# Patient Record
Sex: Female | Born: 1945 | Race: White | Hispanic: No | Marital: Married | Smoking: Former smoker
Health system: Southern US, Community
[De-identification: ages and names within clinical notes are randomized; demographics above are authoritative.]

## PROBLEM LIST (undated history)

## (undated) DIAGNOSIS — D6861 Antiphospholipid syndrome: Secondary | ICD-10-CM

## (undated) DIAGNOSIS — Z8616 Personal history of COVID-19: Secondary | ICD-10-CM

## (undated) DIAGNOSIS — C9590 Leukemia, unspecified not having achieved remission: Secondary | ICD-10-CM

## (undated) HISTORY — DX: Personal history of COVID-19: Z86.16

## (undated) HISTORY — PX: APPENDECTOMY: SHX54

## (undated) HISTORY — PX: TONSILLECTOMY: SHX5217

## (undated) HISTORY — DX: Antiphospholipid syndrome: D68.61

## (undated) HISTORY — PX: CATARACT EXTRACTION: SUR2

## (undated) HISTORY — DX: Leukemia, unspecified not having achieved remission: C95.90

---

## 1997-07-14 ENCOUNTER — Other Ambulatory Visit: Admission: RE | Admit: 1997-07-14 | Discharge: 1997-07-14 | Payer: Self-pay | Admitting: *Deleted

## 2006-03-31 HISTORY — PX: COLON RESECTION: SHX5231

## 2006-10-30 ENCOUNTER — Ambulatory Visit: Payer: Self-pay | Admitting: Oncology

## 2015-01-02 DIAGNOSIS — Z23 Encounter for immunization: Secondary | ICD-10-CM | POA: Diagnosis not present

## 2015-01-31 DIAGNOSIS — M810 Age-related osteoporosis without current pathological fracture: Secondary | ICD-10-CM | POA: Diagnosis not present

## 2015-01-31 DIAGNOSIS — R7301 Impaired fasting glucose: Secondary | ICD-10-CM | POA: Diagnosis not present

## 2015-01-31 DIAGNOSIS — I10 Essential (primary) hypertension: Secondary | ICD-10-CM | POA: Diagnosis not present

## 2015-01-31 DIAGNOSIS — E782 Mixed hyperlipidemia: Secondary | ICD-10-CM | POA: Diagnosis not present

## 2015-01-31 DIAGNOSIS — Z6831 Body mass index (BMI) 31.0-31.9, adult: Secondary | ICD-10-CM | POA: Diagnosis not present

## 2015-01-31 DIAGNOSIS — Z23 Encounter for immunization: Secondary | ICD-10-CM | POA: Diagnosis not present

## 2015-01-31 DIAGNOSIS — Z1231 Encounter for screening mammogram for malignant neoplasm of breast: Secondary | ICD-10-CM | POA: Diagnosis not present

## 2015-05-18 DIAGNOSIS — J018 Other acute sinusitis: Secondary | ICD-10-CM | POA: Diagnosis not present

## 2015-06-04 DIAGNOSIS — M5442 Lumbago with sciatica, left side: Secondary | ICD-10-CM | POA: Diagnosis not present

## 2015-06-04 DIAGNOSIS — M9904 Segmental and somatic dysfunction of sacral region: Secondary | ICD-10-CM | POA: Diagnosis not present

## 2015-06-04 DIAGNOSIS — M5136 Other intervertebral disc degeneration, lumbar region: Secondary | ICD-10-CM | POA: Diagnosis not present

## 2015-06-04 DIAGNOSIS — M9903 Segmental and somatic dysfunction of lumbar region: Secondary | ICD-10-CM | POA: Diagnosis not present

## 2015-06-06 DIAGNOSIS — I10 Essential (primary) hypertension: Secondary | ICD-10-CM | POA: Diagnosis not present

## 2015-06-06 DIAGNOSIS — R7301 Impaired fasting glucose: Secondary | ICD-10-CM | POA: Diagnosis not present

## 2015-06-06 DIAGNOSIS — M5442 Lumbago with sciatica, left side: Secondary | ICD-10-CM | POA: Diagnosis not present

## 2015-06-06 DIAGNOSIS — J3089 Other allergic rhinitis: Secondary | ICD-10-CM | POA: Diagnosis not present

## 2015-06-06 DIAGNOSIS — M5136 Other intervertebral disc degeneration, lumbar region: Secondary | ICD-10-CM | POA: Diagnosis not present

## 2015-06-06 DIAGNOSIS — M545 Low back pain: Secondary | ICD-10-CM | POA: Diagnosis not present

## 2015-06-06 DIAGNOSIS — M9903 Segmental and somatic dysfunction of lumbar region: Secondary | ICD-10-CM | POA: Diagnosis not present

## 2015-06-06 DIAGNOSIS — E782 Mixed hyperlipidemia: Secondary | ICD-10-CM | POA: Diagnosis not present

## 2015-06-06 DIAGNOSIS — M9904 Segmental and somatic dysfunction of sacral region: Secondary | ICD-10-CM | POA: Diagnosis not present

## 2015-06-08 DIAGNOSIS — M5442 Lumbago with sciatica, left side: Secondary | ICD-10-CM | POA: Diagnosis not present

## 2015-06-08 DIAGNOSIS — M9904 Segmental and somatic dysfunction of sacral region: Secondary | ICD-10-CM | POA: Diagnosis not present

## 2015-06-08 DIAGNOSIS — M9903 Segmental and somatic dysfunction of lumbar region: Secondary | ICD-10-CM | POA: Diagnosis not present

## 2015-06-08 DIAGNOSIS — M5136 Other intervertebral disc degeneration, lumbar region: Secondary | ICD-10-CM | POA: Diagnosis not present

## 2015-06-11 DIAGNOSIS — M5136 Other intervertebral disc degeneration, lumbar region: Secondary | ICD-10-CM | POA: Diagnosis not present

## 2015-06-11 DIAGNOSIS — M9904 Segmental and somatic dysfunction of sacral region: Secondary | ICD-10-CM | POA: Diagnosis not present

## 2015-06-11 DIAGNOSIS — M9903 Segmental and somatic dysfunction of lumbar region: Secondary | ICD-10-CM | POA: Diagnosis not present

## 2015-06-11 DIAGNOSIS — M5442 Lumbago with sciatica, left side: Secondary | ICD-10-CM | POA: Diagnosis not present

## 2015-06-13 DIAGNOSIS — M9904 Segmental and somatic dysfunction of sacral region: Secondary | ICD-10-CM | POA: Diagnosis not present

## 2015-06-13 DIAGNOSIS — M5136 Other intervertebral disc degeneration, lumbar region: Secondary | ICD-10-CM | POA: Diagnosis not present

## 2015-06-13 DIAGNOSIS — M9903 Segmental and somatic dysfunction of lumbar region: Secondary | ICD-10-CM | POA: Diagnosis not present

## 2015-06-13 DIAGNOSIS — M5442 Lumbago with sciatica, left side: Secondary | ICD-10-CM | POA: Diagnosis not present

## 2015-06-15 DIAGNOSIS — M5136 Other intervertebral disc degeneration, lumbar region: Secondary | ICD-10-CM | POA: Diagnosis not present

## 2015-06-15 DIAGNOSIS — M9904 Segmental and somatic dysfunction of sacral region: Secondary | ICD-10-CM | POA: Diagnosis not present

## 2015-06-15 DIAGNOSIS — M5442 Lumbago with sciatica, left side: Secondary | ICD-10-CM | POA: Diagnosis not present

## 2015-06-15 DIAGNOSIS — M9903 Segmental and somatic dysfunction of lumbar region: Secondary | ICD-10-CM | POA: Diagnosis not present

## 2015-06-18 DIAGNOSIS — M5136 Other intervertebral disc degeneration, lumbar region: Secondary | ICD-10-CM | POA: Diagnosis not present

## 2015-06-18 DIAGNOSIS — M9903 Segmental and somatic dysfunction of lumbar region: Secondary | ICD-10-CM | POA: Diagnosis not present

## 2015-06-18 DIAGNOSIS — M5442 Lumbago with sciatica, left side: Secondary | ICD-10-CM | POA: Diagnosis not present

## 2015-06-18 DIAGNOSIS — M9904 Segmental and somatic dysfunction of sacral region: Secondary | ICD-10-CM | POA: Diagnosis not present

## 2015-06-22 DIAGNOSIS — M9904 Segmental and somatic dysfunction of sacral region: Secondary | ICD-10-CM | POA: Diagnosis not present

## 2015-06-22 DIAGNOSIS — M5136 Other intervertebral disc degeneration, lumbar region: Secondary | ICD-10-CM | POA: Diagnosis not present

## 2015-06-22 DIAGNOSIS — M9903 Segmental and somatic dysfunction of lumbar region: Secondary | ICD-10-CM | POA: Diagnosis not present

## 2015-06-22 DIAGNOSIS — M5442 Lumbago with sciatica, left side: Secondary | ICD-10-CM | POA: Diagnosis not present

## 2015-06-25 DIAGNOSIS — M9903 Segmental and somatic dysfunction of lumbar region: Secondary | ICD-10-CM | POA: Diagnosis not present

## 2015-06-25 DIAGNOSIS — M9904 Segmental and somatic dysfunction of sacral region: Secondary | ICD-10-CM | POA: Diagnosis not present

## 2015-06-25 DIAGNOSIS — M5136 Other intervertebral disc degeneration, lumbar region: Secondary | ICD-10-CM | POA: Diagnosis not present

## 2015-06-25 DIAGNOSIS — M5442 Lumbago with sciatica, left side: Secondary | ICD-10-CM | POA: Diagnosis not present

## 2015-06-27 DIAGNOSIS — M9904 Segmental and somatic dysfunction of sacral region: Secondary | ICD-10-CM | POA: Diagnosis not present

## 2015-06-27 DIAGNOSIS — M5136 Other intervertebral disc degeneration, lumbar region: Secondary | ICD-10-CM | POA: Diagnosis not present

## 2015-06-27 DIAGNOSIS — M9903 Segmental and somatic dysfunction of lumbar region: Secondary | ICD-10-CM | POA: Diagnosis not present

## 2015-06-27 DIAGNOSIS — M5442 Lumbago with sciatica, left side: Secondary | ICD-10-CM | POA: Diagnosis not present

## 2015-06-29 DIAGNOSIS — M9903 Segmental and somatic dysfunction of lumbar region: Secondary | ICD-10-CM | POA: Diagnosis not present

## 2015-06-29 DIAGNOSIS — M5442 Lumbago with sciatica, left side: Secondary | ICD-10-CM | POA: Diagnosis not present

## 2015-06-29 DIAGNOSIS — M9904 Segmental and somatic dysfunction of sacral region: Secondary | ICD-10-CM | POA: Diagnosis not present

## 2015-06-29 DIAGNOSIS — M5136 Other intervertebral disc degeneration, lumbar region: Secondary | ICD-10-CM | POA: Diagnosis not present

## 2015-07-04 DIAGNOSIS — M5442 Lumbago with sciatica, left side: Secondary | ICD-10-CM | POA: Diagnosis not present

## 2015-07-04 DIAGNOSIS — M5136 Other intervertebral disc degeneration, lumbar region: Secondary | ICD-10-CM | POA: Diagnosis not present

## 2015-07-04 DIAGNOSIS — M9903 Segmental and somatic dysfunction of lumbar region: Secondary | ICD-10-CM | POA: Diagnosis not present

## 2015-07-04 DIAGNOSIS — M9904 Segmental and somatic dysfunction of sacral region: Secondary | ICD-10-CM | POA: Diagnosis not present

## 2015-07-09 DIAGNOSIS — M5136 Other intervertebral disc degeneration, lumbar region: Secondary | ICD-10-CM | POA: Diagnosis not present

## 2015-07-09 DIAGNOSIS — M9903 Segmental and somatic dysfunction of lumbar region: Secondary | ICD-10-CM | POA: Diagnosis not present

## 2015-07-09 DIAGNOSIS — M9904 Segmental and somatic dysfunction of sacral region: Secondary | ICD-10-CM | POA: Diagnosis not present

## 2015-07-09 DIAGNOSIS — M5442 Lumbago with sciatica, left side: Secondary | ICD-10-CM | POA: Diagnosis not present

## 2015-07-11 DIAGNOSIS — M5442 Lumbago with sciatica, left side: Secondary | ICD-10-CM | POA: Diagnosis not present

## 2015-07-11 DIAGNOSIS — M5136 Other intervertebral disc degeneration, lumbar region: Secondary | ICD-10-CM | POA: Diagnosis not present

## 2015-07-11 DIAGNOSIS — M9903 Segmental and somatic dysfunction of lumbar region: Secondary | ICD-10-CM | POA: Diagnosis not present

## 2015-07-11 DIAGNOSIS — M9904 Segmental and somatic dysfunction of sacral region: Secondary | ICD-10-CM | POA: Diagnosis not present

## 2015-10-11 DIAGNOSIS — H6983 Other specified disorders of Eustachian tube, bilateral: Secondary | ICD-10-CM | POA: Diagnosis not present

## 2015-10-11 DIAGNOSIS — R7301 Impaired fasting glucose: Secondary | ICD-10-CM | POA: Diagnosis not present

## 2015-10-11 DIAGNOSIS — E782 Mixed hyperlipidemia: Secondary | ICD-10-CM | POA: Diagnosis not present

## 2015-10-11 DIAGNOSIS — I10 Essential (primary) hypertension: Secondary | ICD-10-CM | POA: Diagnosis not present

## 2016-01-07 DIAGNOSIS — Z23 Encounter for immunization: Secondary | ICD-10-CM | POA: Diagnosis not present

## 2016-02-19 DIAGNOSIS — M791 Myalgia: Secondary | ICD-10-CM | POA: Diagnosis not present

## 2016-02-19 DIAGNOSIS — R7301 Impaired fasting glucose: Secondary | ICD-10-CM | POA: Diagnosis not present

## 2016-02-19 DIAGNOSIS — Z1211 Encounter for screening for malignant neoplasm of colon: Secondary | ICD-10-CM | POA: Diagnosis not present

## 2016-02-19 DIAGNOSIS — E782 Mixed hyperlipidemia: Secondary | ICD-10-CM | POA: Diagnosis not present

## 2016-02-19 DIAGNOSIS — I10 Essential (primary) hypertension: Secondary | ICD-10-CM | POA: Diagnosis not present

## 2016-02-19 DIAGNOSIS — C9110 Chronic lymphocytic leukemia of B-cell type not having achieved remission: Secondary | ICD-10-CM | POA: Diagnosis not present

## 2016-02-19 DIAGNOSIS — M790 Rheumatism, unspecified: Secondary | ICD-10-CM | POA: Diagnosis not present

## 2016-02-19 DIAGNOSIS — Z23 Encounter for immunization: Secondary | ICD-10-CM | POA: Diagnosis not present

## 2016-07-14 DIAGNOSIS — Z1231 Encounter for screening mammogram for malignant neoplasm of breast: Secondary | ICD-10-CM | POA: Diagnosis not present

## 2016-07-14 DIAGNOSIS — N951 Menopausal and female climacteric states: Secondary | ICD-10-CM | POA: Diagnosis not present

## 2016-07-14 DIAGNOSIS — I10 Essential (primary) hypertension: Secondary | ICD-10-CM | POA: Diagnosis not present

## 2016-07-14 DIAGNOSIS — E782 Mixed hyperlipidemia: Secondary | ICD-10-CM | POA: Diagnosis not present

## 2016-07-14 DIAGNOSIS — Z1211 Encounter for screening for malignant neoplasm of colon: Secondary | ICD-10-CM | POA: Diagnosis not present

## 2016-07-14 DIAGNOSIS — R7301 Impaired fasting glucose: Secondary | ICD-10-CM | POA: Diagnosis not present

## 2016-07-14 DIAGNOSIS — C9110 Chronic lymphocytic leukemia of B-cell type not having achieved remission: Secondary | ICD-10-CM | POA: Diagnosis not present

## 2016-10-21 DIAGNOSIS — E782 Mixed hyperlipidemia: Secondary | ICD-10-CM | POA: Diagnosis not present

## 2016-10-21 DIAGNOSIS — R7301 Impaired fasting glucose: Secondary | ICD-10-CM | POA: Diagnosis not present

## 2016-10-21 DIAGNOSIS — I10 Essential (primary) hypertension: Secondary | ICD-10-CM | POA: Diagnosis not present

## 2016-10-21 DIAGNOSIS — R799 Abnormal finding of blood chemistry, unspecified: Secondary | ICD-10-CM | POA: Diagnosis not present

## 2016-10-27 DIAGNOSIS — Z79899 Other long term (current) drug therapy: Secondary | ICD-10-CM | POA: Diagnosis not present

## 2016-10-27 DIAGNOSIS — D6861 Antiphospholipid syndrome: Secondary | ICD-10-CM | POA: Diagnosis not present

## 2016-10-27 DIAGNOSIS — Z7902 Long term (current) use of antithrombotics/antiplatelets: Secondary | ICD-10-CM | POA: Diagnosis not present

## 2016-10-27 DIAGNOSIS — I639 Cerebral infarction, unspecified: Secondary | ICD-10-CM | POA: Diagnosis not present

## 2016-10-27 DIAGNOSIS — C9190 Lymphoid leukemia, unspecified not having achieved remission: Secondary | ICD-10-CM | POA: Diagnosis not present

## 2016-10-27 DIAGNOSIS — C9110 Chronic lymphocytic leukemia of B-cell type not having achieved remission: Secondary | ICD-10-CM | POA: Diagnosis not present

## 2016-10-27 DIAGNOSIS — Z87891 Personal history of nicotine dependence: Secondary | ICD-10-CM | POA: Diagnosis not present

## 2017-01-06 DIAGNOSIS — E038 Other specified hypothyroidism: Secondary | ICD-10-CM | POA: Diagnosis not present

## 2017-01-06 DIAGNOSIS — Z23 Encounter for immunization: Secondary | ICD-10-CM | POA: Diagnosis not present

## 2017-01-27 DIAGNOSIS — M810 Age-related osteoporosis without current pathological fracture: Secondary | ICD-10-CM | POA: Diagnosis not present

## 2017-01-27 DIAGNOSIS — Z1231 Encounter for screening mammogram for malignant neoplasm of breast: Secondary | ICD-10-CM | POA: Diagnosis not present

## 2017-01-27 DIAGNOSIS — Z6830 Body mass index (BMI) 30.0-30.9, adult: Secondary | ICD-10-CM | POA: Diagnosis not present

## 2017-01-27 DIAGNOSIS — I10 Essential (primary) hypertension: Secondary | ICD-10-CM | POA: Diagnosis not present

## 2017-01-27 DIAGNOSIS — R7301 Impaired fasting glucose: Secondary | ICD-10-CM | POA: Diagnosis not present

## 2017-01-27 DIAGNOSIS — Z1211 Encounter for screening for malignant neoplasm of colon: Secondary | ICD-10-CM | POA: Diagnosis not present

## 2017-01-27 DIAGNOSIS — E782 Mixed hyperlipidemia: Secondary | ICD-10-CM | POA: Diagnosis not present

## 2017-01-28 DIAGNOSIS — E876 Hypokalemia: Secondary | ICD-10-CM | POA: Diagnosis not present

## 2017-02-05 DIAGNOSIS — E875 Hyperkalemia: Secondary | ICD-10-CM | POA: Diagnosis not present

## 2017-03-05 DIAGNOSIS — C9110 Chronic lymphocytic leukemia of B-cell type not having achieved remission: Secondary | ICD-10-CM | POA: Diagnosis not present

## 2017-03-05 DIAGNOSIS — D6861 Antiphospholipid syndrome: Secondary | ICD-10-CM | POA: Insufficient documentation

## 2017-03-05 DIAGNOSIS — C9190 Lymphoid leukemia, unspecified not having achieved remission: Secondary | ICD-10-CM | POA: Diagnosis not present

## 2017-03-27 DIAGNOSIS — J018 Other acute sinusitis: Secondary | ICD-10-CM | POA: Diagnosis not present

## 2017-03-27 DIAGNOSIS — G4733 Obstructive sleep apnea (adult) (pediatric): Secondary | ICD-10-CM | POA: Diagnosis not present

## 2017-03-27 DIAGNOSIS — I10 Essential (primary) hypertension: Secondary | ICD-10-CM | POA: Diagnosis not present

## 2017-08-01 DIAGNOSIS — H52223 Regular astigmatism, bilateral: Secondary | ICD-10-CM | POA: Diagnosis not present

## 2017-08-07 DIAGNOSIS — E782 Mixed hyperlipidemia: Secondary | ICD-10-CM | POA: Diagnosis not present

## 2017-08-07 DIAGNOSIS — Z1231 Encounter for screening mammogram for malignant neoplasm of breast: Secondary | ICD-10-CM | POA: Diagnosis not present

## 2017-08-07 DIAGNOSIS — J018 Other acute sinusitis: Secondary | ICD-10-CM | POA: Diagnosis not present

## 2017-08-07 DIAGNOSIS — R7301 Impaired fasting glucose: Secondary | ICD-10-CM | POA: Diagnosis not present

## 2017-08-07 DIAGNOSIS — C9110 Chronic lymphocytic leukemia of B-cell type not having achieved remission: Secondary | ICD-10-CM | POA: Diagnosis not present

## 2017-08-07 DIAGNOSIS — M810 Age-related osteoporosis without current pathological fracture: Secondary | ICD-10-CM | POA: Diagnosis not present

## 2017-08-07 DIAGNOSIS — Z6830 Body mass index (BMI) 30.0-30.9, adult: Secondary | ICD-10-CM | POA: Diagnosis not present

## 2017-08-07 DIAGNOSIS — I10 Essential (primary) hypertension: Secondary | ICD-10-CM | POA: Diagnosis not present

## 2017-10-07 DIAGNOSIS — M5136 Other intervertebral disc degeneration, lumbar region: Secondary | ICD-10-CM | POA: Diagnosis not present

## 2017-10-07 DIAGNOSIS — M5442 Lumbago with sciatica, left side: Secondary | ICD-10-CM | POA: Diagnosis not present

## 2017-10-07 DIAGNOSIS — M9904 Segmental and somatic dysfunction of sacral region: Secondary | ICD-10-CM | POA: Diagnosis not present

## 2017-10-07 DIAGNOSIS — M9903 Segmental and somatic dysfunction of lumbar region: Secondary | ICD-10-CM | POA: Diagnosis not present

## 2017-10-12 DIAGNOSIS — M5136 Other intervertebral disc degeneration, lumbar region: Secondary | ICD-10-CM | POA: Diagnosis not present

## 2017-10-12 DIAGNOSIS — M5442 Lumbago with sciatica, left side: Secondary | ICD-10-CM | POA: Diagnosis not present

## 2017-10-12 DIAGNOSIS — M9903 Segmental and somatic dysfunction of lumbar region: Secondary | ICD-10-CM | POA: Diagnosis not present

## 2017-10-12 DIAGNOSIS — M9904 Segmental and somatic dysfunction of sacral region: Secondary | ICD-10-CM | POA: Diagnosis not present

## 2017-10-13 DIAGNOSIS — C9110 Chronic lymphocytic leukemia of B-cell type not having achieved remission: Secondary | ICD-10-CM | POA: Diagnosis not present

## 2017-10-13 DIAGNOSIS — D6861 Antiphospholipid syndrome: Secondary | ICD-10-CM | POA: Diagnosis not present

## 2017-10-13 DIAGNOSIS — C9190 Lymphoid leukemia, unspecified not having achieved remission: Secondary | ICD-10-CM | POA: Diagnosis not present

## 2017-11-10 DIAGNOSIS — H25013 Cortical age-related cataract, bilateral: Secondary | ICD-10-CM | POA: Diagnosis not present

## 2017-11-10 DIAGNOSIS — H18413 Arcus senilis, bilateral: Secondary | ICD-10-CM | POA: Diagnosis not present

## 2017-11-10 DIAGNOSIS — H25043 Posterior subcapsular polar age-related cataract, bilateral: Secondary | ICD-10-CM | POA: Diagnosis not present

## 2017-11-10 DIAGNOSIS — H2511 Age-related nuclear cataract, right eye: Secondary | ICD-10-CM | POA: Diagnosis not present

## 2017-11-10 DIAGNOSIS — H2513 Age-related nuclear cataract, bilateral: Secondary | ICD-10-CM | POA: Diagnosis not present

## 2017-11-10 DIAGNOSIS — H02831 Dermatochalasis of right upper eyelid: Secondary | ICD-10-CM | POA: Diagnosis not present

## 2017-11-24 DIAGNOSIS — C9110 Chronic lymphocytic leukemia of B-cell type not having achieved remission: Secondary | ICD-10-CM | POA: Diagnosis not present

## 2017-11-24 DIAGNOSIS — M810 Age-related osteoporosis without current pathological fracture: Secondary | ICD-10-CM | POA: Diagnosis not present

## 2017-11-24 DIAGNOSIS — Z6832 Body mass index (BMI) 32.0-32.9, adult: Secondary | ICD-10-CM | POA: Diagnosis not present

## 2017-11-24 DIAGNOSIS — E782 Mixed hyperlipidemia: Secondary | ICD-10-CM | POA: Diagnosis not present

## 2017-11-24 DIAGNOSIS — Z1231 Encounter for screening mammogram for malignant neoplasm of breast: Secondary | ICD-10-CM | POA: Diagnosis not present

## 2017-11-24 DIAGNOSIS — I10 Essential (primary) hypertension: Secondary | ICD-10-CM | POA: Diagnosis not present

## 2017-11-24 DIAGNOSIS — R7301 Impaired fasting glucose: Secondary | ICD-10-CM | POA: Diagnosis not present

## 2017-12-02 DIAGNOSIS — Z23 Encounter for immunization: Secondary | ICD-10-CM | POA: Diagnosis not present

## 2018-01-11 DIAGNOSIS — H52201 Unspecified astigmatism, right eye: Secondary | ICD-10-CM | POA: Diagnosis not present

## 2018-01-11 DIAGNOSIS — H2511 Age-related nuclear cataract, right eye: Secondary | ICD-10-CM | POA: Diagnosis not present

## 2018-03-24 DIAGNOSIS — M769 Unspecified enthesopathy, lower limb, excluding foot: Secondary | ICD-10-CM | POA: Diagnosis not present

## 2018-03-24 DIAGNOSIS — Y998 Other external cause status: Secondary | ICD-10-CM | POA: Diagnosis not present

## 2018-03-24 DIAGNOSIS — M25561 Pain in right knee: Secondary | ICD-10-CM | POA: Diagnosis not present

## 2018-03-24 DIAGNOSIS — M25562 Pain in left knee: Secondary | ICD-10-CM | POA: Diagnosis not present

## 2018-03-24 DIAGNOSIS — M79661 Pain in right lower leg: Secondary | ICD-10-CM | POA: Diagnosis not present

## 2018-03-24 DIAGNOSIS — R52 Pain, unspecified: Secondary | ICD-10-CM | POA: Diagnosis not present

## 2018-03-24 DIAGNOSIS — W108XXA Fall (on) (from) other stairs and steps, initial encounter: Secondary | ICD-10-CM | POA: Diagnosis not present

## 2018-03-24 DIAGNOSIS — R609 Edema, unspecified: Secondary | ICD-10-CM | POA: Diagnosis not present

## 2018-03-24 DIAGNOSIS — S8992XA Unspecified injury of left lower leg, initial encounter: Secondary | ICD-10-CM | POA: Diagnosis not present

## 2018-03-24 DIAGNOSIS — M25461 Effusion, right knee: Secondary | ICD-10-CM | POA: Diagnosis not present

## 2018-03-24 DIAGNOSIS — S8991XA Unspecified injury of right lower leg, initial encounter: Secondary | ICD-10-CM | POA: Diagnosis not present

## 2018-03-24 DIAGNOSIS — M76891 Other specified enthesopathies of right lower limb, excluding foot: Secondary | ICD-10-CM | POA: Diagnosis not present

## 2018-03-30 DIAGNOSIS — S8011XA Contusion of right lower leg, initial encounter: Secondary | ICD-10-CM | POA: Diagnosis not present

## 2018-04-08 ENCOUNTER — Other Ambulatory Visit: Payer: Self-pay | Admitting: Orthopedic Surgery

## 2018-04-08 DIAGNOSIS — R52 Pain, unspecified: Secondary | ICD-10-CM

## 2018-04-08 DIAGNOSIS — R609 Edema, unspecified: Secondary | ICD-10-CM

## 2018-04-12 ENCOUNTER — Ambulatory Visit
Admission: RE | Admit: 2018-04-12 | Discharge: 2018-04-12 | Disposition: A | Discharge status: Home / Self Care | Payer: Medicare HMO | Source: Ambulatory Visit | Attending: Orthopedic Surgery | Admitting: Orthopedic Surgery

## 2018-04-12 DIAGNOSIS — R52 Pain, unspecified: Secondary | ICD-10-CM

## 2018-04-12 DIAGNOSIS — M25561 Pain in right knee: Secondary | ICD-10-CM | POA: Diagnosis not present

## 2018-04-12 DIAGNOSIS — R609 Edema, unspecified: Secondary | ICD-10-CM

## 2018-04-13 DIAGNOSIS — S82124A Nondisplaced fracture of lateral condyle of right tibia, initial encounter for closed fracture: Secondary | ICD-10-CM | POA: Diagnosis not present

## 2018-04-13 DIAGNOSIS — Z9181 History of falling: Secondary | ICD-10-CM | POA: Diagnosis not present

## 2018-04-19 DIAGNOSIS — Z1231 Encounter for screening mammogram for malignant neoplasm of breast: Secondary | ICD-10-CM | POA: Diagnosis not present

## 2018-04-19 DIAGNOSIS — E782 Mixed hyperlipidemia: Secondary | ICD-10-CM | POA: Diagnosis not present

## 2018-04-19 DIAGNOSIS — C9110 Chronic lymphocytic leukemia of B-cell type not having achieved remission: Secondary | ICD-10-CM | POA: Diagnosis not present

## 2018-04-19 DIAGNOSIS — M810 Age-related osteoporosis without current pathological fracture: Secondary | ICD-10-CM | POA: Diagnosis not present

## 2018-04-19 DIAGNOSIS — R7301 Impaired fasting glucose: Secondary | ICD-10-CM | POA: Diagnosis not present

## 2018-04-19 DIAGNOSIS — I10 Essential (primary) hypertension: Secondary | ICD-10-CM | POA: Diagnosis not present

## 2018-04-19 DIAGNOSIS — Z6832 Body mass index (BMI) 32.0-32.9, adult: Secondary | ICD-10-CM | POA: Diagnosis not present

## 2018-05-11 DIAGNOSIS — Z79899 Other long term (current) drug therapy: Secondary | ICD-10-CM | POA: Diagnosis not present

## 2018-05-11 DIAGNOSIS — C9110 Chronic lymphocytic leukemia of B-cell type not having achieved remission: Secondary | ICD-10-CM | POA: Diagnosis not present

## 2018-05-11 DIAGNOSIS — D6861 Antiphospholipid syndrome: Secondary | ICD-10-CM | POA: Diagnosis not present

## 2018-08-03 DIAGNOSIS — Z6832 Body mass index (BMI) 32.0-32.9, adult: Secondary | ICD-10-CM | POA: Diagnosis not present

## 2018-08-03 DIAGNOSIS — C9110 Chronic lymphocytic leukemia of B-cell type not having achieved remission: Secondary | ICD-10-CM | POA: Diagnosis not present

## 2018-08-03 DIAGNOSIS — Z1211 Encounter for screening for malignant neoplasm of colon: Secondary | ICD-10-CM | POA: Diagnosis not present

## 2018-08-03 DIAGNOSIS — I10 Essential (primary) hypertension: Secondary | ICD-10-CM | POA: Diagnosis not present

## 2018-08-03 DIAGNOSIS — R7301 Impaired fasting glucose: Secondary | ICD-10-CM | POA: Diagnosis not present

## 2018-08-03 DIAGNOSIS — Z1231 Encounter for screening mammogram for malignant neoplasm of breast: Secondary | ICD-10-CM | POA: Diagnosis not present

## 2018-08-03 DIAGNOSIS — Z1159 Encounter for screening for other viral diseases: Secondary | ICD-10-CM | POA: Diagnosis not present

## 2018-08-03 DIAGNOSIS — E782 Mixed hyperlipidemia: Secondary | ICD-10-CM | POA: Diagnosis not present

## 2018-11-12 DIAGNOSIS — N3000 Acute cystitis without hematuria: Secondary | ICD-10-CM | POA: Diagnosis not present

## 2018-11-16 DIAGNOSIS — I10 Essential (primary) hypertension: Secondary | ICD-10-CM | POA: Diagnosis not present

## 2018-11-16 DIAGNOSIS — Z1159 Encounter for screening for other viral diseases: Secondary | ICD-10-CM | POA: Diagnosis not present

## 2018-11-16 DIAGNOSIS — Z6832 Body mass index (BMI) 32.0-32.9, adult: Secondary | ICD-10-CM | POA: Diagnosis not present

## 2018-11-16 DIAGNOSIS — C9110 Chronic lymphocytic leukemia of B-cell type not having achieved remission: Secondary | ICD-10-CM | POA: Diagnosis not present

## 2018-11-16 DIAGNOSIS — R7301 Impaired fasting glucose: Secondary | ICD-10-CM | POA: Diagnosis not present

## 2018-11-16 DIAGNOSIS — E782 Mixed hyperlipidemia: Secondary | ICD-10-CM | POA: Diagnosis not present

## 2018-11-16 DIAGNOSIS — Z1211 Encounter for screening for malignant neoplasm of colon: Secondary | ICD-10-CM | POA: Diagnosis not present

## 2018-11-30 DIAGNOSIS — I10 Essential (primary) hypertension: Secondary | ICD-10-CM | POA: Diagnosis not present

## 2018-11-30 DIAGNOSIS — Z23 Encounter for immunization: Secondary | ICD-10-CM | POA: Diagnosis not present

## 2018-11-30 DIAGNOSIS — R7301 Impaired fasting glucose: Secondary | ICD-10-CM | POA: Diagnosis not present

## 2018-11-30 DIAGNOSIS — C9110 Chronic lymphocytic leukemia of B-cell type not having achieved remission: Secondary | ICD-10-CM | POA: Diagnosis not present

## 2018-11-30 DIAGNOSIS — E782 Mixed hyperlipidemia: Secondary | ICD-10-CM | POA: Diagnosis not present

## 2019-03-28 DIAGNOSIS — J018 Other acute sinusitis: Secondary | ICD-10-CM | POA: Diagnosis not present

## 2019-03-30 DIAGNOSIS — J018 Other acute sinusitis: Secondary | ICD-10-CM | POA: Diagnosis not present

## 2019-03-30 DIAGNOSIS — Z7689 Persons encountering health services in other specified circumstances: Secondary | ICD-10-CM | POA: Diagnosis not present

## 2019-04-02 ENCOUNTER — Telehealth: Payer: Self-pay | Admitting: Unknown Physician Specialty

## 2019-04-02 DIAGNOSIS — J1289 Other viral pneumonia: Secondary | ICD-10-CM | POA: Diagnosis not present

## 2019-04-02 DIAGNOSIS — Z452 Encounter for adjustment and management of vascular access device: Secondary | ICD-10-CM | POA: Diagnosis not present

## 2019-04-02 DIAGNOSIS — N179 Acute kidney failure, unspecified: Secondary | ICD-10-CM | POA: Diagnosis not present

## 2019-04-02 DIAGNOSIS — R918 Other nonspecific abnormal finding of lung field: Secondary | ICD-10-CM | POA: Diagnosis not present

## 2019-04-02 DIAGNOSIS — U071 COVID-19: Secondary | ICD-10-CM | POA: Diagnosis not present

## 2019-04-02 DIAGNOSIS — S8992XA Unspecified injury of left lower leg, initial encounter: Secondary | ICD-10-CM | POA: Diagnosis not present

## 2019-04-02 DIAGNOSIS — J9600 Acute respiratory failure, unspecified whether with hypoxia or hypercapnia: Secondary | ICD-10-CM | POA: Diagnosis not present

## 2019-04-02 NOTE — Telephone Encounter (Signed)
Called to discuss with patient about Covid symptoms and the use of bamlanivimab, a monoclonal antibody infusion for those with mild to moderate Covid symptoms and at a high risk of hospitalization.  Pt with symptoms > than 10 days at next infusion date.  Will put in for a cancellation on Monday

## 2019-04-04 ENCOUNTER — Telehealth: Payer: Self-pay | Admitting: Unknown Physician Specialty

## 2019-04-04 DIAGNOSIS — J9600 Acute respiratory failure, unspecified whether with hypoxia or hypercapnia: Secondary | ICD-10-CM | POA: Diagnosis not present

## 2019-04-04 DIAGNOSIS — Z452 Encounter for adjustment and management of vascular access device: Secondary | ICD-10-CM | POA: Diagnosis not present

## 2019-04-04 DIAGNOSIS — R000 Tachycardia, unspecified: Secondary | ICD-10-CM | POA: Diagnosis not present

## 2019-04-04 DIAGNOSIS — N179 Acute kidney failure, unspecified: Secondary | ICD-10-CM | POA: Diagnosis not present

## 2019-04-04 DIAGNOSIS — S8992XA Unspecified injury of left lower leg, initial encounter: Secondary | ICD-10-CM | POA: Diagnosis not present

## 2019-04-04 DIAGNOSIS — J1289 Other viral pneumonia: Secondary | ICD-10-CM | POA: Diagnosis not present

## 2019-04-04 DIAGNOSIS — R0902 Hypoxemia: Secondary | ICD-10-CM | POA: Diagnosis not present

## 2019-04-04 DIAGNOSIS — U071 COVID-19: Secondary | ICD-10-CM | POA: Diagnosis not present

## 2019-04-04 DIAGNOSIS — R918 Other nonspecific abnormal finding of lung field: Secondary | ICD-10-CM | POA: Diagnosis not present

## 2019-04-04 DIAGNOSIS — I10 Essential (primary) hypertension: Secondary | ICD-10-CM | POA: Diagnosis not present

## 2019-04-04 DIAGNOSIS — R531 Weakness: Secondary | ICD-10-CM | POA: Diagnosis not present

## 2019-04-04 NOTE — Telephone Encounter (Signed)
Opening today at the infusion center.  Tried calling and no answer.

## 2019-04-05 DIAGNOSIS — U071 COVID-19: Secondary | ICD-10-CM | POA: Diagnosis not present

## 2019-04-05 DIAGNOSIS — J189 Pneumonia, unspecified organism: Secondary | ICD-10-CM | POA: Diagnosis not present

## 2019-04-05 DIAGNOSIS — Z452 Encounter for adjustment and management of vascular access device: Secondary | ICD-10-CM | POA: Diagnosis not present

## 2019-04-06 ENCOUNTER — Other Ambulatory Visit: Payer: Self-pay

## 2019-04-06 ENCOUNTER — Inpatient Hospital Stay (HOSPITAL_COMMUNITY)
Admission: AD | Admit: 2019-04-06 | Discharge: 2019-04-18 | DRG: 177 | Disposition: A | Discharge status: Home Health | Payer: Medicare HMO | Source: Other Acute Inpatient Hospital | Attending: Internal Medicine | Admitting: Internal Medicine

## 2019-04-06 ENCOUNTER — Inpatient Hospital Stay: Payer: Self-pay

## 2019-04-06 ENCOUNTER — Encounter (HOSPITAL_COMMUNITY): Payer: Self-pay | Admitting: Internal Medicine

## 2019-04-06 DIAGNOSIS — J9601 Acute respiratory failure with hypoxia: Secondary | ICD-10-CM | POA: Diagnosis present

## 2019-04-06 DIAGNOSIS — I480 Paroxysmal atrial fibrillation: Secondary | ICD-10-CM | POA: Diagnosis present

## 2019-04-06 DIAGNOSIS — N179 Acute kidney failure, unspecified: Secondary | ICD-10-CM | POA: Diagnosis present

## 2019-04-06 DIAGNOSIS — R0602 Shortness of breath: Secondary | ICD-10-CM | POA: Diagnosis not present

## 2019-04-06 DIAGNOSIS — J1282 Pneumonia due to coronavirus disease 2019: Secondary | ICD-10-CM | POA: Diagnosis present

## 2019-04-06 DIAGNOSIS — E039 Hypothyroidism, unspecified: Secondary | ICD-10-CM | POA: Diagnosis present

## 2019-04-06 DIAGNOSIS — I248 Other forms of acute ischemic heart disease: Secondary | ICD-10-CM | POA: Diagnosis present

## 2019-04-06 DIAGNOSIS — U071 COVID-19: Principal | ICD-10-CM | POA: Diagnosis present

## 2019-04-06 DIAGNOSIS — E876 Hypokalemia: Secondary | ICD-10-CM | POA: Diagnosis present

## 2019-04-06 DIAGNOSIS — I4891 Unspecified atrial fibrillation: Secondary | ICD-10-CM | POA: Diagnosis present

## 2019-04-06 DIAGNOSIS — E870 Hyperosmolality and hypernatremia: Secondary | ICD-10-CM | POA: Diagnosis present

## 2019-04-06 DIAGNOSIS — I10 Essential (primary) hypertension: Secondary | ICD-10-CM | POA: Diagnosis present

## 2019-04-06 DIAGNOSIS — Z8673 Personal history of transient ischemic attack (TIA), and cerebral infarction without residual deficits: Secondary | ICD-10-CM

## 2019-04-06 DIAGNOSIS — I639 Cerebral infarction, unspecified: Secondary | ICD-10-CM

## 2019-04-06 DIAGNOSIS — C9110 Chronic lymphocytic leukemia of B-cell type not having achieved remission: Secondary | ICD-10-CM | POA: Diagnosis present

## 2019-04-06 HISTORY — DX: Cerebral infarction, unspecified: I63.9

## 2019-04-06 HISTORY — DX: Essential (primary) hypertension: I10

## 2019-04-06 HISTORY — DX: Hypothyroidism, unspecified: E03.9

## 2019-04-06 LAB — HEMOGLOBIN A1C
Hgb A1c MFr Bld: 6.1 % — ABNORMAL HIGH (ref 4.8–5.6)
Mean Plasma Glucose: 128.37 mg/dL

## 2019-04-06 LAB — COMPREHENSIVE METABOLIC PANEL WITH GFR
ALT: 64 U/L — ABNORMAL HIGH (ref 0–44)
AST: 63 U/L — ABNORMAL HIGH (ref 15–41)
Albumin: 3 g/dL — ABNORMAL LOW (ref 3.5–5.0)
Alkaline Phosphatase: 73 U/L (ref 38–126)
Anion gap: 14 (ref 5–15)
BUN: 54 mg/dL — ABNORMAL HIGH (ref 8–23)
CO2: 21 mmol/L — ABNORMAL LOW (ref 22–32)
Calcium: 8.5 mg/dL — ABNORMAL LOW (ref 8.9–10.3)
Chloride: 112 mmol/L — ABNORMAL HIGH (ref 98–111)
Creatinine, Ser: 1.57 mg/dL — ABNORMAL HIGH (ref 0.44–1.00)
GFR calc Af Amer: 37 mL/min — ABNORMAL LOW
GFR calc non Af Amer: 32 mL/min — ABNORMAL LOW
Glucose, Bld: 146 mg/dL — ABNORMAL HIGH (ref 70–99)
Potassium: 3 mmol/L — ABNORMAL LOW (ref 3.5–5.1)
Sodium: 147 mmol/L — ABNORMAL HIGH (ref 135–145)
Total Bilirubin: 0.7 mg/dL (ref 0.3–1.2)
Total Protein: 6.3 g/dL — ABNORMAL LOW (ref 6.5–8.1)

## 2019-04-06 LAB — CBC WITH DIFFERENTIAL/PLATELET
Abs Immature Granulocytes: 0.4 10*3/uL — ABNORMAL HIGH (ref 0.00–0.07)
Basophils Absolute: 0.1 10*3/uL (ref 0.0–0.1)
Basophils Relative: 0 %
Eosinophils Absolute: 0 10*3/uL (ref 0.0–0.5)
Eosinophils Relative: 0 %
HCT: 39.6 % (ref 36.0–46.0)
Hemoglobin: 13.1 g/dL (ref 12.0–15.0)
Immature Granulocytes: 1 %
Lymphocytes Relative: 78 %
Lymphs Abs: 54 10*3/uL — ABNORMAL HIGH (ref 0.7–4.0)
MCH: 30 pg (ref 26.0–34.0)
MCHC: 33.1 g/dL (ref 30.0–36.0)
MCV: 90.6 fL (ref 80.0–100.0)
Monocytes Absolute: 2.5 10*3/uL — ABNORMAL HIGH (ref 0.1–1.0)
Monocytes Relative: 4 %
Neutro Abs: 11.4 10*3/uL — ABNORMAL HIGH (ref 1.7–7.7)
Neutrophils Relative %: 17 %
Platelets: 306 10*3/uL (ref 150–400)
RBC: 4.37 MIL/uL (ref 3.87–5.11)
RDW: 14.6 % (ref 11.5–15.5)
WBC Morphology: ABNORMAL
WBC: 68.3 10*3/uL (ref 4.0–10.5)
nRBC: 0 % (ref 0.0–0.2)

## 2019-04-06 LAB — TYPE AND SCREEN
ABO/RH(D): A POS
Antibody Screen: NEGATIVE

## 2019-04-06 LAB — ABO/RH: ABO/RH(D): A POS

## 2019-04-06 LAB — CK: Total CK: 442 U/L — ABNORMAL HIGH (ref 38–234)

## 2019-04-06 LAB — C-REACTIVE PROTEIN: CRP: 7.9 mg/dL — ABNORMAL HIGH

## 2019-04-06 LAB — PROCALCITONIN: Procalcitonin: 0.31 ng/mL

## 2019-04-06 LAB — GLUCOSE, CAPILLARY
Glucose-Capillary: 148 mg/dL — ABNORMAL HIGH (ref 70–99)
Glucose-Capillary: 169 mg/dL — ABNORMAL HIGH (ref 70–99)
Glucose-Capillary: 158 mg/dL — ABNORMAL HIGH (ref 70–99)

## 2019-04-06 LAB — HEPARIN LEVEL (UNFRACTIONATED): Heparin Unfractionated: 1.42 [IU]/mL — ABNORMAL HIGH (ref 0.30–0.70)

## 2019-04-06 LAB — BRAIN NATRIURETIC PEPTIDE: B Natriuretic Peptide: 151.2 pg/mL — ABNORMAL HIGH (ref 0.0–100.0)

## 2019-04-06 LAB — TROPONIN I (HIGH SENSITIVITY): Troponin I (High Sensitivity): 60 ng/L — ABNORMAL HIGH

## 2019-04-06 LAB — FERRITIN: Ferritin: 2244 ng/mL — ABNORMAL HIGH (ref 11–307)

## 2019-04-06 LAB — D-DIMER, QUANTITATIVE: D-Dimer, Quant: 2.15 ug{FEU}/mL — ABNORMAL HIGH (ref 0.00–0.50)

## 2019-04-06 MED ORDER — GUAIFENESIN-DM 100-10 MG/5ML PO SYRP: 10.0000 mL | ORAL_SOLUTION | ORAL | Status: DC | PRN

## 2019-04-06 MED ORDER — ACETAMINOPHEN 325 MG PO TABS
650.0000 mg | ORAL_TABLET | Freq: Four times a day (QID) | ORAL | Status: DC | PRN
  Filled 2019-04-06: qty 2

## 2019-04-06 MED ORDER — SODIUM CHLORIDE 0.9% FLUSH: 3.0000 mL | INTRAVENOUS | Status: DC | PRN

## 2019-04-06 MED ORDER — SODIUM CHLORIDE 0.9 % IV SOLN: 100.0000 mg | Freq: Every day | INTRAVENOUS | Status: DC

## 2019-04-06 MED ORDER — SODIUM CHLORIDE 0.9 % IV SOLN: 200.0000 mg | Freq: Once | INTRAVENOUS | Status: DC

## 2019-04-06 MED ORDER — AZITHROMYCIN 250 MG PO TABS
500.0000 mg | ORAL_TABLET | Freq: Every day | ORAL | Status: AC
  Administered 2019-04-06: 500 mg via ORAL
  Filled 2019-04-06: qty 2

## 2019-04-06 MED ORDER — MENTHOL 3 MG MT LOZG
1.0000 | LOZENGE | OROMUCOSAL | Status: DC | PRN
  Filled 2019-04-06: qty 9

## 2019-04-06 MED ORDER — POLYETHYLENE GLYCOL 3350 17 G PO PACK: 17.0000 g | PACK | Freq: Every day | ORAL | Status: DC | PRN

## 2019-04-06 MED ORDER — DILTIAZEM HCL 50 MG/10ML IV SOLN
10.0000 mg | Freq: Once | INTRAVENOUS | Status: AC
  Administered 2019-04-06: 10 mg via INTRAVENOUS
  Filled 2019-04-06: qty 5

## 2019-04-06 MED ORDER — OXYMETAZOLINE HCL 0.05 % NA SOLN
3.0000 | Freq: Two times a day (BID) | NASAL | Status: DC | PRN
  Administered 2019-04-10: 09:00:00 3 via NASAL
  Filled 2019-04-06: qty 15

## 2019-04-06 MED ORDER — SODIUM CHLORIDE 0.9 % IV SOLN
100.0000 mg | Freq: Every day | INTRAVENOUS | Status: AC
  Administered 2019-04-06 – 2019-04-08 (×3): 100 mg via INTRAVENOUS
  Filled 2019-04-06 (×5): qty 20

## 2019-04-06 MED ORDER — SALINE SPRAY 0.65 % NA SOLN
1.0000 | NASAL | Status: DC | PRN
  Administered 2019-04-11: 04:00:00 1 via NASAL
  Filled 2019-04-06: qty 44

## 2019-04-06 MED ORDER — SODIUM CHLORIDE 0.45 % IV SOLN: INTRAVENOUS | Status: AC

## 2019-04-06 MED ORDER — POTASSIUM CHLORIDE CRYS ER 20 MEQ PO TBCR
40.0000 meq | EXTENDED_RELEASE_TABLET | Freq: Once | ORAL | Status: AC
  Administered 2019-04-06: 40 meq via ORAL
  Filled 2019-04-06: qty 2

## 2019-04-06 MED ORDER — SODIUM CHLORIDE 0.9 % IV SOLN
1.0000 g | INTRAVENOUS | Status: AC
  Administered 2019-04-06 – 2019-04-08 (×3): 1 g via INTRAVENOUS
  Filled 2019-04-06 (×3): qty 10

## 2019-04-06 MED ORDER — ALBUTEROL SULFATE HFA 108 (90 BASE) MCG/ACT IN AERS
2.0000 | INHALATION_SPRAY | Freq: Four times a day (QID) | RESPIRATORY_TRACT | Status: DC
  Administered 2019-04-06 – 2019-04-18 (×46): 2 via RESPIRATORY_TRACT
  Filled 2019-04-06: qty 6.7

## 2019-04-06 MED ORDER — METHYLPREDNISOLONE SODIUM SUCC 40 MG IJ SOLR
40.0000 mg | Freq: Two times a day (BID) | INTRAMUSCULAR | Status: DC
  Administered 2019-04-06 – 2019-04-12 (×13): 40 mg via INTRAVENOUS
  Filled 2019-04-06 (×13): qty 1

## 2019-04-06 MED ORDER — SODIUM CHLORIDE 0.9% FLUSH
3.0000 mL | Freq: Two times a day (BID) | INTRAVENOUS | Status: DC
  Administered 2019-04-06 – 2019-04-16 (×19): 3 mL via INTRAVENOUS

## 2019-04-06 MED ORDER — DILTIAZEM HCL-DEXTROSE 125-5 MG/125ML-% IV SOLN (PREMIX)
5.0000 mg/h | INTRAVENOUS | Status: DC
  Administered 2019-04-06: 5 mg/h via INTRAVENOUS
  Administered 2019-04-07: 10 mg/h via INTRAVENOUS
  Filled 2019-04-06 (×2): qty 125

## 2019-04-06 MED ORDER — HEPARIN (PORCINE) 25000 UT/250ML-% IV SOLN
1150.0000 [IU]/h | INTRAVENOUS | Status: DC
  Administered 2019-04-06: 1150 [IU]/h via INTRAVENOUS
  Filled 2019-04-06: qty 250

## 2019-04-06 MED ORDER — PANTOPRAZOLE SODIUM 40 MG PO TBEC
40.0000 mg | DELAYED_RELEASE_TABLET | Freq: Every day | ORAL | Status: DC
  Administered 2019-04-06 – 2019-04-18 (×13): 40 mg via ORAL
  Filled 2019-04-06 (×13): qty 1

## 2019-04-06 MED ORDER — HYDROCOD POLST-CPM POLST ER 10-8 MG/5ML PO SUER: 5.0000 mL | Freq: Two times a day (BID) | ORAL | Status: DC | PRN

## 2019-04-06 MED ORDER — INSULIN ASPART 100 UNIT/ML ~~LOC~~ SOLN
0.0000 [IU] | Freq: Three times a day (TID) | SUBCUTANEOUS | Status: DC
  Administered 2019-04-06: 2 [IU] via SUBCUTANEOUS
  Administered 2019-04-07 (×2): 1 [IU] via SUBCUTANEOUS
  Administered 2019-04-07 – 2019-04-08 (×2): 2 [IU] via SUBCUTANEOUS
  Administered 2019-04-08: 3 [IU] via SUBCUTANEOUS
  Administered 2019-04-08 – 2019-04-09 (×2): 1 [IU] via SUBCUTANEOUS
  Administered 2019-04-09 – 2019-04-11 (×3): 2 [IU] via SUBCUTANEOUS
  Administered 2019-04-12 (×2): 1 [IU] via SUBCUTANEOUS
  Administered 2019-04-14: 3 [IU] via SUBCUTANEOUS
  Administered 2019-04-14: 1 [IU] via SUBCUTANEOUS

## 2019-04-06 MED ORDER — LORAZEPAM 2 MG/ML IJ SOLN
1.0000 mg | Freq: Once | INTRAMUSCULAR | Status: AC
  Administered 2019-04-06: 1 mg via INTRAVENOUS
  Filled 2019-04-06: qty 1

## 2019-04-06 MED ORDER — ZINC SULFATE 220 (50 ZN) MG PO CAPS
220.0000 mg | ORAL_CAPSULE | Freq: Every day | ORAL | Status: DC
  Administered 2019-04-06 – 2019-04-18 (×13): 220 mg via ORAL
  Filled 2019-04-06 (×15): qty 1

## 2019-04-06 MED ORDER — SODIUM CHLORIDE 0.9% FLUSH
3.0000 mL | Freq: Two times a day (BID) | INTRAVENOUS | Status: DC
  Administered 2019-04-06 – 2019-04-16 (×16): 3 mL via INTRAVENOUS

## 2019-04-06 MED ORDER — SODIUM CHLORIDE 0.9 % IV SOLN
250.0000 mL | INTRAVENOUS | Status: DC | PRN
  Administered 2019-04-06: 250 mL via INTRAVENOUS

## 2019-04-06 MED ORDER — LEVOTHYROXINE SODIUM 50 MCG PO TABS
50.0000 ug | ORAL_TABLET | Freq: Every day | ORAL | Status: DC
  Administered 2019-04-07 – 2019-04-18 (×12): 50 ug via ORAL
  Filled 2019-04-06 (×12): qty 1

## 2019-04-06 MED ORDER — HEPARIN (PORCINE) 25000 UT/250ML-% IV SOLN: 1150.0000 [IU]/h | INTRAVENOUS | Status: DC

## 2019-04-06 MED ORDER — METOPROLOL TARTRATE 25 MG PO TABS
50.0000 mg | ORAL_TABLET | Freq: Two times a day (BID) | ORAL | Status: DC
  Administered 2019-04-06 – 2019-04-07 (×4): 50 mg via ORAL
  Filled 2019-04-06 (×4): qty 2

## 2019-04-06 MED ORDER — HEPARIN BOLUS VIA INFUSION
4500.0000 [IU] | Freq: Once | INTRAVENOUS | Status: DC
  Filled 2019-04-06: qty 4500

## 2019-04-06 MED ORDER — ONDANSETRON HCL 4 MG/2ML IJ SOLN: 4.0000 mg | Freq: Four times a day (QID) | INTRAMUSCULAR | Status: DC | PRN

## 2019-04-06 MED ORDER — ONDANSETRON HCL 4 MG PO TABS: 4.0000 mg | ORAL_TABLET | Freq: Four times a day (QID) | ORAL | Status: DC | PRN

## 2019-04-06 MED ORDER — FLUTICASONE PROPIONATE 50 MCG/ACT NA SUSP
2.0000 | Freq: Every day | NASAL | Status: DC
  Administered 2019-04-07 – 2019-04-18 (×12): 2 via NASAL
  Filled 2019-04-06: qty 16

## 2019-04-06 MED ORDER — ENOXAPARIN SODIUM 40 MG/0.4ML ~~LOC~~ SOLN: 40.0000 mg | SUBCUTANEOUS | Status: DC

## 2019-04-06 MED ORDER — CLOPIDOGREL BISULFATE 75 MG PO TABS
75.0000 mg | ORAL_TABLET | Freq: Every day | ORAL | Status: DC
  Administered 2019-04-06 – 2019-04-07 (×2): 75 mg via ORAL
  Filled 2019-04-06 (×2): qty 1

## 2019-04-06 MED ORDER — HEPARIN (PORCINE) 25000 UT/250ML-% IV SOLN
850.0000 [IU]/h | INTRAVENOUS | Status: DC
  Administered 2019-04-06 – 2019-04-07 (×2): 950 [IU]/h via INTRAVENOUS
  Filled 2019-04-06 (×2): qty 250

## 2019-04-06 MED ORDER — DILTIAZEM HCL 25 MG/5ML IV SOLN
10.0000 mg | INTRAVENOUS | Status: DC | PRN
  Administered 2019-04-07 – 2019-04-10 (×3): 10 mg via INTRAVENOUS
  Filled 2019-04-06 (×6): qty 5

## 2019-04-06 MED ORDER — FOLIC ACID 1 MG PO TABS
1.0000 mg | ORAL_TABLET | Freq: Every day | ORAL | Status: DC
  Administered 2019-04-06 – 2019-04-18 (×13): 1 mg via ORAL
  Filled 2019-04-06 (×13): qty 1

## 2019-04-06 MED ORDER — ASCORBIC ACID 500 MG PO TABS
500.0000 mg | ORAL_TABLET | Freq: Every day | ORAL | Status: DC
  Administered 2019-04-06 – 2019-04-18 (×13): 500 mg via ORAL
  Filled 2019-04-06 (×13): qty 1

## 2019-04-06 NOTE — Sepsis Progress Note (Signed)
Notified Charge nurse on sepsis score. Pt on 15L HF and 15 NRB sat 93%. Tachy breathing while talking on phone. Denies discomfort. MD at beside. See new orders.

## 2019-04-06 NOTE — Progress Notes (Signed)
[place] for heparin Indication: atrial fibrillation  Heparin Dosing Weight: 76.4 kg  Labs: Recent Labs    04/06/19 1000 04/06/19 1845  HGB 13.1  --   HCT 39.6  --   PLT 306  --   HEPARINUNFRC  --  1.42*  CREATININE 1.57*  --   CKTOTAL 442*  --     Assessment: 73 yof COVID-19 positive on 03/30/19 presenting with afib with RVR. Pharmacy consulted to dose heparin. Patient is not on anticoagulation PTA but was on Lovenox 80mg /day (for CrCl<30 initially) at [facility] prior to transfer, last dose 1/5 at 1630 per shadow chart. Lovenox prophylactic dose ordered at [facility] 1/6 but d/c'd prior to being given. SCr trended down from 2.2>1.8 at [facility]. Labs still pending on transfer. No active bleed issues documented. Will start heparin now, as has been 12 hours since last Lovenox dose and last CrCl improved to >30.  Heparin level came back at 1.42 tonight. This is probably from the residual lovenox dose that she got yesterday at 1541 at [facility]. She does have poor renal function.   Goal of Therapy:  Heparin level 0.3-0.7 units/ml Monitor platelets by anticoagulation protocol: Yes   Plan:  Hold heparin x 2 hrs Decrease heparin to 950 units/h Check heparin level in AM Monitor daily heparin level and CBC, s/sx bleeding   Elicia Lamp, PharmD, BCPS Clinical Pharmacist 04/06/2019 9:14 PM

## 2019-04-06 NOTE — Progress Notes (Signed)
[place] for heparin Indication: atrial fibrillation  Heparin Dosing Weight: 76.4 kg  Labs: No results for input(s): HGB, HCT, PLT, APTT, LABPROT, INR, HEPARINUNFRC, HEPRLOWMOCWT, CREATININE, CKTOTAL, CKMB, TROPONINI in the last 72 hours.  Assessment: 73 yof COVID-19 positive on 03/30/19 presenting with afib with RVR. Pharmacy consulted to dose heparin. Patient is not on anticoagulation PTA but was on Lovenox 80mg /day (for CrCl<30 initially) at [facility] prior to transfer, last dose 1/5 at 1630 per shadow chart. Lovenox prophylactic dose ordered at [facility] 1/6 but d/c'd prior to being given. SCr trended down from 2.2>1.8 at [facility]. Labs still pending on transfer. No active bleed issues documented. Will start heparin now, as has been 12 hours since last Lovenox dose and last CrCl improved to >30.  Goal of Therapy:  Heparin level 0.3-0.7 units/ml Monitor platelets by anticoagulation protocol: Yes   Plan:  No bolus with recent Lovenox. Start heparin at 1150 units/h 8h heparin level Monitor daily heparin level and CBC, s/sx bleeding   Elicia Lamp, PharmD, BCPS Clinical Pharmacist 04/06/2019 8:42 AM

## 2019-04-06 NOTE — Sepsis Progress Note (Signed)
Interventions for sepsis: Cardizem drip started at 5mg . Awaiting heparin from pharm.   Pt denies pain and states, " I actually feel better" and appears comfortable. Pt able to talk to phone to family.   Pt objectively displaying tachy breathing, pulse elevated and O2 sat 88%-91% on both HF and NRB.

## 2019-04-06 NOTE — Plan of Care (Signed)
  Problem: Education: Goal: Knowledge of risk factors and measures for prevention of condition will improve Outcome: Progressing   Problem: Coping: Goal: Psychosocial and spiritual needs will be supported Outcome: Progressing   Problem: Respiratory: Goal: Will maintain a patent airway Outcome: Progressing Goal: Complications related to the disease process, condition or treatment will be avoided or minimized Outcome: Progressing   Problem: Education: Goal: Knowledge of General Education information will improve Description: Including pain rating scale, medication(s)/side effects and non-pharmacologic comfort measures Outcome: Progressing   Problem: Health Behavior/Discharge Planning: Goal: Ability to manage health-related needs will improve Outcome: Progressing   Problem: Clinical Measurements: Goal: Ability to maintain clinical measurements within normal limits will improve Outcome: Progressing Goal: Will remain free from infection Outcome: Progressing Goal: Diagnostic test results will improve Outcome: Progressing Goal: Respiratory complications will improve Outcome: Progressing Goal: Cardiovascular complication will be avoided Outcome: Progressing   Problem: Activity: Goal: Risk for activity intolerance will decrease Outcome: Progressing   Problem: Nutrition: Goal: Adequate nutrition will be maintained Outcome: Progressing   Problem: Coping: Goal: Level of anxiety will decrease Outcome: Progressing   Problem: Elimination: Goal: Will not experience complications related to bowel motility Outcome: Progressing Goal: Will not experience complications related to urinary retention Outcome: Progressing   Problem: Pain Managment: Goal: General experience of comfort will improve Outcome: Progressing   Problem: Safety: Goal: Ability to remain free from injury will improve Outcome: Progressing   Problem: Skin Integrity: Goal: Risk for impaired skin integrity will  decrease Outcome: Progressing   Problem: Education: Goal: Knowledge of risk factors and measures for prevention of condition will improve Outcome: Progressing   Problem: Coping: Goal: Psychosocial and spiritual needs will be supported Outcome: Progressing   Problem: Respiratory: Goal: Will maintain a patent airway Outcome: Progressing Goal: Complications related to the disease process, condition or treatment will be avoided or minimized Outcome: Progressing   Problem: Education: Goal: Knowledge of General Education information will improve Description: Including pain rating scale, medication(s)/side effects and non-pharmacologic comfort measures Outcome: Progressing   Problem: Health Behavior/Discharge Planning: Goal: Ability to manage health-related needs will improve Outcome: Progressing   Problem: Clinical Measurements: Goal: Ability to maintain clinical measurements within normal limits will improve Outcome: Progressing Goal: Will remain free from infection Outcome: Progressing Goal: Diagnostic test results will improve Outcome: Progressing Goal: Respiratory complications will improve Outcome: Progressing Goal: Cardiovascular complication will be avoided Outcome: Progressing   Problem: Activity: Goal: Risk for activity intolerance will decrease Outcome: Progressing   Problem: Nutrition: Goal: Adequate nutrition will be maintained Outcome: Progressing   Problem: Coping: Goal: Level of anxiety will decrease Outcome: Progressing   Problem: Elimination: Goal: Will not experience complications related to bowel motility Outcome: Progressing Goal: Will not experience complications related to urinary retention Outcome: Progressing   Problem: Pain Managment: Goal: General experience of comfort will improve Outcome: Progressing   Problem: Safety: Goal: Ability to remain free from injury will improve Outcome: Progressing   Problem: Skin Integrity: Goal: Risk  for impaired skin integrity will decrease Outcome: Progressing   

## 2019-04-06 NOTE — Sepsis Progress Note (Signed)
Both RRT and ICU charge visited pt.

## 2019-04-06 NOTE — H&P (Addendum)
HISTORY AND PHYSICAL       PATIENT DETAILS Name: Cynthia Gardner Age: 74 y.o. Sex: female Date of Birth: Aug 17, 1945 Admit Date: 04/06/2019 PCP:Cox, Elnita Maxwell, MD   Patient coming from: Transfer from [place]:  Shortness of breath  HPI: Cynthia Gardner is a 74 y.o. 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  She was found to be profoundly hypoxemic-requiring 100% NRB-and was started on steroids, remdesivir-she was also given 1 dose of Actemra.  She was also found to have atrial fibrillation with RVR.  She was subsequently transferred to the [facility] at [facility] for further evaluation and treatment.  Per patient-she does not have any known exposure to COVID-19-she claims that starting this past Sunday she started getting short of breath.  However for the past 1 week was feeling fatigued, and having subjective fever along with myalgias.  She currently denies any chest pain, nausea, vomiting or diarrhea.  Note: Lives at: Home Mobility:  Independent Chronic Indwelling Foley:no  REVIEW OF SYSTEMS:  Constitutional:   No  weight loss, night sweats  HEENT:    No headaches, Dysphagia,Tooth/dental problems,Sore throat,   Cardio-vascular: No chest pain,Orthopnea, PND,lower extremity edema, anasarca, palpitations  GI:  No heartburn, indigestion, abdominal pain, nausea, vomiting, diarrhea, melena or hematochezia  Resp: No  hemoptysis,plueritic chest pain.   Skin:  No rash or lesions.  GU:  No dysuria, change in color of urine, no urgency or frequency.  No flank pain.  Musculoskeletal: No joint pain or swelling.  No decreased range of motion.  No back pain.  Endocrine: No heat  intolerance, no cold intolerance, no polyuria, no polydipsia  Psych: No change in mood or affect. No depression or anxiety.  No memory loss.   ALLERGIES:  Not on File  PAST MEDICAL HISTORY: Past Medical History:  Diagnosis Date  . CVA (cerebral vascular accident) ([place]) 04/06/2019  . HTN (hypertension) 04/06/2019  . Hypothyroidism 04/06/2019    PAST SURGICAL HISTORY: Partial small bowel resection  MEDICATIONS AT HOME: Prior to Admission medications   Not on File    FAMILY HISTORY: Denies any family history of CAD   SOCIAL HISTORY:  has no history on file for tobacco, alcohol, and drug.  PHYSICAL EXAM: Blood pressure (!) 149/76, pulse 63, temperature 99.3 F (37.4 C), temperature source Axillary, resp. rate (!) 45, height 5\' 6"  (1.676 m), weight 81.6 kg, SpO2 93 %.  General appearance :Awake, alert, not in any distress.  Eyes:, pupils equally reactive to light and accomodation,no scleral icterus.Pink conjunctiva HEENT: Atraumatic and Normocephalic Neck: supple, no JVD.  Resp:Good air entry bilaterally, bibasilar rales CVS: S1 S2 regular, no murmurs.  GI: Bowel sounds present, Non tender and not distended with no gaurding, rigidity or rebound. Extremities: B/L Lower Ext shows no edema, both legs are warm to touch Neurology:  speech clear,Non focal, sensation is grossly intact. Psychiatric: Normal judgment and insight. Alert and oriented x 3. Normal mood. Musculoskeletal:gait appears to be normal.No digital cyanosis Skin:No Rash, warm and dry Wounds:N/A  LABS ON ADMISSION:  I have personally reviewed following labs and imaging studies  CBC: No results for input(s): WBC, NEUTROABS, HGB, HCT, MCV, PLT in the last 168 hours.  Basic Metabolic Panel: No results for input(s): NA, K, CL, CO2, GLUCOSE, BUN, CREATININE, CALCIUM, MG, PHOS in the last 168 hours.  GFR: CrCl cannot be calculated (No successful lab value found.).  Liver Function Tests: No results for input(s):  AST, ALT, ALKPHOS, BILITOT, PROT, ALBUMIN in the last 168 hours. No results for input(s): LIPASE, AMYLASE in the last 168 hours. No results for input(s): AMMONIA in the last 168 hours.  Coagulation Profile: No results for input(s): INR, PROTIME in the last 168 hours.  Cardiac Enzymes: No results for input(s): CKTOTAL, CKMB, CKMBINDEX, TROPONINI in the last 168 hours.  BNP (last 3 results) No results for input(s): PROBNP in the last 8760 hours.  HbA1C: No results for input(s): HGBA1C in the last 72 hours.  CBG: No results for input(s): GLUCAP in the last 168 hours.  Lipid Profile: No results for input(s): CHOL, HDL, LDLCALC, TRIG, CHOLHDL, LDLDIRECT in the last 72 hours.  Thyroid Function Tests: No results for input(s): TSH, T4TOTAL, FREET4, T3FREE, THYROIDAB in the last 72 hours.  Anemia Panel: No results for input(s): VITAMINB12, FOLATE, FERRITIN, TIBC, IRON, RETICCTPCT in the last 72 hours.  Urine analysis: No results found for: COLORURINE, APPEARANCEUR, LABSPEC, PHURINE, GLUCOSEU, HGBUR, BILIRUBINUR, KETONESUR, PROTEINUR, UROBILINOGEN, NITRITE, LEUKOCYTESUR  Sepsis Labs: Lactic Acid, Venous No results found for: [place]   Microbiology: No results found for this or any previous visit (from the past 240 hour(s)).    RADIOLOGIC STUDIES ON ADMISSION: No results found.  I have personally reviewed images of chest xray: Diffuse bilateral interstitial infiltrates  EKG:  Personally reviewed.  A. fib with RVR  ASSESSMENT AND PLAN: Acute hypoxemic respiratory failure secondary to COVID-19 pneumonia: Was started on steroids and remdesivir at [facility] will be continued.  She was already given 1 dose of Actemra at [facility]  Currently on both HFNC and 100% nonrebreather mask-we will encourage incentive spirometry, flutter valve-in prone position.  Although profoundly hypoxic-she appears comfortable.  Long discussion with patient-she is currently a  full code.  Although she has a minimally elevated procalcitonin level-I doubt she has concurrent bacterial pneumonia-however given severity of hypoxemia-it is not unreasonable to continue with Rocephin/Zithromax.  COVID-19 medications: Remdesivir: 1/4>> Steroids: 1/4>> Actemra: 1/5 at [facility]  Other medications: Rocephin:1/4>> (plan total 5 days) Zithromax:1/4>>1/6  Atrial fibrillation with RVR: Likely provoked by severe hypoxemia and COVID-19 pneumonia.  Start oral beta-blocker-if heart rate still uncontrolled-we will start Cardizem infusion.  Given her history of hypertension, and prior history of CVA-we will go ahead and start her on full dose anticoagulation with IV heparin.  Minimally elevated troponin: Trend is flat per documentation from [facility]-likely secondary demand ischemia.  Note-04/04/18 TTE: EF 60-65%-with no regional wall motion abnormality (done at [place])  Hypokalemia: Replete and recheck  Hypernatremia: Mild-we will gently hydrate with half-normal saline-recheck tomorrow.  AKI: Likely hemodynamically mediated-follow.  Transaminitis: Likely secondary to COVID-19-appears mild-follow for now.  Hypothyroidism: Continue Synthroid  History of CVA: Continue Plavix-we will also start on IV heparin and given atrial fibrillation.  Echo as noted above.  History of CLL: Per documentation from [place]- baseline WBC count around 20-25,000-currently significantly more than baseline as patient on steroids.  Will follow closely.  Note:  1.Not sure why patient has a Foley catheter in place-we will go ahead and discontinue and monitor closely.  2.Patient has a right IJ placed in the ED at [facility] will ask IV team to see if we can place peripheral IV and see if we can  discontinue the central line.  Further plan will depend as patient's clinical course evolves and further radiologic and laboratory data become available. Patient will be monitored  closely.   CONSULTS: None  DVT Prophylaxis: Full dose heparin  Code Status: Full Code  Disposition Plan:  Discharge back home vs SNF possibly in 5-7 days, depending on clinical course  Admission status: Inpatient  going to SDU  The medical decision making on this patient was of high complexity and the patient is at high risk for clinical deterioration, therefore this is a level 3 visit.   Total time spent  55 minutes.Greater than 50% of this time was spent in counseling, explanation of diagnosis, planning of further management, and coordination of care.  Severity of illness: The appropriate patient status for this patient is INPATIENT. Inpatient status is judged to be reasonable and necessary in order to provide the required intensity of service to ensure the patient's safety. The patient's presenting symptoms, physical exam findings, and initial radiographic and laboratory data in the context of their chronic comorbidities is felt to place them at high risk for further clinical deterioration. Furthermore, it is not anticipated that the patient will be medically stable for discharge from the hospital within 2 midnights of admission. The following factors support the patient status of inpatient.   " The patient's presenting symptoms include shortness of breath " The worrisome physical exam findings include severe hypoxemia " The initial radiographic and laboratory data are worrisome because of bilateral infiltrates, COVID-19 positive " The chronic co-morbidities include CLL, CVA, HTN  * I certify that at the point of admission it is my clinical judgment that the patient will require inpatient hospital care spanning beyond 2 midnights from the point of admission due to high intensity of service, high risk for further deterioration and high frequency of surveillance required.**  Channa Hazelett [facility] Pager [phone]  If 7PM-7AM, please contact night-coverage  Please  page via [email or web address]  Go to [email or web address] and use [place]'s universal password to access. If you do not have the password, please contact the hospital operator.  Locate the [facility] provider you are looking for under [facility] and page to a number that you can be directly reached. If you still have difficulty reaching the provider, please page the [facility] (Director on Call) for the Hospitalists listed on amion for assistance.  04/06/2019, 7:21 AM

## 2019-04-06 NOTE — Progress Notes (Addendum)
Medication related note:  Pt received Remdesivir 200mg  on 04/04/19 and 100mg  on 04/05/19; re-entered order to reflect for a total of 5 doses (3 more 100mg  doses).  Kathleene Hazel, RPh Clinical pharmacist

## 2019-04-06 NOTE — Progress Notes (Signed)
0635- Patient arrived to [facility].  0645 Dr. Shanon Brow paged and notified of patient in Afib with rate 120-140s, O2 sat 89% on 15L NRB, RR 45-50, patient is asking for cpap machine, patient stated "the other hospital said you would have the cpap machine ready for me", page returned and orders received.

## 2019-04-06 NOTE — Progress Notes (Signed)
PICC RN went to place PICC per MD order.  PICC RN begin explaining procedure to patient.  Patient verbalized "they had a hard time getting this line in my neck without numbing me"  PICC RN explained the importance of having a PICC line placed ASAP and removal of the current CVC ( that was placed in [place] ED).  Patient continued to say " well I just wanna wait because its working fine now"  Patient RN educated patient several times on the importance of PICC line. Patient current R TL IJ infusing with no current issues.  Patient agreed to have PICC RN "check" to see if any veins were any good.  PICC RN assessed RUA via ultrasound and noted basilic vein measuring [id number] for catheter placement.  Patient continued to say "I dont want it right now"  Patient RN and MD notified.

## 2019-04-07 DIAGNOSIS — J1282 Pneumonia due to coronavirus disease 2019: Secondary | ICD-10-CM

## 2019-04-07 DIAGNOSIS — U071 COVID-19: Principal | ICD-10-CM

## 2019-04-07 LAB — COMPREHENSIVE METABOLIC PANEL WITH GFR
ALT: 63 U/L — ABNORMAL HIGH (ref 0–44)
AST: 63 U/L — ABNORMAL HIGH (ref 15–41)
Albumin: 3.2 g/dL — ABNORMAL LOW (ref 3.5–5.0)
Alkaline Phosphatase: 75 U/L (ref 38–126)
Anion gap: 14 (ref 5–15)
BUN: 50 mg/dL — ABNORMAL HIGH (ref 8–23)
CO2: 23 mmol/L (ref 22–32)
Calcium: 8.5 mg/dL — ABNORMAL LOW (ref 8.9–10.3)
Chloride: 109 mmol/L (ref 98–111)
Creatinine, Ser: 1.47 mg/dL — ABNORMAL HIGH (ref 0.44–1.00)
GFR calc Af Amer: 40 mL/min — ABNORMAL LOW
GFR calc non Af Amer: 35 mL/min — ABNORMAL LOW
Glucose, Bld: 169 mg/dL — ABNORMAL HIGH (ref 70–99)
Potassium: 3.1 mmol/L — ABNORMAL LOW (ref 3.5–5.1)
Sodium: 146 mmol/L — ABNORMAL HIGH (ref 135–145)
Total Bilirubin: 0.6 mg/dL (ref 0.3–1.2)
Total Protein: 6.4 g/dL — ABNORMAL LOW (ref 6.5–8.1)

## 2019-04-07 LAB — CBC WITH DIFFERENTIAL/PLATELET
Abs Immature Granulocytes: 0.33 10*3/uL — ABNORMAL HIGH (ref 0.00–0.07)
Basophils Absolute: 0.2 10*3/uL — ABNORMAL HIGH (ref 0.0–0.1)
Basophils Relative: 0 %
Eosinophils Absolute: 0 10*3/uL (ref 0.0–0.5)
Eosinophils Relative: 0 %
HCT: 41.1 % (ref 36.0–46.0)
Hemoglobin: 13.5 g/dL (ref 12.0–15.0)
Immature Granulocytes: 1 %
Lymphocytes Relative: 83 %
Lymphs Abs: 59.7 10*3/uL — ABNORMAL HIGH (ref 0.7–4.0)
MCH: 30.1 pg (ref 26.0–34.0)
MCHC: 32.8 g/dL (ref 30.0–36.0)
MCV: 91.7 fL (ref 80.0–100.0)
Monocytes Absolute: 2.7 10*3/uL — ABNORMAL HIGH (ref 0.1–1.0)
Monocytes Relative: 4 %
Neutro Abs: 9 10*3/uL — ABNORMAL HIGH (ref 1.7–7.7)
Neutrophils Relative %: 12 %
Platelets: 310 10*3/uL (ref 150–400)
RBC: 4.48 MIL/uL (ref 3.87–5.11)
RDW: 14.9 % (ref 11.5–15.5)
WBC Morphology: ABNORMAL
WBC: 72 10*3/uL (ref 4.0–10.5)
nRBC: 0 % (ref 0.0–0.2)

## 2019-04-07 LAB — HEPARIN LEVEL (UNFRACTIONATED)
Heparin Unfractionated: 1.54 [IU]/mL — ABNORMAL HIGH (ref 0.30–0.70)
Heparin Unfractionated: 1.48 [IU]/mL — ABNORMAL HIGH (ref 0.30–0.70)

## 2019-04-07 LAB — GLUCOSE, CAPILLARY
Glucose-Capillary: 161 mg/dL — ABNORMAL HIGH (ref 70–99)
Glucose-Capillary: 149 mg/dL — ABNORMAL HIGH (ref 70–99)
Glucose-Capillary: 144 mg/dL — ABNORMAL HIGH (ref 70–99)
Glucose-Capillary: 179 mg/dL — ABNORMAL HIGH (ref 70–99)

## 2019-04-07 LAB — D-DIMER, QUANTITATIVE: D-Dimer, Quant: 1.13 ug{FEU}/mL — ABNORMAL HIGH (ref 0.00–0.50)

## 2019-04-07 LAB — C-REACTIVE PROTEIN: CRP: 5 mg/dL — ABNORMAL HIGH

## 2019-04-07 LAB — FERRITIN: Ferritin: 2358 ng/mL — ABNORMAL HIGH (ref 11–307)

## 2019-04-07 MED ORDER — POTASSIUM CHLORIDE CRYS ER 20 MEQ PO TBCR
40.0000 meq | EXTENDED_RELEASE_TABLET | ORAL | Status: AC
  Administered 2019-04-07 (×2): 40 meq via ORAL
  Filled 2019-04-07 (×2): qty 2

## 2019-04-07 MED ORDER — CHLORHEXIDINE GLUCONATE CLOTH 2 % EX PADS
6.0000 | MEDICATED_PAD | Freq: Every day | CUTANEOUS | Status: DC
  Administered 2019-04-07 – 2019-04-18 (×9): 6 via TOPICAL

## 2019-04-07 MED ORDER — HEPARIN (PORCINE) 25000 UT/250ML-% IV SOLN
400.0000 [IU]/h | INTRAVENOUS | Status: DC
  Administered 2019-04-07: 23:00:00 600 [IU]/h via INTRAVENOUS
  Administered 2019-04-08: 20:00:00 500 [IU]/h via INTRAVENOUS
  Filled 2019-04-07 (×2): qty 250

## 2019-04-07 NOTE — Progress Notes (Signed)
PROGRESS NOTE                                                                                                                                                                                                             Patient Demographics:    Cynthia Gardner, is a 74 y.o. female, DOB - 22-Jul-1945, [id number]  Outpatient Primary MD for the patient is Cox, Elnita Maxwell, MD   Admit date - 04/06/2019   LOS - 1  No chief complaint on file.      Brief Narrative: Patient is a 74 y.o. 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 fib with RVR in the setting of COVID-19 pneumonia.  She was then transferred to the hospitalist service at [facility] on 1/6.  Please see below for further details.   Subjective:    Darnell Level today remains on HFNC and 100% NRB.  She is otherwise comfortable at rest.  Still in atrial fibrillation with rate better controlled.   Assessment  & Plan :   Acute Hypoxic Resp Failure due to Covid 19 Viral pneumonia: Remains essentially unchanged-still requiring significant amount of oxygen.  Plans are to continue remdesivir/steroids and empiric IV antibiotics (although doubt bacterial pneumonia-we will calcitonin minimally elevated).  Monitor closely-if she deteriorates-she will need to be moved to the ICU.  Fever: afebrile  O2 requirements:  SpO2: 97 % O2 Flow Rate (L/min): (10HFNC,15NRB)   COVID-19 Labs: Recent Labs    04/06/19 1000 04/07/19 0455  DDIMER 2.15* 1.13*  FERRITIN 2,244* 2,358*  CRP 7.9* 5.0*       Component Value Date/Time   BNP 151.2 (H) 04/06/2019 1000    Recent Labs  Lab 04/06/19 1000  PROCALCITON 0.31    No results found for:  SARSCOV2NAA   COVID-19 medications: Remdesivir: 1/4>> Steroids: 1/4>> Actemra: 1/5 at [facility] [place]  Other medications: Rocephin:1/4>> (plan total 5 days) Zithromax:1/4>>1/6  Prone/Incentive Spirometry: encouraged patient to lie prone for 3-4 hours at a time for a total of 16 hours a day, and to encourage incentive spirometry use 3-4/hour.  DVT Prophylaxis  : Heparin infusion  Atrial fibrillation with RVR: Likely provoked  by severe hypoxemia and COVID-19 pneumonia.    Nursing staff attempting to titrate off Cardizem infusion-continue oral beta-blocker.  Remains on full dose anticoagulation with IV heparin-she has a prior history of CVA.  Minimally elevated troponin: Trend is flat per documentation from [facility]-likely secondary demand ischemia.  Note-04/04/18 TTE: EF 60-65%-with no regional wall motion abnormality (done at [place])  Hypokalemia:  Replete and recheck.  Hypernatremia:  Slowly improving-still very mild-continue to follow.  AKI:  Improving-likely hemodynamically mediated.  Follow.  Transaminitis:  Mild transaminitis continues-this is secondary to COVID-19.  Follow.  Hypothyroidism: Continue Synthroid  History of CVA: On IV heparin given new onset A. fib.  Suspect we could discontinue Plavix-she has no other indication for Plavix at this point..  History of CLL: Per documentation from [place]- baseline WBC count around 20-25,000-currently significantly more than baseline as patient on steroids.  Will follow closely.   GI prophylaxis: PPI  Consults  :  None  Procedures  :  None/  ABG: No results found for: PHART, PCO2ART, PO2ART, HCO3, TCO2, ACIDBASEDEF, O2SAT  Vent Settings: N/A  Condition - Extremely Guarded-very tenuous with risk for further deterioration  Family Communication  :  Spouse updated over the phone-he is aware of patient's serious  condition.  Code Status :  Full Code  Diet :  Diet Order            Diet heart healthy/carb modified Room  service appropriate? Yes; Fluid consistency: Thin  Diet effective now               Disposition Plan  :  Remain hospitalized  Barriers to discharge: Hypoxia requiring O2 supplementation/complete 5 days of IV Remdesivir  Antimicorbials  :    Anti-infectives (From admission, onward)   Start     Dose/Rate Route Frequency Ordered Stop   04/07/19 1000  remdesivir 100 mg in sodium chloride 0.9 % 100 mL IVPB  Status:  Discontinued     100 mg 200 mL/hr over 30 Minutes Intravenous Daily 04/06/19 0719 04/06/19 0731   04/06/19 1630  remdesivir 100 mg in sodium chloride 0.9 % 100 mL IVPB     100 mg 200 mL/hr over 30 Minutes Intravenous Daily 04/06/19 0733 04/09/19 0959   04/06/19 1330  cefTRIAXone (ROCEPHIN) 1 g in sodium chloride 0.9 % 100 mL IVPB     1 g 200 mL/hr over 30 Minutes Intravenous Every 24 hours 04/06/19 1251 04/09/19 1329   04/06/19 1330  azithromycin (ZITHROMAX) tablet 500 mg     500 mg Oral Daily 04/06/19 1251 04/06/19 1510   04/06/19 0730  remdesivir 200 mg in sodium chloride 0.9% 250 mL IVPB  Status:  Discontinued     200 mg 580 mL/hr over 30 Minutes Intravenous Once 04/06/19 0719 04/06/19 0731      Inpatient Medications  Scheduled Meds: . albuterol  2 puff Inhalation Q6H  . vitamin C  500 mg Oral Daily  . Chlorhexidine Gluconate Cloth  6 each Topical Q0600  . clopidogrel  75 mg Oral Daily  . fluticasone  2 spray Each Nare Daily  . folic acid  1 mg Oral Daily  . insulin aspart  0-9 Units Subcutaneous TID WC  . levothyroxine  50 mcg Oral QAC breakfast  . methylPREDNISolone (SOLU-MEDROL) injection  40 mg Intravenous Q12H  . metoprolol tartrate  50 mg Oral BID  . pantoprazole  40 mg Oral Daily  . sodium chloride flush  3 mL Intravenous Q12H  . sodium chloride flush  3 mL Intravenous Q12H  . zinc sulfate  220 mg Oral Daily   Continuous Infusions: . sodium chloride Stopped (04/07/19 0838)  . cefTRIAXone (ROCEPHIN)  IV 1 g (04/07/19 1418)  . diltiazem (CARDIZEM)  infusion Stopped (04/07/19 0900)  . heparin 850 Units/hr (04/07/19 1150)  . remdesivir 100 mg in NS 100 mL Stopped (04/07/19 0834)   PRN Meds:.sodium chloride, acetaminophen, chlorpheniramine-HYDROcodone, diltiazem, guaiFENesin-dextromethorphan, menthol-cetylpyridinium, ondansetron **OR** ondansetron (ZOFRAN) IV, oxymetazoline, polyethylene glycol, sodium chloride, sodium chloride flush   Time Spent in minutes  35   See all Orders from today for further details   Oren Binet M.D on 04/07/2019 at 4:31 PM  To page go to [email or web address] - use universal password  [facility] -  Office  [phone]    Objective:   Vitals:   04/07/19 0822 04/07/19 0900 04/07/19 1000 04/07/19 1200  BP: 128/75   125/79  Pulse: (!) 113 98 72 92  Resp: (!) 33 18 (!) 28 (!) 49  Temp:    98.1 F (36.7 C)  TempSrc:      SpO2: (!) 84% 91% 100% 97%  Weight:      Height:        Wt Readings from Last 3 Encounters:  04/06/19 81.6 kg     Intake/Output Summary (Last 24 hours) at 04/07/2019 1631 Last data filed at 04/07/2019 0847 Gross per 24 hour  Intake 280 ml  Output 201 ml  Net 79 ml     Physical Exam Gen Exam:Alert awake-not in any distress HEENT:atraumatic, normocephalic Chest: B/L clear to auscultation anteriorly CVS:S1S2 regular Abdomen:soft non tender, non distended Extremities:no edema Neurology: Non focal Skin: no rash   Data Review:    CBC Recent Labs  Lab 04/06/19 1000 04/07/19 0455  WBC 68.3* 72.0*  HGB 13.1 13.5  HCT 39.6 41.1  PLT 306 310  MCV 90.6 91.7  MCH 30.0 30.1  MCHC 33.1 32.8  RDW 14.6 14.9  LYMPHSABS 54.0* 59.7*  MONOABS 2.5* 2.7*  EOSABS 0.0 0.0  BASOSABS 0.1 0.2*    Chemistries  Recent Labs  Lab 04/06/19 1000 04/07/19 0455  NA 147* 146*  K 3.0* 3.1*  CL 112* 109  CO2 21* 23  GLUCOSE 146* 169*  BUN 54* 50*  CREATININE 1.57* 1.47*  CALCIUM 8.5* 8.5*  AST 63* 63*  ALT 64* 63*  ALKPHOS 73 75  BILITOT 0.7 0.6    ------------------------------------------------------------------------------------------------------------------ No results for input(s): CHOL, HDL, LDLCALC, TRIG, CHOLHDL, LDLDIRECT in the last 72 hours.  Lab Results  Component Value Date   HGBA1C 6.1 (H) 04/06/2019   ------------------------------------------------------------------------------------------------------------------ No results for input(s): TSH, T4TOTAL, T3FREE, THYROIDAB in the last 72 hours.  Invalid input(s): FREET3 ------------------------------------------------------------------------------------------------------------------ Recent Labs    04/06/19 1000 04/07/19 0455  FERRITIN 2,244* 2,358*    Coagulation profile No results for input(s): INR, PROTIME in the last 168 hours.  Recent Labs    04/06/19 1000 04/07/19 0455  DDIMER 2.15* 1.13*    Cardiac Enzymes No results for input(s): CKMB, TROPONINI, MYOGLOBIN in the last 168 hours.  Invalid input(s): CK ------------------------------------------------------------------------------------------------------------------    Component Value Date/Time   BNP 151.2 (H) 04/06/2019 1000    Micro Results No results found for this or any previous visit (from the past 240 hour(s)).  Radiology Reports [place] EKG SITE RITE  Result Date: 04/06/2019 If Site Rite image not attached, placement could not be confirmed due to current cardiac rhythm.

## 2019-04-07 NOTE — Progress Notes (Signed)
Talty for heparin Indication: atrial fibrillation  Heparin Dosing Weight: 76.4 kg  Labs: Recent Labs    04/06/19 1000 04/06/19 1845 04/07/19 0455  HGB 13.1  --  13.5  HCT 39.6  --  41.1  PLT 306  --  310  HEPARINUNFRC  --  1.42* 1.54*  CREATININE 1.57*  --  1.47*  CKTOTAL 442*  --   --     Assessment: 73 yof COVID-19 positive on 03/30/19 presenting with afib with RVR. Pharmacy consulted to dose heparin. Patient is not on anticoagulation PTA but was on Lovenox 80mg /day (for CrCl<30 initially) at [facility] prior to transfer, last dose 1/5 at 1630 per shadow chart. Lovenox prophylactic dose ordered at [facility] 1/6 but d/c'd prior to being given. SCr trended down from 2.2>1.8 at [facility]. Labs still pending on transfer. No active bleed issues documented. Will start heparin now, as has been 12 hours since last Lovenox dose and last CrCl improved to >30.  Heparin level came back at 1.54 tonight.   Goal of Therapy:  Heparin level 0.3-0.7 units/ml Monitor platelets by anticoagulation protocol: Yes   Plan:  Hold heparin x 2 hrs Decrease heparin to 850 units/h Check heparin level in 6-8 hrs Monitor daily heparin level and CBC, s/sx bleeding   Alanda Slim, PharmD, [facility] Clinical Pharmacist Please see AMION for all Pharmacists' Contact Phone Numbers 04/07/2019, 9:30 AM

## 2019-04-07 NOTE — Progress Notes (Signed)
ANTICOAGULATION CONSULT NOTE  Pharmacy Consult for heparin Indication: atrial fibrillation  Heparin Dosing Weight: 76.4 kg  Labs: Recent Labs    04/06/19 1000 04/06/19 1845 04/07/19 0455 04/07/19 1815  HGB 13.1  --  13.5  --   HCT 39.6  --  41.1  --   PLT 306  --  310  --   HEPARINUNFRC  --  1.42* 1.54* 1.48*  CREATININE 1.57*  --  1.47*  --   CKTOTAL 442*  --   --   --     Assessment: 73 yof COVID-19 positive on 03/30/19 presenting with afib with RVR. Pharmacy consulted to dose heparin. Patient is not on anticoagulation PTA but was on Lovenox 80mg /day (for CrCl<30 initially) at [facility] prior to transfer, last dose 1/5 at 1630 per shadow chart. Lovenox prophylactic dose ordered at [facility] 1/6 but d/c'd prior to being given. SCr trended down from 2.2>1.8 at [facility]. Labs still pending on transfer. No active bleed issues documented. Will start heparin now, as has been 12 hours since last Lovenox dose and last CrCl improved to >30.  Heparin level came back still elevated after holding and dose reduction. Rn said that it was a peripheral stick. We will hold again and decrease dose. We will adjust goal since she has a hx of CVA.  Goal of Therapy:  Heparin level 0.3-0.5 Monitor platelets by anticoagulation protocol: Yes   Plan:  Hold heparin x 2 hrs Decrease heparin to 600 units/h Monitor daily heparin level and CBC, s/sx bleeding  Onnie Boer, PharmD, BCIDP, AAHIVP, CPP Infectious Disease Pharmacist 04/07/2019 8:32 PM

## 2019-04-08 ENCOUNTER — Inpatient Hospital Stay: Payer: Self-pay

## 2019-04-08 LAB — CBC WITH DIFFERENTIAL/PLATELET
Abs Immature Granulocytes: 0.24 10*3/uL — ABNORMAL HIGH (ref 0.00–0.07)
Basophils Absolute: 0.1 10*3/uL (ref 0.0–0.1)
Basophils Relative: 0 %
Eosinophils Absolute: 0 10*3/uL (ref 0.0–0.5)
Eosinophils Relative: 0 %
HCT: 37.3 % (ref 36.0–46.0)
Hemoglobin: 12 g/dL (ref 12.0–15.0)
Immature Granulocytes: 1 %
Lymphocytes Relative: 76 %
Lymphs Abs: 35.5 10*3/uL — ABNORMAL HIGH (ref 0.7–4.0)
MCH: 29.5 pg (ref 26.0–34.0)
MCHC: 32.2 g/dL (ref 30.0–36.0)
MCV: 91.6 fL (ref 80.0–100.0)
Monocytes Absolute: 1.7 10*3/uL — ABNORMAL HIGH (ref 0.1–1.0)
Monocytes Relative: 4 %
Neutro Abs: 8.7 10*3/uL — ABNORMAL HIGH (ref 1.7–7.7)
Neutrophils Relative %: 19 %
Platelets: 255 10*3/uL (ref 150–400)
RBC: 4.07 MIL/uL (ref 3.87–5.11)
RDW: 15.1 % (ref 11.5–15.5)
WBC Morphology: ABNORMAL
WBC: 46.3 10*3/uL — ABNORMAL HIGH (ref 4.0–10.5)
nRBC: 0 % (ref 0.0–0.2)

## 2019-04-08 LAB — COMPREHENSIVE METABOLIC PANEL WITH GFR
ALT: 75 U/L — ABNORMAL HIGH (ref 0–44)
AST: 70 U/L — ABNORMAL HIGH (ref 15–41)
Albumin: 2.9 g/dL — ABNORMAL LOW (ref 3.5–5.0)
Alkaline Phosphatase: 78 U/L (ref 38–126)
Anion gap: 11 (ref 5–15)
BUN: 52 mg/dL — ABNORMAL HIGH (ref 8–23)
CO2: 24 mmol/L (ref 22–32)
Calcium: 8.6 mg/dL — ABNORMAL LOW (ref 8.9–10.3)
Chloride: 115 mmol/L — ABNORMAL HIGH (ref 98–111)
Creatinine, Ser: 1.31 mg/dL — ABNORMAL HIGH (ref 0.44–1.00)
GFR calc Af Amer: 46 mL/min — ABNORMAL LOW
GFR calc non Af Amer: 40 mL/min — ABNORMAL LOW
Glucose, Bld: 169 mg/dL — ABNORMAL HIGH (ref 70–99)
Potassium: 3.3 mmol/L — ABNORMAL LOW (ref 3.5–5.1)
Sodium: 150 mmol/L — ABNORMAL HIGH (ref 135–145)
Total Bilirubin: 0.7 mg/dL (ref 0.3–1.2)
Total Protein: 5.8 g/dL — ABNORMAL LOW (ref 6.5–8.1)

## 2019-04-08 LAB — FERRITIN: Ferritin: 1910 ng/mL — ABNORMAL HIGH (ref 11–307)

## 2019-04-08 LAB — C-REACTIVE PROTEIN: CRP: 2.8 mg/dL — ABNORMAL HIGH

## 2019-04-08 LAB — HEPARIN LEVEL (UNFRACTIONATED): Heparin Unfractionated: 0.87 [IU]/mL — ABNORMAL HIGH (ref 0.30–0.70)

## 2019-04-08 LAB — GLUCOSE, CAPILLARY
Glucose-Capillary: 147 mg/dL — ABNORMAL HIGH (ref 70–99)
Glucose-Capillary: 237 mg/dL — ABNORMAL HIGH (ref 70–99)
Glucose-Capillary: 166 mg/dL — ABNORMAL HIGH (ref 70–99)

## 2019-04-08 LAB — D-DIMER, QUANTITATIVE: D-Dimer, Quant: 0.73 ug{FEU}/mL — ABNORMAL HIGH (ref 0.00–0.50)

## 2019-04-08 MED ORDER — DEXTROSE 5 % IV SOLN: INTRAVENOUS | Status: AC

## 2019-04-08 MED ORDER — ENSURE ENLIVE PO LIQD
237.0000 mL | Freq: Two times a day (BID) | ORAL | Status: DC
  Administered 2019-04-08 – 2019-04-18 (×20): 237 mL via ORAL

## 2019-04-08 MED ORDER — POTASSIUM CHLORIDE CRYS ER 20 MEQ PO TBCR
40.0000 meq | EXTENDED_RELEASE_TABLET | ORAL | Status: AC
  Administered 2019-04-08 (×2): 40 meq via ORAL
  Filled 2019-04-08 (×2): qty 2

## 2019-04-08 MED ORDER — METOPROLOL TARTRATE 25 MG PO TABS
50.0000 mg | ORAL_TABLET | Freq: Three times a day (TID) | ORAL | Status: DC
  Administered 2019-04-08 – 2019-04-09 (×4): 50 mg via ORAL
  Filled 2019-04-08 (×4): qty 2

## 2019-04-08 NOTE — Progress Notes (Signed)
Peripherally Inserted Central Catheter/Midline Placement  The IV Nurse has discussed with the patient and/or persons authorized to consent for the patient, the purpose of this procedure and the potential benefits and risks involved with this procedure.  The benefits include less needle sticks, lab draws from the catheter, and the patient may be discharged home with the catheter. Risks include, but not limited to, infection, bleeding, blood clot (thrombus formation), and puncture of an artery; nerve damage and irregular heartbeat and possibility to perform a PICC exchange if needed/ordered by physician.  Alternatives to this procedure were also discussed.  Bard Power PICC patient education guide, fact sheet on infection prevention and patient information card has been provided to patient /or left at bedside.    PICC/Midline Placement Documentation  PICC Double Lumen [id number] PICC Right Basilic 35 cm (Active)  Indication for Insertion or Continuance of Line Prolonged intravenous therapies 04/08/19 1400  Exposed Catheter (cm) 0 cm 04/08/19 1400  Site Assessment Clean;Dry;Intact 04/08/19 1400  Lumen #1 Status Flushed;Blood return noted 04/08/19 1400  Lumen #2 Status Flushed;Blood return noted 04/08/19 1400  Dressing Type Transparent 04/08/19 1400  Dressing Status Clean;Dry;Intact;Antimicrobial disc in place 04/08/19 1400  Dressing Change Due 04/15/19 04/08/19 1400       Jule Economy Horton 04/08/2019, 2:45 PM

## 2019-04-08 NOTE — Progress Notes (Signed)
PROGRESS NOTE                                                                                                                                                                                                             Patient Demographics:    Cynthia Gardner, is a 74 y.o. female, DOB - 11/05/1945, [id number]  Outpatient Primary MD for the patient is Cox, Elnita Maxwell, MD   Admit date - 04/06/2019   LOS - 2  No chief complaint on file.      Brief Narrative: Patient is a 74 y.o. 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 fib with RVR in the setting of COVID-19 pneumonia.  She was then transferred to the hospitalist service at [facility] on 1/6.  Please see below for further details.   Subjective:   She feels better but she is still very hypoxic requiring both HFNC and NRB.    Assessment  & Plan :   Acute Hypoxic Resp Failure due to Covid 19 Viral pneumonia: Remains essentially the same-still severely hypoxemic-requiring both 15 L of HFNC and 100% NRB.  Continue steroids and remdesivir.  She is also on empiric IV antibiotics-for a minimally elevated procalcitonin-although she is unlikely to have bacterial pneumonia.  Continue to monitor closely-if she deteriorates-she will need to be moved to the ICU.  Fever: afebrile  O2 requirements:  SpO2: 93 % O2 Flow Rate (L/min): (13HFNC/10NRB)   COVID-19 Labs: Recent Labs    04/06/19 1000 04/07/19 0455 04/08/19 0508  DDIMER 2.15* 1.13* 0.73*  FERRITIN 2,244* 2,358* 1,910*  CRP 7.9* 5.0* 2.8*       Component Value Date/Time   BNP 151.2 (H) 04/06/2019 1000    Recent Labs  Lab 04/06/19 1000  PROCALCITON 0.31    No  results found for: SARSCOV2NAA   COVID-19 medications: Remdesivir: 1/4>> Steroids: 1/4>> Actemra: 1/5 at [facility]  Other medications: Rocephin:1/4>> (plan total 5 days) Zithromax:1/4>>1/6  Prone/Incentive Spirometry: encouraged patient to lie prone for 3-4 hours at a time for a total of 16 hours a day, and to encourage incentive spirometry use 3-4/hour.  DVT Prophylaxis  : Heparin  infusion  Atrial fibrillation with RVR: Continues to have episodes of RVR-change metoprolol to 3 times daily dosing.  Continue as needed IV Cardizem push-if she you develop sustained A. fib with RVR-then we will need to restart Cardizem infusion.  Remains on full dose anticoagulation-given history of CVA in the past.   Minimally elevated troponin: Trend is flat per documentation from [facility]-likely secondary demand ischemia.  Note-04/04/18 TTE: EF 60-65%-with no regional wall motion abnormality (done at [place])  Hypokalemia:  Replete and recheck-check magnesium levels tomorrow morning.  Hypernatremia:  Continues to have persistent hyponatremia-slightly worse today-encourage oral intake-gently hydrate with D5W for 12 hours today.  Recheck tomorrow.  AKI:  Improving-likely hemodynamically mediated.  Follow.  Transaminitis:  Mild transaminitis continues-this is secondary to COVID-19.  Follow.  Hypothyroidism: Continue Synthroid  History of CVA: On IV heparin given new onset A. fib.  Plavix has been discontinued-as there is no indication for Plavix at this point-especially now that she is on anticoagulation.    History of CLL: Per documentation from [place]- baseline WBC count around 20-25,000-currently significantly more than baseline as patient on steroids.  Will follow closely.   GI prophylaxis: PPI  Consults  :  None  Procedures  :  None/  ABG: No results found for: PHART, PCO2ART, PO2ART, HCO3, TCO2, ACIDBASEDEF, O2SAT  Vent Settings: N/A  Condition - Extremely Guarded-very tenuous  with risk for further deterioration  Family Communication  : Left a voicemail for spouse on 1/8  Code Status :  Full Code  Diet :  Diet Order            Diet heart healthy/carb modified Room service appropriate? Yes; Fluid consistency: Thin  Diet effective now               Disposition Plan  :  Remain hospitalized  Barriers to discharge: Hypoxia requiring O2 supplementation/complete 5 days of IV Remdesivir  Antimicorbials  :    Anti-infectives (From admission, onward)   Start     Dose/Rate Route Frequency Ordered Stop   04/07/19 1000  remdesivir 100 mg in sodium chloride 0.9 % 100 mL IVPB  Status:  Discontinued     100 mg 200 mL/hr over 30 Minutes Intravenous Daily 04/06/19 0719 04/06/19 0731   04/06/19 1630  remdesivir 100 mg in sodium chloride 0.9 % 100 mL IVPB     100 mg 200 mL/hr over 30 Minutes Intravenous Daily 04/06/19 0733 04/08/19 1108   04/06/19 1330  cefTRIAXone (ROCEPHIN) 1 g in sodium chloride 0.9 % 100 mL IVPB     1 g 200 mL/hr over 30 Minutes Intravenous Every 24 hours 04/06/19 1251 04/09/19 1329   04/06/19 1330  azithromycin (ZITHROMAX) tablet 500 mg     500 mg Oral Daily 04/06/19 1251 04/06/19 1510   04/06/19 0730  remdesivir 200 mg in sodium chloride 0.9% 250 mL IVPB  Status:  Discontinued     200 mg 580 mL/hr over 30 Minutes Intravenous Once 04/06/19 0719 04/06/19 0731      Inpatient Medications  Scheduled Meds: . albuterol  2 puff Inhalation Q6H  . vitamin C  500 mg Oral Daily  . Chlorhexidine Gluconate Cloth  6 each Topical Q0600  . feeding supplement (ENSURE ENLIVE)  237 mL Oral BID BM  . fluticasone  2 spray Each Nare Daily  . folic acid  1 mg Oral Daily  . insulin aspart  0-9 Units Subcutaneous TID WC  . levothyroxine  50 mcg Oral QAC breakfast  .  methylPREDNISolone (SOLU-MEDROL) injection  40 mg Intravenous Q12H  . metoprolol tartrate  50 mg Oral TID  . pantoprazole  40 mg Oral Daily  . sodium chloride flush  3 mL Intravenous Q12H  .  sodium chloride flush  3 mL Intravenous Q12H  . zinc sulfate  220 mg Oral Daily   Continuous Infusions: . sodium chloride Stopped (04/07/19 0030)  . cefTRIAXone (ROCEPHIN)  IV 1 g (04/07/19 1418)  . dextrose 60 mL/hr at 04/08/19 0816  . heparin 600 Units/hr (04/07/19 2231)   PRN Meds:.sodium chloride, acetaminophen, chlorpheniramine-HYDROcodone, diltiazem, guaiFENesin-dextromethorphan, menthol-cetylpyridinium, ondansetron **OR** ondansetron (ZOFRAN) IV, oxymetazoline, polyethylene glycol, sodium chloride, sodium chloride flush   Time Spent in minutes  35   See all Orders from today for further details   Oren Binet M.D on 04/08/2019 at 11:45 AM  To page go to [email or web address] - use universal password  [facility] -  Office  [phone]    Objective:   Vitals:   04/08/19 0000 04/08/19 0514 04/08/19 0554 04/08/19 0603  BP: (!) 136/92     Pulse:      Resp: (!) 34  (!) 38   Temp: 97.7 F (36.5 C) (!) 97.3 F (36.3 C)    TempSrc: Oral Axillary    SpO2: 93%   93%  Weight:      Height:        Wt Readings from Last 3 Encounters:  04/06/19 81.6 kg     Intake/Output Summary (Last 24 hours) at 04/08/2019 1145 Last data filed at 04/08/2019 0600 Gross per 24 hour  Intake 1051.56 ml  Output 800 ml  Net 251.56 ml     Physical Exam Gen Exam:Alert awake-not in any distress HEENT:atraumatic, normocephalic Chest: B/L clear to auscultation anteriorly CVS:S1S2 regular Abdomen:soft non tender, non distended Extremities:no edema Neurology: Non focal Skin: no rash   Data Review:    CBC Recent Labs  Lab 04/06/19 1000 04/07/19 0455 04/08/19 0508  WBC 68.3* 72.0* 46.3*  HGB 13.1 13.5 12.0  HCT 39.6 41.1 37.3  PLT 306 310 255  MCV 90.6 91.7 91.6  MCH 30.0 30.1 29.5  MCHC 33.1 32.8 32.2  RDW 14.6 14.9 15.1  LYMPHSABS 54.0* 59.7* 35.5*  MONOABS 2.5* 2.7* 1.7*  EOSABS 0.0 0.0 0.0  BASOSABS 0.1 0.2* 0.1    Chemistries  Recent Labs  Lab 04/06/19 1000  04/07/19 0455 04/08/19 0508  NA 147* 146* 150*  K 3.0* 3.1* 3.3*  CL 112* 109 115*  CO2 21* 23 24  GLUCOSE 146* 169* 169*  BUN 54* 50* 52*  CREATININE 1.57* 1.47* 1.31*  CALCIUM 8.5* 8.5* 8.6*  AST 63* 63* 70*  ALT 64* 63* 75*  ALKPHOS 73 75 78  BILITOT 0.7 0.6 0.7   ------------------------------------------------------------------------------------------------------------------ No results for input(s): CHOL, HDL, LDLCALC, TRIG, CHOLHDL, LDLDIRECT in the last 72 hours.  Lab Results  Component Value Date   HGBA1C 6.1 (H) 04/06/2019   ------------------------------------------------------------------------------------------------------------------ No results for input(s): TSH, T4TOTAL, T3FREE, THYROIDAB in the last 72 hours.  Invalid input(s): FREET3 ------------------------------------------------------------------------------------------------------------------ Recent Labs    04/07/19 0455 04/08/19 0508  FERRITIN 2,358* 1,910*    Coagulation profile No results for input(s): INR, PROTIME in the last 168 hours.  Recent Labs    04/07/19 0455 04/08/19 0508  DDIMER 1.13* 0.73*    Cardiac Enzymes No results for input(s): CKMB, TROPONINI, MYOGLOBIN in the last 168 hours.  Invalid input(s): CK ------------------------------------------------------------------------------------------------------------------    Component Value Date/Time   BNP 151.2 (H) 04/06/2019 1000  Micro Results No results found for this or any previous visit (from the past 240 hour(s)).  Radiology Reports [place] EKG SITE RITE  Result Date: 04/08/2019 If Site Rite image not attached, placement could not be confirmed due to current cardiac rhythm.  [place] EKG SITE RITE  Result Date: 04/06/2019 If Site Rite image not attached, placement could not be confirmed due to current cardiac rhythm.

## 2019-04-08 NOTE — Progress Notes (Signed)
Patient stated that she wanted her son, Ledon Snare, primary contact for her.  System updated.  Earleen Reaper RN

## 2019-04-08 NOTE — Progress Notes (Signed)
Patient son Audry Pili updated on patient status.

## 2019-04-08 NOTE — Evaluation (Signed)
Physical Therapy Evaluation Patient Details Name: Cynthia Gardner MRN: [id number] DOB: 1946/01/17 Today's Date: 04/08/2019   History of Present Illness  Patient is a 74 y.o. 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 fib with RVR in the setting of COVID-19 pneumonia  Clinical Impression  The patient in bed on 15 L HFNC and NRB(no flap). With SPO2 ( ear sensor) at rest 94%. RR 24. Patient assisted to sitting and standing x 2 with SPO2 dropping to 80%, RR 30. Patient reports sitting up felt good and relaxing. Pt admitted with above diagnosis.  Pt currently with functional limitations due to the deficits listed below (see PT Problem List). Pt will benefit from skilled PT to increase their independence and safety with mobility to allow discharge to the venue listed below.   Pt. Reports spouse is covid positive and home on O2.     Follow Up Recommendations Home health PT;Supervision/Assistance - 24 hour    Equipment Recommendations  (TBA\)    Recommendations for Other Services OT consult     Precautions / Restrictions Precautions Precautions: Fall Precaution Comments: on 15 L of HF and NRB, desats when NRB removed(No flap present)      Mobility  Bed Mobility Overal bed mobility: Needs Assistance Bed Mobility: Rolling;Sidelying to Sit Rolling: Min guard Sidelying to sit: Min assist       General bed mobility comments: assist to sit trunk up, placed legs over edge, able to place legs back intobed  Transfers Overall transfer level: Needs assistance Equipment used: 1 person hand held assist Transfers: Sit to/from Stand Sit to Stand: Mod assist         General transfer comment: assist to powerup from bed x 2 to have linens changed. Patient's  Spo2 drops to81%, HR 11, RR 28  Ambulation/Gait                Stairs            Wheelchair Mobility    Modified Rankin (Stroke Patients Only)       Balance Overall balance assessment: Needs assistance;History of Falls Sitting-balance support: No upper extremity supported Sitting balance-Leahy Scale: Good Sitting balance - Comments: sat on bed edge and crossed legs   Standing balance support: During functional activity;Single extremity supported Standing balance-Leahy Scale: Fair                               Pertinent Vitals/Pain Pain Assessment: No/denies pain    Home Living Family/patient expects to be discharged to:: Private residence Living Arrangements: Spouse/significant other;Children Available Help at Discharge: Family;Available 24 hours/day Type of Home: House       Home Layout: Two level;Bed/bath upstairs Home Equipment: None Additional Comments: I forgot to ask about BR    Prior Function Level of Independence: Independent               Hand Dominance        Extremity/Trunk Assessment   Upper Extremity Assessment Upper Extremity Assessment: Generalized weakness    Lower Extremity Assessment Lower Extremity Assessment: Generalized weakness    Cervical / Trunk Assessment Cervical / Trunk Assessment: Normal  Communication   Communication: No difficulties  Cognition Arousal/Alertness: Awake/alert Behavior During Therapy: WFL for tasks  assessed/performed Overall Cognitive Status: Within Functional Limits for tasks assessed                                        General Comments      Exercises     Assessment/Plan    PT Assessment Patient needs continued PT services  PT Problem List Decreased strength;Decreased mobility;Decreased safety awareness;Decreased knowledge of precautions;Decreased activity tolerance;Cardiopulmonary status limiting activity;Decreased knowledge of use of DME        PT Treatment Interventions DME instruction;Therapeutic activities;Gait training;Therapeutic exercise;Patient/family education;Functional mobility training    PT Goals (Current goals can be found in the Care Plan section)  Acute Rehab PT Goals Patient Stated Goal: to get better and go home PT Goal Formulation: With patient Time For Goal Achievement: 04/22/19 Potential to Achieve Goals: Good    Frequency Min 3X/week   Barriers to discharge        Co-evaluation               AM-PAC PT "6 Clicks" Mobility  Outcome Measure Help needed turning from your back to your side while in a flat bed without using bedrails?: A Lot Help needed moving from lying on your back to sitting on the side of a flat bed without using bedrails?: A Lot Help needed moving to and from a bed to a chair (including a wheelchair)?: A Lot Help needed standing up from a chair using your arms (e.g., wheelchair or bedside chair)?: A Lot Help needed to walk in hospital room?: Total Help needed climbing 3-5 steps with a railing? : Total 6 Click Score: 10    End of Session Equipment Utilized During Treatment: Oxygen Activity Tolerance: Treatment limited secondary to medical complications (Comment) Patient left: in bed;with call bell/phone within reach;with nursing/sitter in room Nurse Communication: Mobility status PT Visit Diagnosis: Unsteadiness on feet (R26.81);History of falling (Z91.81);Difficulty in walking, not elsewhere classified (R26.2)    Time: [id number] PT Time Calculation (min) (ACUTE ONLY): 39 min   Charges:   PT Evaluation $PT Eval Moderate Complexity: 1 Mod PT Treatments $Therapeutic Activity: 8-22 mins $Self Care/Home Management: [place] Pager [phone] Office [phone]   Claretha Cooper 04/08/2019, 5:00 PM

## 2019-04-08 NOTE — Progress Notes (Signed)
ANTICOAGULATION CONSULT NOTE  Pharmacy Consult for heparin Indication: atrial fibrillation  Heparin Dosing Weight: 76.4 kg  Labs: Recent Labs    04/06/19 1000 04/07/19 0455 04/07/19 1815 04/08/19 0508  HGB 13.1 13.5  --  12.0  HCT 39.6 41.1  --  37.3  PLT 306 310  --  255  HEPARINUNFRC  --  1.54* 1.48* 0.87*  CREATININE 1.57* 1.47*  --  1.31*  CKTOTAL 442*  --   --   --     Assessment: 73 yof COVID-19 positive on 03/30/19 presenting with afib with RVR. Pharmacy consulted to dose heparin. Patient is not on anticoagulation PTA but was on Lovenox 80mg /day (for CrCl<30 initially) at [facility] prior to transfer, last dose 1/5 at 1630 per shadow chart. Lovenox prophylactic dose ordered at [facility] 1/6 but d/c'd prior to being given. SCr trended down from 2.2>1.8 at [facility]. Labs still pending on transfer. No active bleed issues documented. Will start heparin now, as has been 12 hours since last Lovenox dose and last CrCl improved to >30.  Heparin level came back at 0.87 today   Goal of Therapy:  Heparin level 0.3-0.7 units/ml Monitor platelets by anticoagulation protocol: Yes   Plan:  Decrease heparin to 500 units/h Monitor daily heparin level and CBC, s/sx bleeding   Alanda Slim, PharmD, [facility] Clinical Pharmacist Please see AMION for all Pharmacists' Contact Phone Numbers 04/08/2019, 12:51 PM

## 2019-04-08 NOTE — Plan of Care (Addendum)
Patient is currently on 13L HFNC/NRB her oxygen saturations dropped into the mid to high 70's while on 8L HFNC and 10 NRB. She could not maintain adequate oxygenation at 10L HFNC and 10NRB, therefore her oxygen was increased. She is currently maintaining saturation at 93%. There is no parameter set as to what her oxygen saturation needs to be maintained at. I administered 57mLs of cardizem this morning, her heart rate increased to 138 bpm. Her current heart rate is 94. Also, she is still on the heparin gtt going at a rate of 600units/hr. She had one loose stool overnight and this morning. Her appetite is very poor, I tried to convince her to eat a pack of saltine crackers. She has only ate 75% of one cracker in the pack since 2100. She has been c/o chills. Her temperatures have been in the 97's.

## 2019-04-09 LAB — GLUCOSE, CAPILLARY
Glucose-Capillary: 128 mg/dL — ABNORMAL HIGH (ref 70–99)
Glucose-Capillary: 176 mg/dL — ABNORMAL HIGH (ref 70–99)
Glucose-Capillary: 136 mg/dL — ABNORMAL HIGH (ref 70–99)
Glucose-Capillary: 119 mg/dL — ABNORMAL HIGH (ref 70–99)

## 2019-04-09 LAB — FERRITIN: Ferritin: 2180 ng/mL — ABNORMAL HIGH (ref 11–307)

## 2019-04-09 LAB — COMPREHENSIVE METABOLIC PANEL WITH GFR
ALT: 65 U/L — ABNORMAL HIGH (ref 0–44)
AST: 54 U/L — ABNORMAL HIGH (ref 15–41)
Albumin: 3.2 g/dL — ABNORMAL LOW (ref 3.5–5.0)
Alkaline Phosphatase: 87 U/L (ref 38–126)
Anion gap: 10 (ref 5–15)
BUN: 53 mg/dL — ABNORMAL HIGH (ref 8–23)
CO2: 26 mmol/L (ref 22–32)
Calcium: 8.6 mg/dL — ABNORMAL LOW (ref 8.9–10.3)
Chloride: 111 mmol/L (ref 98–111)
Creatinine, Ser: 1.3 mg/dL — ABNORMAL HIGH (ref 0.44–1.00)
GFR calc Af Amer: 47 mL/min — ABNORMAL LOW
GFR calc non Af Amer: 40 mL/min — ABNORMAL LOW
Glucose, Bld: 147 mg/dL — ABNORMAL HIGH (ref 70–99)
Potassium: 4.6 mmol/L (ref 3.5–5.1)
Sodium: 147 mmol/L — ABNORMAL HIGH (ref 135–145)
Total Bilirubin: 0.9 mg/dL (ref 0.3–1.2)
Total Protein: 5.8 g/dL — ABNORMAL LOW (ref 6.5–8.1)

## 2019-04-09 LAB — CBC WITH DIFFERENTIAL/PLATELET
Abs Immature Granulocytes: 0.56 10*3/uL — ABNORMAL HIGH (ref 0.00–0.07)
Basophils Absolute: 0.2 10*3/uL — ABNORMAL HIGH (ref 0.0–0.1)
Basophils Relative: 0 %
Eosinophils Absolute: 0 10*3/uL (ref 0.0–0.5)
Eosinophils Relative: 0 %
HCT: 38.7 % (ref 36.0–46.0)
Hemoglobin: 12.4 g/dL (ref 12.0–15.0)
Immature Granulocytes: 1 %
Lymphocytes Relative: 75 %
Lymphs Abs: 53.2 10*3/uL — ABNORMAL HIGH (ref 0.7–4.0)
MCH: 29.5 pg (ref 26.0–34.0)
MCHC: 32 g/dL (ref 30.0–36.0)
MCV: 92.1 fL (ref 80.0–100.0)
Monocytes Absolute: 2.3 10*3/uL — ABNORMAL HIGH (ref 0.1–1.0)
Monocytes Relative: 3 %
Neutro Abs: 14.6 10*3/uL — ABNORMAL HIGH (ref 1.7–7.7)
Neutrophils Relative %: 21 %
Platelets: 275 10*3/uL (ref 150–400)
RBC: 4.2 MIL/uL (ref 3.87–5.11)
RDW: 14.9 % (ref 11.5–15.5)
WBC: 70.8 10*3/uL (ref 4.0–10.5)
nRBC: 0 % (ref 0.0–0.2)

## 2019-04-09 LAB — MAGNESIUM: Magnesium: 2.1 mg/dL (ref 1.7–2.4)

## 2019-04-09 LAB — D-DIMER, QUANTITATIVE: D-Dimer, Quant: 0.6 ug{FEU}/mL — ABNORMAL HIGH (ref 0.00–0.50)

## 2019-04-09 LAB — HEPARIN LEVEL (UNFRACTIONATED)
Heparin Unfractionated: 0.66 [IU]/mL (ref 0.30–0.70)
Heparin Unfractionated: 0.73 [IU]/mL — ABNORMAL HIGH (ref 0.30–0.70)
Heparin Unfractionated: 0.65 [IU]/mL (ref 0.30–0.70)

## 2019-04-09 LAB — C-REACTIVE PROTEIN: CRP: 1.9 mg/dL — ABNORMAL HIGH

## 2019-04-09 MED ORDER — METOPROLOL TARTRATE 25 MG PO TABS
100.0000 mg | ORAL_TABLET | Freq: Two times a day (BID) | ORAL | Status: DC
  Administered 2019-04-09: 100 mg via ORAL
  Administered 2019-04-10: 50 mg via ORAL
  Administered 2019-04-10 – 2019-04-13 (×6): 100 mg via ORAL
  Filled 2019-04-09 (×9): qty 4

## 2019-04-09 MED ORDER — DEXTROSE 5 % IV SOLN: INTRAVENOUS | Status: AC

## 2019-04-09 MED ORDER — METOPROLOL TARTRATE 25 MG PO TABS
50.0000 mg | ORAL_TABLET | Freq: Once | ORAL | Status: AC
  Administered 2019-04-09: 50 mg via ORAL
  Filled 2019-04-09: qty 2

## 2019-04-09 NOTE — Progress Notes (Addendum)
Patient was on 10NRB and 13HFNC the beginning of the shift and had to be titrated up at one point to 15L. I have been weaning her down throughout the night and she is currently on 13L HFNC and is tolerating weaning off oxygen well.  Her oxygen saturations have been maintaining between 88-93%. I explained to her the importance of not being on the NRB when its not needed and that her oxygen saturations are being monitored when we aren't present at bedside with her, monitor techs are monitoring her as well. Patient verbalized understanding. Also, shes still in Afib; her heart rate has sustained between the high 70's and low 100's. She is still c/o chills, her last axillary temperature was 96.3. Her highest axillary temperature overnight has been 97.7. I applied two warming blankets to patient who was very appreciative. Currently still on the heparin gtt, rate 500 units/hr. No diarrhea overnight. PO intake still poor, patient states that she has no appetite. She is in bed resting on her left side.

## 2019-04-09 NOTE — Evaluation (Addendum)
Occupational Therapy Evaluation Patient Details Name: Cynthia Gardner MRN: [id number] DOB: [birth year] Today's Date: 04/09/2019    History of Present Illness Patient is a 74 y.o. 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 fib with RVR in the setting of COVID-19 pneumonia   Clinical Impression   PTA pt independent, living with husband and living active life style. At time of eval, pt requires min guard for bed mobility and min A for sit <> stand. She is very deconditioned with poor activity tolerance. Pt requires 15L HFNC at this time. With EOB and brief stand (<1 minute) pt desat into low 80s and needing ~10 mins recovery time while supine. RR up to 42. Education given on pursed lip breathing. Noted abdominal dyssynchrony with breathing. Pt able to recover to mid 90s, but needing assist to boost up in bed due to bery low avtivity tolerance. Education given on IS and flutter usage. Given current status recommend HHOT. Will continue to follow per POC listed below.     Follow Up Recommendations  Home health OT;Supervision/Assistance - 24 hour    Equipment Recommendations  3 in 1 bedside commode    Recommendations for Other Services       Precautions / Restrictions Precautions Precautions: Fall Precaution Comments: 15L HFNC, may need NRB with mob progression Restrictions Weight Bearing Restrictions: No      Mobility Bed Mobility Overal bed mobility: Needs Assistance Bed Mobility: Supine to Sit;Sit to Supine     Supine to sit: Min guard Sit to supine: Min guard   General bed mobility comments: min guard for safety; increased time, use of rails  Transfers Overall transfer level: Needs assistance Equipment used: 1 person hand held  assist Transfers: Sit to/from Stand Sit to Stand: Min assist         General transfer comment: min A to rise and steady, able to tolerate <1 minute before fatiguing    Balance Overall balance assessment: Needs assistance;History of Falls Sitting-balance support: No upper extremity supported Sitting balance-Leahy Scale: Good Sitting balance - Comments: sat on bed edge and crossed legs   Standing balance support: During functional activity;Single extremity supported Standing balance-Leahy Scale: Fair                             ADL either performed or assessed with clinical judgement   ADL Overall ADL's : Needs assistance/impaired Eating/Feeding: Set up;Sitting   Grooming: Set up;Sitting   Upper Body Bathing: Set up;Sitting   Lower Body Bathing: Moderate assistance;Sit to/from stand;Sitting/lateral leans   Upper Body Dressing : Set up;Sitting   Lower Body Dressing: Moderate assistance;Sit to/from stand;Sitting/lateral leans Lower Body Dressing Details (indicate cue type and reason): due to fatigue and poor activity tolerance Toilet Transfer: Minimal assistance;Stand-pivot;BSC   Toileting- Clothing Manipulation and Hygiene: Set up;Sit to/from stand   Tub/ Shower Transfer: Minimal assistance;Shower seat;Rolling walker;Stand-pivot   Functional mobility during ADLs: (unable to attempt due to dropping sats with sit <> stand) General ADL Comments: pt limited by poor activity tolerance, compromised cardiopulmonary status, and generalized weakness     Vision Patient Visual Report: No change from baseline       Perception     Praxis      Pertinent Vitals/Pain Pain Assessment: No/denies  pain     Hand Dominance     Extremity/Trunk Assessment Upper Extremity Assessment Upper Extremity Assessment: Generalized weakness   Lower Extremity Assessment Lower Extremity Assessment: Generalized weakness       Communication Communication Communication: No  difficulties   Cognition Arousal/Alertness: Awake/alert Behavior During Therapy: Anxious Overall Cognitive Status: Within Functional Limits for tasks assessed                                 General Comments: would benefit from further assessment   General Comments       Exercises     Shoulder Instructions      Home Living Family/patient expects to be discharged to:: Private residence Living Arrangements: Spouse/significant other;Children Available Help at Discharge: Family;Available 24 hours/day Type of Home: House       Home Layout: Two level;Bed/bath upstairs;Able to live on main level with bedroom/bathroom Alternate Level Stairs-Number of Steps: does not need to go upstairs   Bathroom Shower/Tub: Occupational psychologist: Standard     Home Equipment: Shower seat - built in          Prior Functioning/Environment Level of Independence: Independent        Comments: independent, traveling with husband        OT Problem List: Decreased strength;Decreased knowledge of use of DME or AE;Decreased activity tolerance;Cardiopulmonary status limiting activity;Impaired balance (sitting and/or standing)      OT Treatment/Interventions: Self-care/ADL training;Therapeutic exercise;Patient/family education;Balance training;Energy conservation;Therapeutic activities;DME and/or AE instruction    OT Goals(Current goals can be found in the care plan section) Acute Rehab OT Goals Patient Stated Goal: to get better and go home OT Goal Formulation: With patient Time For Goal Achievement: 04/23/19 Potential to Achieve Goals: Good  OT Frequency: Min 2X/week   Barriers to D/C:            Co-evaluation              AM-PAC OT "6 Clicks" Daily Activity     Outcome Measure Help from another person eating meals?: A Little Help from another person taking care of personal grooming?: A Little Help from another person toileting, which includes using  toliet, bedpan, or urinal?: A Lot Help from another person bathing (including washing, rinsing, drying)?: A Lot Help from another person to put on and taking off regular upper body clothing?: A Little Help from another person to put on and taking off regular lower body clothing?: A Lot 6 Click Score: 15   End of Session Equipment Utilized During Treatment: Oxygen Nurse Communication: Mobility status  Activity Tolerance: Patient limited by fatigue Patient left: in bed;with call bell/phone within reach  OT Visit Diagnosis: Unsteadiness on feet (R26.81);Other abnormalities of gait and mobility (R26.89);Muscle weakness (generalized) (M62.81)                Time: [id number] OT Time Calculation (min): 42 min Charges:  OT General Charges $OT Visit: 1 Visit OT Evaluation $OT Eval Moderate Complexity: 1 Mod OT Treatments $Self Care/Home Management : 23-37 mins  Zenovia Jarred, MSOT, OTR/L Acute Rehabilitation Services [facility] Office Number: [phone]  Zenovia Jarred 04/09/2019, 5:55 PM

## 2019-04-09 NOTE — Progress Notes (Signed)
   Vital Signs MEWS/VS Documentation      04/09/2019 0744 04/09/2019 0811 04/09/2019 0921 04/09/2019 0933   MEWS Score:  3  4  4  3    MEWS Score Color:  Yellow  Red  Red  Yellow   Resp:  --  --  (!) 28  (!) 24   Pulse:  --  --  --  (!) 119   O2 Device:  --  --  --  HFNC   O2 Flow Rate (L/min):  --  --  --  15 L/min     Pt currently has a yellow mews score d/t increased HR and increased RR. MD aware. PO lopressor dose increased. Pt stable, will cont to monitor.       Christella Hartigan 04/09/2019,9:34 AM

## 2019-04-09 NOTE — Progress Notes (Signed)
PROGRESS NOTE                                                                                                                                                                                                             Patient Demographics:    Cynthia Gardner, is a 74 y.o. female, DOB - 10/06/1945, [id number]  Outpatient Primary MD for the patient is Cox, Elnita Maxwell, MD   Admit date - 04/06/2019   LOS - 3  No chief complaint on file.      Brief Narrative: Patient is a 74 y.o. 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 fib with RVR in the setting of COVID-19 pneumonia.  She was then transferred to the hospitalist service at [facility] on 1/6.  Please see below for further details.   Subjective:   Patient feels better-she is only on 15 L of HFNC today-not on NRB.    Assessment  & Plan :   Acute Hypoxic Resp Failure due to Covid 19 Viral pneumonia: Somewhat improved as she is not requiring NRB today-only on 15 L of HFNC.  Completed a course of remdesivir and empiric IV antibiotics (doubt bacterial pneumonia-minimally elevated procalcitonin).  Continue supportive care-continue steroids-hopefully we can continue to titrate down the FiO2.    Fever: afebrile  O2 requirements:  SpO2: 90 % O2 Flow Rate (L/min): 15 L/min   COVID-19 Labs: Recent Labs    04/07/19 0455 04/08/19 0508 04/09/19 0500  DDIMER 1.13* 0.73* 0.60*  FERRITIN 2,358* 1,910* 2,180*  CRP 5.0* 2.8* 1.9*       Component Value Date/Time   BNP 151.2 (H) 04/06/2019 1000    Recent Labs  Lab 04/06/19 1000  PROCALCITON 0.31    No results found for: SARSCOV2NAA   COVID-19 medications: Remdesivir:  1/4>>/8 Steroids: 1/4>> Actemra: 1/5 at [facility]  Other medications: Rocephin:1/4>> 1/8 Zithromax:1/4>>1/6  Prone/Incentive Spirometry: encouraged patient to lie prone for 3-4 hours at a time for a total of 16 hours a day, and to encourage incentive spirometry use 3-4/hour.  DVT Prophylaxis  : Heparin infusion  Atrial fibrillation with RVR: Although heart rate overall better-continues to  have tachycardia up to the low 100s range.  Change metoprolol 200 mg twice daily-continue IV as needed Cardizem.  If develops sustained RVR-then we will place back on Cardizem infusion.  Remains on full dose IV heparin given prior history of CVA in the past.   Minimally elevated troponin: Trend is flat per documentation from [facility]-likely secondary demand ischemia.  Note-04/04/18 TTE: EF 60-65%-with no regional wall motion abnormality (done at [place])  Hypokalemia:  Repleted.  Hypernatremia:  Improved-we will order gentle hydration with D5W for a few hours today.  AKI:  Improving-likely hemodynamically mediated.  Follow.  Transaminitis:  Mild transaminitis continues-this is secondary to COVID-19.  Follow.  Hypothyroidism: Continue Synthroid  History of CVA: On IV heparin given new onset A. fib.  Plavix has been discontinued-as there is no indication for Plavix at this point-especially now that she is on anticoagulation.    History of CLL: Per documentation from [place]- baseline WBC count around 20-25,000-currently significantly more than baseline as patient on steroids.  Will follow closely.  GI prophylaxis: PPI  Consults  :  None  Procedures  :  None/  ABG: No results found for: PHART, PCO2ART, PO2ART, HCO3, TCO2, ACIDBASEDEF, O2SAT  Vent Settings: N/A  Condition - Extremely Guarded  Family Communication  : Son updated over the phone  Code Status :  Full Code  Diet :  Diet Order            Diet heart healthy/carb modified Room service appropriate? Yes; Fluid consistency:  Thin  Diet effective now               Disposition Plan  :  Remain hospitalized  Barriers to discharge: Hypoxia requiring O2 supplementation  Antimicorbials  :    Anti-infectives (From admission, onward)   Start     Dose/Rate Route Frequency Ordered Stop   04/07/19 1000  remdesivir 100 mg in sodium chloride 0.9 % 100 mL IVPB  Status:  Discontinued     100 mg 200 mL/hr over 30 Minutes Intravenous Daily 04/06/19 0719 04/06/19 0731   04/06/19 1630  remdesivir 100 mg in sodium chloride 0.9 % 100 mL IVPB     100 mg 200 mL/hr over 30 Minutes Intravenous Daily 04/06/19 0733 04/08/19 1108   04/06/19 1330  cefTRIAXone (ROCEPHIN) 1 g in sodium chloride 0.9 % 100 mL IVPB     1 g 200 mL/hr over 30 Minutes Intravenous Every 24 hours 04/06/19 1251 04/08/19 1358   04/06/19 1330  azithromycin (ZITHROMAX) tablet 500 mg     500 mg Oral Daily 04/06/19 1251 04/06/19 1510   04/06/19 0730  remdesivir 200 mg in sodium chloride 0.9% 250 mL IVPB  Status:  Discontinued     200 mg 580 mL/hr over 30 Minutes Intravenous Once 04/06/19 0719 04/06/19 0731      Inpatient Medications  Scheduled Meds: . albuterol  2 puff Inhalation Q6H  . vitamin C  500 mg Oral Daily  . Chlorhexidine Gluconate Cloth  6 each Topical Q0600  . feeding supplement (ENSURE ENLIVE)  237 mL Oral BID BM  . fluticasone  2 spray Each Nare Daily  . folic acid  1 mg Oral Daily  . insulin aspart  0-9 Units Subcutaneous TID WC  . levothyroxine  50 mcg Oral QAC breakfast  . methylPREDNISolone (SOLU-MEDROL) injection  40 mg Intravenous Q12H  . metoprolol tartrate  100 mg Oral BID  . pantoprazole  40 mg Oral Daily  . sodium chloride flush  3 mL  Intravenous Q12H  . sodium chloride flush  3 mL Intravenous Q12H  . zinc sulfate  220 mg Oral Daily   Continuous Infusions: . sodium chloride Stopped (04/07/19 0030)  . dextrose 60 mL/hr at 04/09/19 0848  . heparin 500 Units/hr (04/08/19 1946)   PRN Meds:.sodium chloride, acetaminophen,  chlorpheniramine-HYDROcodone, diltiazem, guaiFENesin-dextromethorphan, menthol-cetylpyridinium, ondansetron **OR** ondansetron (ZOFRAN) IV, oxymetazoline, polyethylene glycol, sodium chloride, sodium chloride flush   Time Spent in minutes  35   See all Orders from today for further details  Oren Binet M.D on 04/09/2019 at 2:36 PM  To page go to [email or web address] - use universal password  [facility] -  Office  [phone]    Objective:   Vitals:   04/09/19 0738 04/09/19 0921 04/09/19 0933 04/09/19 1156  BP: 128/77     Pulse: 100  (!) 119 100  Resp: (!) 22 (!) 28 (!) 24 (!) 22  Temp: 98.6 F (37 C)     TempSrc: Axillary     SpO2: 91%  90%   Weight:      Height:        Wt Readings from Last 3 Encounters:  04/06/19 81.6 kg     Intake/Output Summary (Last 24 hours) at 04/09/2019 1436 Last data filed at 04/09/2019 0848 Gross per 24 hour  Intake 1127.91 ml  Output 830 ml  Net 297.91 ml     Physical Exam Gen Exam:Alert awake-not in any distress HEENT:atraumatic, normocephalic Chest: B/L clear to auscultation anteriorly CVS:S1S2 regular Abdomen:soft non tender, non distended Extremities:no edema Neurology: Non focal Skin: no rash   Data Review:    CBC Recent Labs  Lab 04/06/19 1000 04/07/19 0455 04/08/19 0508 04/09/19 0500  WBC 68.3* 72.0* 46.3* 70.8*  HGB 13.1 13.5 12.0 12.4  HCT 39.6 41.1 37.3 38.7  PLT 306 310 255 275  MCV 90.6 91.7 91.6 92.1  MCH 30.0 30.1 29.5 29.5  MCHC 33.1 32.8 32.2 32.0  RDW 14.6 14.9 15.1 14.9  LYMPHSABS 54.0* 59.7* 35.5* 53.2*  MONOABS 2.5* 2.7* 1.7* 2.3*  EOSABS 0.0 0.0 0.0 0.0  BASOSABS 0.1 0.2* 0.1 0.2*    Chemistries  Recent Labs  Lab 04/06/19 1000 04/07/19 0455 04/08/19 0508 04/09/19 0500  NA 147* 146* 150* 147*  K 3.0* 3.1* 3.3* 4.6  CL 112* 109 115* 111  CO2 21* 23 24 26   GLUCOSE 146* 169* 169* 147*  BUN 54* 50* 52* 53*  CREATININE 1.57* 1.47* 1.31* 1.30*  CALCIUM 8.5* 8.5* 8.6* 8.6*  MG  --   --    --  2.1  AST 63* 63* 70* 54*  ALT 64* 63* 75* 65*  ALKPHOS 73 75 78 87  BILITOT 0.7 0.6 0.7 0.9   ------------------------------------------------------------------------------------------------------------------ No results for input(s): CHOL, HDL, LDLCALC, TRIG, CHOLHDL, LDLDIRECT in the last 72 hours.  Lab Results  Component Value Date   HGBA1C 6.1 (H) 04/06/2019   ------------------------------------------------------------------------------------------------------------------ No results for input(s): TSH, T4TOTAL, T3FREE, THYROIDAB in the last 72 hours.  Invalid input(s): FREET3 ------------------------------------------------------------------------------------------------------------------ Recent Labs    04/08/19 0508 04/09/19 0500  FERRITIN 1,910* 2,180*    Coagulation profile No results for input(s): INR, PROTIME in the last 168 hours.  Recent Labs    04/08/19 0508 04/09/19 0500  DDIMER 0.73* 0.60*    Cardiac Enzymes No results for input(s): CKMB, TROPONINI, MYOGLOBIN in the last 168 hours.  Invalid input(s): CK ------------------------------------------------------------------------------------------------------------------    Component Value Date/Time   BNP 151.2 (H) 04/06/2019 1000    Micro Results No  results found for this or any previous visit (from the past 240 hour(s)).  Radiology Reports [place] EKG SITE RITE  Result Date: 04/08/2019 If Site Rite image not attached, placement could not be confirmed due to current cardiac rhythm.  [place] EKG SITE RITE  Result Date: 04/06/2019 If Site Rite image not attached, placement could not be confirmed due to current cardiac rhythm.

## 2019-04-09 NOTE — Progress Notes (Signed)
ANTICOAGULATION CONSULT NOTE  Pharmacy Consult for heparin Indication: atrial fibrillation  Heparin Dosing Weight: 76.4 kg  Labs: Recent Labs    04/07/19 0455 04/08/19 0508 04/09/19 0500 04/09/19 1204 04/09/19 1448  HGB 13.5 12.0 12.4  --   --   HCT 41.1 37.3 38.7  --   --   PLT 310 255 275  --   --   HEPARINUNFRC 1.54* 0.87* 0.66 0.73* 0.65  CREATININE 1.47* 1.31* 1.30*  --   --     Assessment: 73 yof COVID-19 positive on 03/30/19 presenting with afib with RVR. Pharmacy consulted to dose heparin. Patient is not on anticoagulation PTA but was on Lovenox 80mg /day (for CrCl<30 initially) at [facility] prior to transfer, last dose 1/5 at 1630 per shadow chart. Lovenox prophylactic dose ordered at [facility] 1/6 but d/c'd prior to being given. SCr trended down from 2.2>1.8 at [facility]. Labs still pending on transfer. No active bleed issues documented. Will start heparin now, as has been 12 hours since last Lovenox dose and last CrCl improved to >30.  Heparin level came back therapeutic this PM. We will continue with the same rate and f/u with level in AM.   Goal of Therapy:  Heparin level 0.3-0.7 units/ml Monitor platelets by anticoagulation protocol: Yes   Plan:  Continue heparin  500 units/h Monitor daily heparin level and CBC, s/sx bleeding  Onnie Boer, PharmD, BCIDP, AAHIVP, CPP Infectious Disease Pharmacist 04/09/2019 4:46 PM

## 2019-04-10 ENCOUNTER — Encounter (HOSPITAL_COMMUNITY): Payer: Self-pay | Admitting: Internal Medicine

## 2019-04-10 LAB — COMPREHENSIVE METABOLIC PANEL WITH GFR
ALT: 66 U/L — ABNORMAL HIGH (ref 0–44)
AST: 44 U/L — ABNORMAL HIGH (ref 15–41)
Albumin: 3.2 g/dL — ABNORMAL LOW (ref 3.5–5.0)
Alkaline Phosphatase: 92 U/L (ref 38–126)
Anion gap: 10 (ref 5–15)
BUN: 55 mg/dL — ABNORMAL HIGH (ref 8–23)
CO2: 29 mmol/L (ref 22–32)
Calcium: 8.9 mg/dL (ref 8.9–10.3)
Chloride: 107 mmol/L (ref 98–111)
Creatinine, Ser: 1.32 mg/dL — ABNORMAL HIGH (ref 0.44–1.00)
GFR calc Af Amer: 46 mL/min — ABNORMAL LOW
GFR calc non Af Amer: 40 mL/min — ABNORMAL LOW
Glucose, Bld: 167 mg/dL — ABNORMAL HIGH (ref 70–99)
Potassium: 4.4 mmol/L (ref 3.5–5.1)
Sodium: 146 mmol/L — ABNORMAL HIGH (ref 135–145)
Total Bilirubin: 1.5 mg/dL — ABNORMAL HIGH (ref 0.3–1.2)
Total Protein: 5.9 g/dL — ABNORMAL LOW (ref 6.5–8.1)

## 2019-04-10 LAB — CBC WITH DIFFERENTIAL/PLATELET
Abs Immature Granulocytes: 1.21 10*3/uL — ABNORMAL HIGH (ref 0.00–0.07)
Basophils Absolute: 0.1 10*3/uL (ref 0.0–0.1)
Basophils Relative: 0 %
Eosinophils Absolute: 0 10*3/uL (ref 0.0–0.5)
Eosinophils Relative: 0 %
HCT: 38.9 % (ref 36.0–46.0)
Hemoglobin: 12.3 g/dL (ref 12.0–15.0)
Immature Granulocytes: 1 %
Lymphocytes Relative: 75 %
Lymphs Abs: 73.8 10*3/uL — ABNORMAL HIGH (ref 0.7–4.0)
MCH: 29.3 pg (ref 26.0–34.0)
MCHC: 31.6 g/dL (ref 30.0–36.0)
MCV: 92.6 fL (ref 80.0–100.0)
Monocytes Absolute: 3.2 10*3/uL — ABNORMAL HIGH (ref 0.1–1.0)
Monocytes Relative: 3 %
Neutro Abs: 20.8 10*3/uL — ABNORMAL HIGH (ref 1.7–7.7)
Neutrophils Relative %: 21 %
Platelets: 302 10*3/uL (ref 150–400)
RBC: 4.2 MIL/uL (ref 3.87–5.11)
RDW: 14.8 % (ref 11.5–15.5)
WBC Morphology: ABNORMAL
WBC: 99.1 10*3/uL (ref 4.0–10.5)
nRBC: 0 % (ref 0.0–0.2)

## 2019-04-10 LAB — GLUCOSE, CAPILLARY
Glucose-Capillary: 82 mg/dL (ref 70–99)
Glucose-Capillary: 119 mg/dL — ABNORMAL HIGH (ref 70–99)
Glucose-Capillary: 165 mg/dL — ABNORMAL HIGH (ref 70–99)
Glucose-Capillary: 112 mg/dL — ABNORMAL HIGH (ref 70–99)

## 2019-04-10 LAB — D-DIMER, QUANTITATIVE: D-Dimer, Quant: 0.52 ug{FEU}/mL — ABNORMAL HIGH (ref 0.00–0.50)

## 2019-04-10 LAB — FERRITIN: Ferritin: 2184 ng/mL — ABNORMAL HIGH (ref 11–307)

## 2019-04-10 LAB — C-REACTIVE PROTEIN: CRP: 1.2 mg/dL — ABNORMAL HIGH

## 2019-04-10 LAB — HEPARIN LEVEL (UNFRACTIONATED): Heparin Unfractionated: 0.76 [IU]/mL — ABNORMAL HIGH (ref 0.30–0.70)

## 2019-04-10 MED ORDER — DEXTROSE 5 % IV SOLN: INTRAVENOUS | Status: AC

## 2019-04-10 MED ORDER — APIXABAN 5 MG PO TABS
5.0000 mg | ORAL_TABLET | Freq: Two times a day (BID) | ORAL | Status: DC
  Administered 2019-04-10 – 2019-04-18 (×17): 5 mg via ORAL
  Filled 2019-04-10 (×17): qty 1

## 2019-04-10 NOTE — Progress Notes (Signed)
ANTICOAGULATION CONSULT NOTE  Pharmacy Consult for heparin Indication: atrial fibrillation  Heparin Dosing Weight: 76.4 kg  Labs: Recent Labs    04/08/19 0508 04/09/19 0500 04/09/19 1204 04/09/19 1448 04/10/19 0340  HGB 12.0 12.4  --   --  12.3  HCT 37.3 38.7  --   --  38.9  PLT 255 275  --   --  302  HEPARINUNFRC 0.87* 0.66 0.73* 0.65 0.76*  CREATININE 1.31* 1.30*  --   --  1.32*    Assessment: 73 yof COVID-19 positive on 03/30/19 presenting with afib with RVR. Pharmacy consulted to dose heparin.  Heparin level slightly supratherapeutic (0.76) on gtt at 500 units/hr. No bleeding noted.  Goal of Therapy:  Heparin level 0.3-0.7 units/ml Monitor platelets by anticoagulation protocol: Yes   Plan:  Decrease heparin to 400 units/h F/u 8hr heparin level  Sherlon Handing, PharmD, BCPS Please see amion for complete clinical pharmacist phone list 04/10/2019 6:04 AM

## 2019-04-10 NOTE — Plan of Care (Signed)
  Problem: Education: Goal: Knowledge of risk factors and measures for prevention of condition will improve Outcome: Progressing   Problem: Coping: Goal: Psychosocial and spiritual needs will be supported Outcome: Progressing   Problem: Respiratory: Goal: Will maintain a patent airway Outcome: Progressing Goal: Complications related to the disease process, condition or treatment will be avoided or minimized Outcome: Progressing   Problem: Education: Goal: Knowledge of General Education information will improve Description: Including pain rating scale, medication(s)/side effects and non-pharmacologic comfort measures Outcome: Progressing   Problem: Health Behavior/Discharge Planning: Goal: Ability to manage health-related needs will improve Outcome: Progressing   Problem: Education: Goal: Knowledge of General Education information will improve Description: Including pain rating scale, medication(s)/side effects and non-pharmacologic comfort measures Outcome: Progressing   Problem: Health Behavior/Discharge Planning: Goal: Ability to manage health-related needs will improve Outcome: Progressing   Problem: Clinical Measurements: Goal: Ability to maintain clinical measurements within normal limits will improve Outcome: Progressing Goal: Will remain free from infection Outcome: Progressing Goal: Diagnostic test results will improve Outcome: Progressing Goal: Respiratory complications will improve Outcome: Progressing Goal: Cardiovascular complication will be avoided Outcome: Progressing   Problem: Activity: Goal: Risk for activity intolerance will decrease Outcome: Progressing   Problem: Nutrition: Goal: Adequate nutrition will be maintained Outcome: Progressing   Problem: Coping: Goal: Level of anxiety will decrease Outcome: Progressing   Problem: Elimination: Goal: Will not experience complications related to bowel motility Outcome: Progressing Goal: Will not  experience complications related to urinary retention Outcome: Progressing   Problem: Pain Managment: Goal: General experience of comfort will improve Outcome: Progressing   Problem: Safety: Goal: Ability to remain free from injury will improve Outcome: Progressing   Problem: Skin Integrity: Goal: Risk for impaired skin integrity will decrease Outcome: Progressing   Problem: Skin Integrity: Goal: Risk for impaired skin integrity will decrease Outcome: Progressing

## 2019-04-10 NOTE — Progress Notes (Addendum)
PROGRESS NOTE                                                                                                                                                                                                             Patient Demographics:    Cynthia Gardner, is a 74 y.o. female, DOB - [birth year], [id number]  Outpatient Primary MD for the patient is Cox, Elnita Maxwell, MD   Admit date - 04/06/2019   LOS - 4  No chief complaint on file.      Brief Narrative: Patient is a 74 y.o. 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 fib with RVR in the setting of COVID-19 pneumonia.  She was then transferred to the hospitalist service at [facility] on 1/6.  Please see below for further details.   Subjective:   Feels better 12-13 L of high flow oxygen.    Assessment  & Plan :   Acute Hypoxic Resp Failure due to Covid 19 Viral pneumonia: Seems to be slowly improving-down to 12-13 L of high flow oxygen (was on 15 L yesterday-and on HFNC + NRB on admission).  Course of remdesivir and empiric antimicrobial therapy (for minimally elevated procalcitonin-unlikely to have bacterial pneumonia).  Continue steroids-but will start taper.  Fever: afebrile  O2 requirements:  SpO2: 94 % O2 Flow Rate (L/min): 15 L/min   COVID-19 Labs: Recent Labs    04/08/19 0508 04/09/19 0500 04/10/19 0340  DDIMER 0.73* 0.60* 0.52*  FERRITIN 1,910* 2,180*  --   CRP 2.8* 1.9*  --        Component Value Date/Time   BNP 151.2 (H) 04/06/2019 1000    Recent Labs  Lab 04/06/19 1000  PROCALCITON 0.31    No results found for: SARSCOV2NAA   COVID-19 medications: Remdesivir: 1/4>>/8 Steroids:  1/4>> Actemra: 1/5 at [facility]  Other medications: Rocephin:1/4>> 1/8 Zithromax:1/4>>1/6  Prone/Incentive Spirometry: encouraged patient to lie prone for 3-4 hours at a time for a total of 16 hours a day, and to encourage incentive spirometry use 3-4/hour.  DVT Prophylaxis  : Heparin infusion  Atrial fibrillation with RVR: Rate controlled but remains in atrial  fibrillation-continue Lopressor 100 mg twice daily-stop IV heparin and transition to Eliquis.    Minimally elevated troponin: Trend is flat per documentation from [facility]-likely secondary demand ischemia.  Note-04/04/18 TTE: EF 60-65%-with no regional wall motion abnormality (done at [place])  Hypokalemia:  Repleted.  Hypernatremia:  Slowly improving-encourage oral intake-cautiously hydrate with D5W for a few hours today.  AKI:  Improving-likely hemodynamically mediated.  Follow.  Transaminitis:  Mild transaminitis continues-this is secondary to COVID-19.  Follow.  Hypothyroidism: Continue Synthroid  History of CVA: On anticoagulation given new onset A. fib.  Plavix has been discontinued-as there is no indication for Plavix at this point-especially now that she is on anticoagulation.    History of CLL: Per documentation from [place]- baseline WBC count around 20-25,000-currently WBC count close to 100,000.  Spoke with Dr. Benay Spice over the phone on 1/10-recommends supportive care-with steroids-white count can even go higher.  GI prophylaxis: PPI  Consults  :  None  Procedures  :  None/  ABG: No results found for: PHART, PCO2ART, PO2ART, HCO3, TCO2, ACIDBASEDEF, O2SAT  Vent Settings: N/A  Condition - Extremely Guarded  Family Communication  : Son updated over the phone  Code Status :  Full Code  Diet :  Diet Order            Diet heart healthy/carb modified Room service appropriate? Yes; Fluid consistency: Thin  Diet effective now               Disposition Plan  :  Remain hospitalized  Barriers  to discharge: Hypoxia requiring O2 supplementation  Antimicorbials  :    Anti-infectives (From admission, onward)   Start     Dose/Rate Route Frequency Ordered Stop   04/07/19 1000  remdesivir 100 mg in sodium chloride 0.9 % 100 mL IVPB  Status:  Discontinued     100 mg 200 mL/hr over 30 Minutes Intravenous Daily 04/06/19 0719 04/06/19 0731   04/06/19 1630  remdesivir 100 mg in sodium chloride 0.9 % 100 mL IVPB     100 mg 200 mL/hr over 30 Minutes Intravenous Daily 04/06/19 0733 04/08/19 1108   04/06/19 1330  cefTRIAXone (ROCEPHIN) 1 g in sodium chloride 0.9 % 100 mL IVPB     1 g 200 mL/hr over 30 Minutes Intravenous Every 24 hours 04/06/19 1251 04/08/19 1358   04/06/19 1330  azithromycin (ZITHROMAX) tablet 500 mg     500 mg Oral Daily 04/06/19 1251 04/06/19 1510   04/06/19 0730  remdesivir 200 mg in sodium chloride 0.9% 250 mL IVPB  Status:  Discontinued     200 mg 580 mL/hr over 30 Minutes Intravenous Once 04/06/19 0719 04/06/19 0731      Inpatient Medications  Scheduled Meds: . albuterol  2 puff Inhalation Q6H  . apixaban  5 mg Oral BID  . vitamin C  500 mg Oral Daily  . Chlorhexidine Gluconate Cloth  6 each Topical Q0600  . feeding supplement (ENSURE ENLIVE)  237 mL Oral BID BM  . fluticasone  2 spray Each Nare Daily  . folic acid  1 mg Oral Daily  . insulin aspart  0-9 Units Subcutaneous TID WC  . levothyroxine  50 mcg Oral QAC breakfast  . methylPREDNISolone (SOLU-MEDROL) injection  40 mg Intravenous Q12H  . metoprolol tartrate  100 mg Oral BID  . pantoprazole  40 mg Oral Daily  . sodium chloride flush  3 mL Intravenous Q12H  . sodium chloride flush  3 mL Intravenous Q12H  . zinc  sulfate  220 mg Oral Daily   Continuous Infusions: . sodium chloride Stopped (04/07/19 0030)  . dextrose     PRN Meds:.sodium chloride, acetaminophen, chlorpheniramine-HYDROcodone, diltiazem, guaiFENesin-dextromethorphan, menthol-cetylpyridinium, ondansetron **OR** ondansetron (ZOFRAN) IV,  oxymetazoline, polyethylene glycol, sodium chloride, sodium chloride flush   Time Spent in minutes  35   See all Orders from today for further details  Oren Binet M.D on 04/10/2019 at 10:37 AM  To page go to [email or web address] - use universal password  [facility] -  Office  [phone]    Objective:   Vitals:   04/10/19 0747 04/10/19 0748 04/10/19 0800 04/10/19 0930  BP:   110/77 94/62  Pulse:   84 92  Resp: (!) 22 (!) 26 (!) 23 (!) 30  Temp: (!) 97.4 F (36.3 C)  (!) 97.4 F (36.3 C)   TempSrc: Axillary  Axillary   SpO2: 97%  92% 94%  Weight:      Height:        Wt Readings from Last 3 Encounters:  04/06/19 81.6 kg     Intake/Output Summary (Last 24 hours) at 04/10/2019 1037 Last data filed at 04/10/2019 1021 Gross per 24 hour  Intake 1670.59 ml  Output 300 ml  Net 1370.59 ml     Physical Exam Gen Exam:Alert awake-not in any distress HEENT:atraumatic, normocephalic Chest: B/L clear to auscultation anteriorly CVS:S1S2 regular Abdomen:soft non tender, non distended Extremities:no edema Neurology: Non focal Skin: no rash   Data Review:    CBC Recent Labs  Lab 04/06/19 1000 04/07/19 0455 04/08/19 0508 04/09/19 0500 04/10/19 0340  WBC 68.3* 72.0* 46.3* 70.8* 99.1*  HGB 13.1 13.5 12.0 12.4 12.3  HCT 39.6 41.1 37.3 38.7 38.9  PLT 306 310 255 275 302  MCV 90.6 91.7 91.6 92.1 92.6  MCH 30.0 30.1 29.5 29.5 29.3  MCHC 33.1 32.8 32.2 32.0 31.6  RDW 14.6 14.9 15.1 14.9 14.8  LYMPHSABS 54.0* 59.7* 35.5* 53.2* 73.8*  MONOABS 2.5* 2.7* 1.7* 2.3* 3.2*  EOSABS 0.0 0.0 0.0 0.0 0.0  BASOSABS 0.1 0.2* 0.1 0.2* 0.1    Chemistries  Recent Labs  Lab 04/06/19 1000 04/07/19 0455 04/08/19 0508 04/09/19 0500 04/10/19 0340  NA 147* 146* 150* 147* 146*  K 3.0* 3.1* 3.3* 4.6 4.4  CL 112* 109 115* 111 107  CO2 21* 23 24 26 29   GLUCOSE 146* 169* 169* 147* 167*  BUN 54* 50* 52* 53* 55*  CREATININE 1.57* 1.47* 1.31* 1.30* 1.32*  CALCIUM 8.5* 8.5* 8.6*  8.6* 8.9  MG  --   --   --  2.1  --   AST 63* 63* 70* 54* 44*  ALT 64* 63* 75* 65* 66*  ALKPHOS 73 75 78 87 92  BILITOT 0.7 0.6 0.7 0.9 1.5*   ------------------------------------------------------------------------------------------------------------------ No results for input(s): CHOL, HDL, LDLCALC, TRIG, CHOLHDL, LDLDIRECT in the last 72 hours.  Lab Results  Component Value Date   HGBA1C 6.1 (H) 04/06/2019   ------------------------------------------------------------------------------------------------------------------ No results for input(s): TSH, T4TOTAL, T3FREE, THYROIDAB in the last 72 hours.  Invalid input(s): FREET3 ------------------------------------------------------------------------------------------------------------------ Recent Labs    04/08/19 0508 04/09/19 0500  FERRITIN 1,910* 2,180*    Coagulation profile No results for input(s): INR, PROTIME in the last 168 hours.  Recent Labs    04/09/19 0500 04/10/19 0340  DDIMER 0.60* 0.52*    Cardiac Enzymes No results for input(s): CKMB, TROPONINI, MYOGLOBIN in the last 168 hours.  Invalid input(s): CK ------------------------------------------------------------------------------------------------------------------    Component Value Date/Time   BNP  151.2 (H) 04/06/2019 1000    Micro Results No results found for this or any previous visit (from the past 240 hour(s)).  Radiology Reports [place] EKG SITE RITE  Result Date: 04/08/2019 If Site Rite image not attached, placement could not be confirmed due to current cardiac rhythm.  [place] EKG SITE RITE  Result Date: 04/06/2019 If Site Rite image not attached, placement could not be confirmed due to current cardiac rhythm.

## 2019-04-11 ENCOUNTER — Encounter (HOSPITAL_COMMUNITY): Payer: Self-pay | Admitting: Internal Medicine

## 2019-04-11 LAB — CBC WITH DIFFERENTIAL/PLATELET
Abs Immature Granulocytes: 1.72 10*3/uL — ABNORMAL HIGH (ref 0.00–0.07)
Basophils Absolute: 0.2 10*3/uL — ABNORMAL HIGH (ref 0.0–0.1)
Basophils Relative: 0 %
Eosinophils Absolute: 0 10*3/uL (ref 0.0–0.5)
Eosinophils Relative: 0 %
HCT: 38.4 % (ref 36.0–46.0)
Hemoglobin: 11.9 g/dL — ABNORMAL LOW (ref 12.0–15.0)
Immature Granulocytes: 2 %
Lymphocytes Relative: 73 %
Lymphs Abs: 79.6 10*3/uL — ABNORMAL HIGH (ref 0.7–4.0)
MCH: 29.1 pg (ref 26.0–34.0)
MCHC: 31 g/dL (ref 30.0–36.0)
MCV: 93.9 fL (ref 80.0–100.0)
Monocytes Absolute: 3 10*3/uL — ABNORMAL HIGH (ref 0.1–1.0)
Monocytes Relative: 3 %
Neutro Abs: 23.7 10*3/uL — ABNORMAL HIGH (ref 1.7–7.7)
Neutrophils Relative %: 22 %
Platelets: 298 10*3/uL (ref 150–400)
RBC: 4.09 MIL/uL (ref 3.87–5.11)
RDW: 14.6 % (ref 11.5–15.5)
WBC Morphology: ABNORMAL
WBC: 108.2 10*3/uL (ref 4.0–10.5)
nRBC: 0 % (ref 0.0–0.2)

## 2019-04-11 LAB — COMPREHENSIVE METABOLIC PANEL WITH GFR
ALT: 55 U/L — ABNORMAL HIGH (ref 0–44)
AST: 41 U/L (ref 15–41)
Albumin: 3.2 g/dL — ABNORMAL LOW (ref 3.5–5.0)
Alkaline Phosphatase: 81 U/L (ref 38–126)
Anion gap: 14 (ref 5–15)
BUN: 61 mg/dL — ABNORMAL HIGH (ref 8–23)
CO2: 28 mmol/L (ref 22–32)
Calcium: 8.4 mg/dL — ABNORMAL LOW (ref 8.9–10.3)
Chloride: 106 mmol/L (ref 98–111)
Creatinine, Ser: 1.24 mg/dL — ABNORMAL HIGH (ref 0.44–1.00)
GFR calc Af Amer: 50 mL/min — ABNORMAL LOW
GFR calc non Af Amer: 43 mL/min — ABNORMAL LOW
Glucose, Bld: 142 mg/dL — ABNORMAL HIGH (ref 70–99)
Potassium: 4.6 mmol/L (ref 3.5–5.1)
Sodium: 148 mmol/L — ABNORMAL HIGH (ref 135–145)
Total Bilirubin: 1.6 mg/dL — ABNORMAL HIGH (ref 0.3–1.2)
Total Protein: 5.5 g/dL — ABNORMAL LOW (ref 6.5–8.1)

## 2019-04-11 LAB — D-DIMER, QUANTITATIVE: D-Dimer, Quant: 0.6 ug{FEU}/mL — ABNORMAL HIGH (ref 0.00–0.50)

## 2019-04-11 LAB — GLUCOSE, CAPILLARY
Glucose-Capillary: 134 mg/dL — ABNORMAL HIGH (ref 70–99)
Glucose-Capillary: 118 mg/dL — ABNORMAL HIGH (ref 70–99)
Glucose-Capillary: 112 mg/dL — ABNORMAL HIGH (ref 70–99)
Glucose-Capillary: 169 mg/dL — ABNORMAL HIGH (ref 70–99)
Glucose-Capillary: 165 mg/dL — ABNORMAL HIGH (ref 70–99)

## 2019-04-11 LAB — FERRITIN: Ferritin: 1776 ng/mL — ABNORMAL HIGH (ref 11–307)

## 2019-04-11 LAB — C-REACTIVE PROTEIN: CRP: 0.8 mg/dL

## 2019-04-11 MED ORDER — DEXTROSE 5 % IV SOLN: INTRAVENOUS | Status: DC

## 2019-04-11 NOTE — Progress Notes (Addendum)
PROGRESS NOTE                                                                                                                                                                                                             Patient Demographics:    Cynthia Gardner, is a 74 y.o. female, DOB - 1945/06/05, [id number]  Outpatient Primary MD for the patient is Cox, Elnita Maxwell, MD   Admit date - 04/06/2019   LOS - 5  No chief complaint on file.      Brief Narrative: Patient is a 74 y.o. 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 fib with RVR in the setting of COVID-19 pneumonia.  She was then transferred to the hospitalist service at [facility] on 1/6.  Please see below for further details.   Subjective:   Feels better but still on 10-13 L of high flow oxygen.  Inquiring about when she can go home.   Assessment  & Plan :   Acute Hypoxic Resp Failure due to Covid 19 Viral pneumonia: Essentially unchanged from yesterday-still with severe hypoxemia-anywhere from 10-13 L of HFNC this morning.  However seems to have improved compared to on admission when she was requiring both HFNC and NRB.  Continue steroids but taper.  Has completed a course of remdesivir.  Has also completed a course of IV antibiotics for a minimally elevated procalcitonin level-although unlikely to have bacterial pneumonia.  Fever: afebrile  O2 requirements:  SpO2: 90 % O2 Flow Rate (L/min): 15 L/min   COVID-19 Labs: Recent Labs    04/09/19 0500 04/10/19 0340 04/11/19 0415  DDIMER 0.60* 0.52* 0.60*  FERRITIN 2,180* 2,184* 1,776*  CRP 1.9* 1.2* 0.8       Component Value Date/Time   BNP 151.2 (H) 04/06/2019  1000    Recent Labs  Lab 04/06/19 1000  PROCALCITON 0.31    No results found for: SARSCOV2NAA   COVID-19 medications: Remdesivir: 1/4>>/8 Steroids: 1/4>> Actemra: 1/5 at [facility]  Other medications: Rocephin:1/4>> 1/8 Zithromax:1/4>>1/6  Prone/Incentive Spirometry: encouraged patient to lie prone for 3-4 hours at a time for a total of 16 hours a day,  and to encourage incentive spirometry use 3-4/hour.  DVT Prophylaxis  : Heparin infusion  Atrial fibrillation with RVR: Rate controlled but remains in atrial fibrillation-continue Lopressor 100 mg twice daily-no longer on IV heparin-has been transitioned to Eliquis.  Minimally elevated troponin: Trend is flat per documentation from [facility]-likely secondary demand ischemia.  Note-04/04/18 TTE: EF 60-65%-with no regional wall motion abnormality (done at [place])  Hypokalemia:  Repleted.  Hypernatremia:  Has been having fluctuating levels of hyponatremia over the past few days-restart D5W today.  AKI:  Improving-likely hemodynamically mediated.  Follow.  Transaminitis:  Mild transaminitis continues-this is secondary to COVID-19.  Follow.  Hypothyroidism: Continue Synthroid  History of CVA: On anticoagulation given new onset A. fib.  Plavix has been discontinued-as there is no indication for Plavix at this point-especially now that she is on anticoagulation.    History of CLL: Per documentation from [place]- baseline WBC count around 20-25,000-currently WBC count close to 100,000.  Spoke with Dr. Benay Spice over the phone on 1/10-recommends supportive care-with steroids-white count can even go higher.  GI prophylaxis: PPI  Consults  :  None  Procedures  :  None/  ABG: No results found for: PHART, PCO2ART, PO2ART, HCO3, TCO2, ACIDBASEDEF, O2SAT  Vent Settings: N/A  Condition - Extremely Guarded  Family Communication  : Son updated over the phone on 1/11  Code Status :  Full Code  Diet :  Diet Order             Diet heart healthy/carb modified Room service appropriate? Yes; Fluid consistency: Thin  Diet effective now               Disposition Plan  :  Remain hospitalized  Barriers to discharge: Hypoxia requiring O2 supplementation  Antimicorbials  :    Anti-infectives (From admission, onward)   Start     Dose/Rate Route Frequency Ordered Stop   04/07/19 1000  remdesivir 100 mg in sodium chloride 0.9 % 100 mL IVPB  Status:  Discontinued     100 mg 200 mL/hr over 30 Minutes Intravenous Daily 04/06/19 0719 04/06/19 0731   04/06/19 1630  remdesivir 100 mg in sodium chloride 0.9 % 100 mL IVPB     100 mg 200 mL/hr over 30 Minutes Intravenous Daily 04/06/19 0733 04/08/19 1108   04/06/19 1330  cefTRIAXone (ROCEPHIN) 1 g in sodium chloride 0.9 % 100 mL IVPB     1 g 200 mL/hr over 30 Minutes Intravenous Every 24 hours 04/06/19 1251 04/08/19 1358   04/06/19 1330  azithromycin (ZITHROMAX) tablet 500 mg     500 mg Oral Daily 04/06/19 1251 04/06/19 1510   04/06/19 0730  remdesivir 200 mg in sodium chloride 0.9% 250 mL IVPB  Status:  Discontinued     200 mg 580 mL/hr over 30 Minutes Intravenous Once 04/06/19 0719 04/06/19 0731      Inpatient Medications  Scheduled Meds: . albuterol  2 puff Inhalation Q6H  . apixaban  5 mg Oral BID  . vitamin C  500 mg Oral Daily  . Chlorhexidine Gluconate Cloth  6 each Topical Q0600  . feeding supplement (ENSURE ENLIVE)  237 mL Oral BID BM  . fluticasone  2 spray Each Nare Daily  . folic acid  1 mg Oral Daily  . insulin aspart  0-9 Units Subcutaneous TID WC  . levothyroxine  50 mcg Oral QAC breakfast  . methylPREDNISolone (SOLU-MEDROL) injection  40 mg Intravenous Q12H  . metoprolol tartrate  100 mg Oral BID  .  pantoprazole  40 mg Oral Daily  . sodium chloride flush  3 mL Intravenous Q12H  . sodium chloride flush  3 mL Intravenous Q12H  . zinc sulfate  220 mg Oral Daily   Continuous Infusions: . sodium chloride Stopped (04/07/19 0030)  . dextrose 50  mL/hr at 04/11/19 1057   PRN Meds:.sodium chloride, acetaminophen, chlorpheniramine-HYDROcodone, diltiazem, guaiFENesin-dextromethorphan, menthol-cetylpyridinium, ondansetron **OR** ondansetron (ZOFRAN) IV, oxymetazoline, polyethylene glycol, sodium chloride, sodium chloride flush   Time Spent in minutes  35   See all Orders from today for further details  Oren Binet M.D on 04/11/2019 at 1:25 PM  To page go to [email or web address] - use universal password  [facility] -  Office  [phone]    Objective:   Vitals:   04/10/19 2055 04/11/19 0000 04/11/19 0417 04/11/19 0729  BP: 135/86  108/68 (!) 140/113  Pulse: 98  96 86  Resp:  20 20 20   Temp:  97.6 F (36.4 C) 97.8 F (36.6 C) 97.9 F (36.6 C)  TempSrc:  Axillary Oral Oral  SpO2:   90% 90%  Weight:      Height:        Wt Readings from Last 3 Encounters:  04/06/19 81.6 kg     Intake/Output Summary (Last 24 hours) at 04/11/2019 1325 Last data filed at 04/11/2019 0500 Gross per 24 hour  Intake 480 ml  Output 650 ml  Net -170 ml     Physical Exam Gen Exam:Alert awake-not in any distress HEENT:atraumatic, normocephalic Chest: B/L clear to auscultation anteriorly CVS:S1S2 regular Abdomen:soft non tender, non distended Extremities:no edema Neurology: Non focal Skin: no rash   Data Review:    CBC Recent Labs  Lab 04/07/19 0455 04/08/19 0508 04/09/19 0500 04/10/19 0340 04/11/19 0415  WBC 72.0* 46.3* 70.8* 99.1* 108.2*  HGB 13.5 12.0 12.4 12.3 11.9*  HCT 41.1 37.3 38.7 38.9 38.4  PLT 310 255 275 302 298  MCV 91.7 91.6 92.1 92.6 93.9  MCH 30.1 29.5 29.5 29.3 29.1  MCHC 32.8 32.2 32.0 31.6 31.0  RDW 14.9 15.1 14.9 14.8 14.6  LYMPHSABS 59.7* 35.5* 53.2* 73.8* 79.6*  MONOABS 2.7* 1.7* 2.3* 3.2* 3.0*  EOSABS 0.0 0.0 0.0 0.0 0.0  BASOSABS 0.2* 0.1 0.2* 0.1 0.2*    Chemistries  Recent Labs  Lab 04/07/19 0455 04/08/19 0508 04/09/19 0500 04/10/19 0340 04/11/19 0415  NA 146* 150* 147* 146* 148*   K 3.1* 3.3* 4.6 4.4 4.6  CL 109 115* 111 107 106  CO2 23 24 26 29 28   GLUCOSE 169* 169* 147* 167* 142*  BUN 50* 52* 53* 55* 61*  CREATININE 1.47* 1.31* 1.30* 1.32* 1.24*  CALCIUM 8.5* 8.6* 8.6* 8.9 8.4*  MG  --   --  2.1  --   --   AST 63* 70* 54* 44* 41  ALT 63* 75* 65* 66* 55*  ALKPHOS 75 78 87 92 81  BILITOT 0.6 0.7 0.9 1.5* 1.6*   ------------------------------------------------------------------------------------------------------------------ No results for input(s): CHOL, HDL, LDLCALC, TRIG, CHOLHDL, LDLDIRECT in the last 72 hours.  Lab Results  Component Value Date   HGBA1C 6.1 (H) 04/06/2019   ------------------------------------------------------------------------------------------------------------------ No results for input(s): TSH, T4TOTAL, T3FREE, THYROIDAB in the last 72 hours.  Invalid input(s): FREET3 ------------------------------------------------------------------------------------------------------------------ Recent Labs    04/10/19 0340 04/11/19 0415  FERRITIN 2,184* 1,776*    Coagulation profile No results for input(s): INR, PROTIME in the last 168 hours.  Recent Labs    04/10/19 0340 04/11/19 0415  DDIMER 0.52* 0.60*  Cardiac Enzymes No results for input(s): CKMB, TROPONINI, MYOGLOBIN in the last 168 hours.  Invalid input(s): CK ------------------------------------------------------------------------------------------------------------------    Component Value Date/Time   BNP 151.2 (H) 04/06/2019 1000    Micro Results No results found for this or any previous visit (from the past 240 hour(s)).  Radiology Reports [place] EKG SITE RITE  Result Date: 04/08/2019 If Site Rite image not attached, placement could not be confirmed due to current cardiac rhythm.  [place] EKG SITE RITE  Result Date: 04/06/2019 If Site Rite image not attached, placement could not be confirmed due to current cardiac rhythm.

## 2019-04-12 ENCOUNTER — Inpatient Hospital Stay (HOSPITAL_COMMUNITY): Payer: Medicare HMO

## 2019-04-12 DIAGNOSIS — I10 Essential (primary) hypertension: Secondary | ICD-10-CM

## 2019-04-12 LAB — CBC
HCT: 33.8 % — ABNORMAL LOW (ref 36.0–46.0)
Hemoglobin: 10.6 g/dL — ABNORMAL LOW (ref 12.0–15.0)
MCH: 29.4 pg (ref 26.0–34.0)
MCHC: 31.4 g/dL (ref 30.0–36.0)
MCV: 93.9 fL (ref 80.0–100.0)
Platelets: 246 10*3/uL (ref 150–400)
RBC: 3.6 MIL/uL — ABNORMAL LOW (ref 3.87–5.11)
RDW: 14.6 % (ref 11.5–15.5)
WBC: 104.7 10*3/uL (ref 4.0–10.5)
nRBC: 0 % (ref 0.0–0.2)

## 2019-04-12 LAB — COMPREHENSIVE METABOLIC PANEL WITH GFR
ALT: 55 U/L — ABNORMAL HIGH (ref 0–44)
AST: 38 U/L (ref 15–41)
Albumin: 3 g/dL — ABNORMAL LOW (ref 3.5–5.0)
Alkaline Phosphatase: 82 U/L (ref 38–126)
Anion gap: 8 (ref 5–15)
BUN: 61 mg/dL — ABNORMAL HIGH (ref 8–23)
CO2: 29 mmol/L (ref 22–32)
Calcium: 8.5 mg/dL — ABNORMAL LOW (ref 8.9–10.3)
Chloride: 103 mmol/L (ref 98–111)
Creatinine, Ser: 1.14 mg/dL — ABNORMAL HIGH (ref 0.44–1.00)
GFR calc Af Amer: 55 mL/min — ABNORMAL LOW
GFR calc non Af Amer: 47 mL/min — ABNORMAL LOW
Glucose, Bld: 160 mg/dL — ABNORMAL HIGH (ref 70–99)
Potassium: 4.8 mmol/L (ref 3.5–5.1)
Sodium: 140 mmol/L (ref 135–145)
Total Bilirubin: 1.1 mg/dL (ref 0.3–1.2)
Total Protein: 5.2 g/dL — ABNORMAL LOW (ref 6.5–8.1)

## 2019-04-12 LAB — D-DIMER, QUANTITATIVE: D-Dimer, Quant: 1.06 ug{FEU}/mL — ABNORMAL HIGH (ref 0.00–0.50)

## 2019-04-12 LAB — GLUCOSE, CAPILLARY
Glucose-Capillary: 125 mg/dL — ABNORMAL HIGH (ref 70–99)
Glucose-Capillary: 123 mg/dL — ABNORMAL HIGH (ref 70–99)
Glucose-Capillary: 115 mg/dL — ABNORMAL HIGH (ref 70–99)

## 2019-04-12 LAB — C-REACTIVE PROTEIN: CRP: 0.7 mg/dL

## 2019-04-12 LAB — FERRITIN: Ferritin: 1156 ng/mL — ABNORMAL HIGH (ref 11–307)

## 2019-04-12 MED ORDER — METHYLPREDNISOLONE SODIUM SUCC 40 MG IJ SOLR
20.0000 mg | Freq: Two times a day (BID) | INTRAMUSCULAR | Status: DC
  Administered 2019-04-12 – 2019-04-14 (×5): 20 mg via INTRAVENOUS
  Filled 2019-04-12 (×6): qty 1

## 2019-04-12 MED ORDER — ORAL CARE MOUTH RINSE
15.0000 mL | Freq: Two times a day (BID) | OROMUCOSAL | Status: DC
  Administered 2019-04-12 – 2019-04-18 (×12): 15 mL via OROMUCOSAL

## 2019-04-12 NOTE — Progress Notes (Signed)
PROGRESS NOTE                                                                                                                                                                                                             Patient Demographics:    Cynthia Gardner, is a 74 y.o. female, DOB - 05-10-1945, [id number]  Outpatient Primary MD for the patient is Cox, Elnita Maxwell, MD   Admit date - 04/06/2019   LOS - 6  No chief complaint on file.      Brief Narrative: Patient is a 74 y.o. 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 fib with RVR in the setting of COVID-19 pneumonia.  She was then transferred to the hospitalist service at [facility] on 1/6.  Please see below for further details.   Subjective:   On around 10 L of oxygen-slept well-she has no complaints this morning.   Assessment  & Plan :   Acute Hypoxic Resp Failure due to Covid 19 Viral pneumonia: Slow improvement continues-still remains on around 10 L of oxygen this morning.  Although improved compared to on admission-improvement has plateaued for the past few days.  Has completed of remdesivir.  Has also completed a course of IV antibiotics for a minimally elevated procalcitonin level-although unlikely to have bacterial pneumonia.  Continue steroids but continue to slowly taper.  Fever: afebrile  O2 requirements:  SpO2: (!) 88 % O2 Flow Rate (L/min): 10 L/min   COVID-19 Labs: Recent Labs    04/10/19 0340 04/11/19 0415 04/12/19 0550  DDIMER 0.52* 0.60* 1.06*  FERRITIN 2,184* 1,776* 1,156*  CRP 1.2* 0.8 0.7       Component Value Date/Time   BNP 151.2 (H) 04/06/2019 1000    Recent Labs  Lab 04/06/19 1000   PROCALCITON 0.31    No results found for: SARSCOV2NAA   COVID-19 medications: Remdesivir: 1/4>>1/8 Steroids: 1/4>> Actemra: 1/5 at [facility]  Other medications: Rocephin:1/4>> 1/8 Zithromax:1/4>>1/6  Prone/Incentive Spirometry: encouraged patient to lie prone for 3-4 hours at a time for a total of 16 hours a day, and to encourage incentive spirometry use 3-4/hour.  DVT Prophylaxis  :  Heparin infusion  Atrial fibrillation with RVR: Rate controlled but remains in atrial fibrillation-continue Lopressor 100 mg twice daily-no longer on IV heparin-has been transitioned to Eliquis.  Minimally elevated troponin: Trend is flat per documentation from [facility]-likely secondary demand ischemia.  Note-04/04/18 TTE: EF 60-65%-with no regional wall motion abnormality (done at [place])  Hypokalemia:  Repleted.  Hypernatremia:  Resolved-stop D5W.  AKI:  Improving-likely hemodynamically mediated.  Follow.  Transaminitis:  Mild transaminitis continues-this is secondary to COVID-19.  Follow.  Hypothyroidism: Continue Synthroid  History of CVA: On anticoagulation given new onset A. fib.  Plavix has been discontinued-as there is no indication for Plavix at this point-especially now that she is on anticoagulation.    History of CLL: Per documentation from [place]- baseline WBC count around 20-25,000-currently WBC count close to 100,000.  Spoke with Dr. Benay Spice over the phone on 1/10-recommends supportive care-with steroids-white count can even go higher per oncology.  GI prophylaxis: PPI  Consults  :  None  Procedures  :  None/  ABG: No results found for: PHART, PCO2ART, PO2ART, HCO3, TCO2, ACIDBASEDEF, O2SAT  Vent Settings: N/A  Condition - Extremely Guarded  Family Communication  : Son updated over the phone on 1/12  Code Status :  Full Code  Diet :  Diet Order            Diet heart healthy/carb modified Room service appropriate? Yes; Fluid consistency: Thin  Diet effective  now               Disposition Plan  :  Remain hospitalized  Barriers to discharge: Hypoxia requiring O2 supplementation  Antimicorbials  :    Anti-infectives (From admission, onward)   Start     Dose/Rate Route Frequency Ordered Stop   04/07/19 1000  remdesivir 100 mg in sodium chloride 0.9 % 100 mL IVPB  Status:  Discontinued     100 mg 200 mL/hr over 30 Minutes Intravenous Daily 04/06/19 0719 04/06/19 0731   04/06/19 1630  remdesivir 100 mg in sodium chloride 0.9 % 100 mL IVPB     100 mg 200 mL/hr over 30 Minutes Intravenous Daily 04/06/19 0733 04/08/19 1108   04/06/19 1330  cefTRIAXone (ROCEPHIN) 1 g in sodium chloride 0.9 % 100 mL IVPB     1 g 200 mL/hr over 30 Minutes Intravenous Every 24 hours 04/06/19 1251 04/08/19 1358   04/06/19 1330  azithromycin (ZITHROMAX) tablet 500 mg     500 mg Oral Daily 04/06/19 1251 04/06/19 1510   04/06/19 0730  remdesivir 200 mg in sodium chloride 0.9% 250 mL IVPB  Status:  Discontinued     200 mg 580 mL/hr over 30 Minutes Intravenous Once 04/06/19 0719 04/06/19 0731      Inpatient Medications  Scheduled Meds: . albuterol  2 puff Inhalation Q6H  . apixaban  5 mg Oral BID  . vitamin C  500 mg Oral Daily  . Chlorhexidine Gluconate Cloth  6 each Topical Q0600  . feeding supplement (ENSURE ENLIVE)  237 mL Oral BID BM  . fluticasone  2 spray Each Nare Daily  . folic acid  1 mg Oral Daily  . insulin aspart  0-9 Units Subcutaneous TID WC  . levothyroxine  50 mcg Oral QAC breakfast  . methylPREDNISolone (SOLU-MEDROL) injection  40 mg Intravenous Q12H  . metoprolol tartrate  100 mg Oral BID  . pantoprazole  40 mg Oral Daily  . sodium chloride flush  3 mL Intravenous Q12H  . sodium chloride flush  3 mL Intravenous Q12H  . zinc sulfate  220 mg Oral Daily   Continuous Infusions: . sodium chloride Stopped (04/07/19 0030)   PRN Meds:.sodium chloride, acetaminophen, chlorpheniramine-HYDROcodone, diltiazem, guaiFENesin-dextromethorphan,  menthol-cetylpyridinium, ondansetron **OR** ondansetron (ZOFRAN) IV, oxymetazoline, polyethylene glycol, sodium chloride, sodium chloride flush   Time Spent in minutes  25  See all Orders from today for further details  Oren Binet M.D on 04/12/2019 at 12:03 PM  To page go to [email or web address] - use universal password  [facility] -  Office  [phone]    Objective:   Vitals:   04/12/19 0100 04/12/19 0400 04/12/19 0530 04/12/19 0830  BP:  135/69    Pulse: 84   (!) 45  Resp:    (!) 26  Temp:  (!) 97.5 F (36.4 C)  97.6 F (36.4 C)  TempSrc:  Oral Oral   SpO2:  93%  (!) 88%  Weight:      Height:        Wt Readings from Last 3 Encounters:  04/06/19 81.6 kg     Intake/Output Summary (Last 24 hours) at 04/12/2019 1203 Last data filed at 04/12/2019 0900 Gross per 24 hour  Intake 1765.9 ml  Output 550 ml  Net 1215.9 ml     Physical Exam Gen Exam:Alert awake-not in any distress HEENT:atraumatic, normocephalic Chest: B/L clear to auscultation anteriorly CVS:S1S2 regular Abdomen:soft non tender, non distended Extremities:no edema Neurology: Non focal Skin: no rash   Data Review:    CBC Recent Labs  Lab 04/07/19 0455 04/08/19 0508 04/09/19 0500 04/10/19 0340 04/11/19 0415 04/12/19 0550  WBC 72.0* 46.3* 70.8* 99.1* 108.2* 104.7*  HGB 13.5 12.0 12.4 12.3 11.9* 10.6*  HCT 41.1 37.3 38.7 38.9 38.4 33.8*  PLT 310 255 275 302 298 246  MCV 91.7 91.6 92.1 92.6 93.9 93.9  MCH 30.1 29.5 29.5 29.3 29.1 29.4  MCHC 32.8 32.2 32.0 31.6 31.0 31.4  RDW 14.9 15.1 14.9 14.8 14.6 14.6  LYMPHSABS 59.7* 35.5* 53.2* 73.8* 79.6*  --   MONOABS 2.7* 1.7* 2.3* 3.2* 3.0*  --   EOSABS 0.0 0.0 0.0 0.0 0.0  --   BASOSABS 0.2* 0.1 0.2* 0.1 0.2*  --     Chemistries  Recent Labs  Lab 04/08/19 0508 04/09/19 0500 04/10/19 0340 04/11/19 0415 04/12/19 0550  NA 150* 147* 146* 148* 140  K 3.3* 4.6 4.4 4.6 4.8  CL 115* 111 107 106 103  CO2 24 26 29 28 29   GLUCOSE 169*  147* 167* 142* 160*  BUN 52* 53* 55* 61* 61*  CREATININE 1.31* 1.30* 1.32* 1.24* 1.14*  CALCIUM 8.6* 8.6* 8.9 8.4* 8.5*  MG  --  2.1  --   --   --   AST 70* 54* 44* 41 38  ALT 75* 65* 66* 55* 55*  ALKPHOS 78 87 92 81 82  BILITOT 0.7 0.9 1.5* 1.6* 1.1   ------------------------------------------------------------------------------------------------------------------ No results for input(s): CHOL, HDL, LDLCALC, TRIG, CHOLHDL, LDLDIRECT in the last 72 hours.  Lab Results  Component Value Date   HGBA1C 6.1 (H) 04/06/2019   ------------------------------------------------------------------------------------------------------------------ No results for input(s): TSH, T4TOTAL, T3FREE, THYROIDAB in the last 72 hours.  Invalid input(s): FREET3 ------------------------------------------------------------------------------------------------------------------ Recent Labs    04/11/19 0415 04/12/19 0550  FERRITIN 1,776* 1,156*    Coagulation profile No results for input(s): INR, PROTIME in the last 168 hours.  Recent Labs    04/11/19 0415 04/12/19 0550  DDIMER 0.60* 1.06*    Cardiac Enzymes No results for input(s): CKMB,  TROPONINI, MYOGLOBIN in the last 168 hours.  Invalid input(s): CK ------------------------------------------------------------------------------------------------------------------    Component Value Date/Time   BNP 151.2 (H) 04/06/2019 1000    Micro Results No results found for this or any previous visit (from the past 240 hour(s)).  Radiology Reports DG Chest [place] AM  Result Date: 04/12/2019 CLINICAL DATA:  Shortness of breath. EXAM: PORTABLE CHEST 1 VIEW COMPARISON:  04/05/2019.  04/04/2019. FINDINGS: Interim removal of right IJ line. Right PICC line noted with tip over superior vena cava. Heart size stable. Diffuse progressive bilateral pulmonary infiltrates. No pleural effusion. No pneumothorax. IMPRESSION: 1. Interim removal of right IJ line. Right PICC  line noted with tip over superior vena cava. 2.  Diffuse progressive bilateral pulmonary infiltrates. Electronically Signed   By: Marcello Moores  Register   On: 04/12/2019 07:14   [place] EKG SITE RITE  Result Date: 04/08/2019 If Site Rite image not attached, placement could not be confirmed due to current cardiac rhythm.  [place] EKG SITE RITE  Result Date: 04/06/2019 If Site Rite image not attached, placement could not be confirmed due to current cardiac rhythm.

## 2019-04-12 NOTE — Progress Notes (Signed)
Physical Therapy Treatment Patient Details Name: Cynthia Gardner MRN: [id number] DOB: Nov 28, 1945 Today's Date: 04/12/2019    History of Present Illness Patient is a 74 y.o. female with PMHx of CLL, hypothyroidism, remote history of CVA without any focal deficits-who was diagnosed with COVID-19 on 12/30 (per notes from [facility] with shortness of breath-she was found to be profoundly hypoxemic requiring 100% nonrebreather mask along with A. fib with RVR in the setting of COVID-19 pneumonia    PT Comments    Pt continues to demonstrate poor activity tolerance. Stood several times to be cleaned after BM and at that point fatigued and felt faint and unable to actually ambulate. Pt will need to progress with activity to be able to return home. If doesn't progress may need to look at other dc options.    Follow Up Recommendations  Home health PT;Supervision/Assistance - 24 hour(Needs to progress)     Equipment Recommendations  Other (comment)(?rollator)    Recommendations for Other Services       Precautions / Restrictions Precautions Precautions: Fall Precaution Comments: 5-10 HFNC Restrictions Weight Bearing Restrictions: No    Mobility  Bed Mobility Overal bed mobility: Needs Assistance Bed Mobility: Sit to Supine       Sit to supine: Min guard   General bed mobility comments: Assist for lines/safety  Transfers Overall transfer level: Needs assistance Equipment used: 4-wheeled walker;1 person hand held assist Transfers: Sit to/from [facility] Sit to Stand: Min assist Stand pivot transfers: Min assist       General transfer comment: Assist to bring hips up and for balance  Ambulation/Gait             General Gait Details: Attempted but pt fatigued after standing several times for pericare after BM   Stairs             Wheelchair Mobility    Modified Rankin (Stroke Patients Only)       Balance Overall balance  assessment: Needs assistance;History of Falls Sitting-balance support: No upper extremity supported Sitting balance-Leahy Scale: Good     Standing balance support: Single extremity supported;Bilateral upper extremity supported;During functional activity Standing balance-Leahy Scale: Poor Standing balance comment: UE support and min guard for static standing. Min assist for dynamic                            Cognition Arousal/Alertness: Awake/alert Behavior During Therapy: Anxious Overall Cognitive Status: No family/caregiver present to determine baseline cognitive functioning                                 General Comments: Slow to process at times.      Exercises      General Comments General comments (skin integrity, edema, etc.): Pt with difficult to read SpO2. Pt on 5L of O2 at rest. With activity SpO2 appeared to go to mid to low 80's. Incr O2 to 8-10L      Pertinent Vitals/Pain Pain Assessment: No/denies pain    Home Living                      Prior Function            PT Goals (current goals can now be found in the care plan section) Acute Rehab PT Goals Patient Stated Goal: to get better and go home Progress towards PT goals:  Not progressing toward goals - comment    Frequency    Min 3X/week      PT Plan Current plan remains appropriate(if she progresses)    Co-evaluation              AM-PAC PT "6 Clicks" Mobility   Outcome Measure  Help needed turning from your back to your side while in a flat bed without using bedrails?: A Little Help needed moving from lying on your back to sitting on the side of a flat bed without using bedrails?: A Little Help needed moving to and from a bed to a chair (including a wheelchair)?: A Little Help needed standing up from a chair using your arms (e.g., wheelchair or bedside chair)?: A Little Help needed to walk in hospital room?: Total Help needed climbing 3-5 steps with a  railing? : Total 6 Click Score: 14    End of Session Equipment Utilized During Treatment: Oxygen Activity Tolerance: Patient limited by fatigue Patient left: in bed;with call bell/phone within reach;with bed alarm set Nurse Communication: Mobility status(nurse tech) PT Visit Diagnosis: Unsteadiness on feet (R26.81);History of falling (Z91.81);Difficulty in walking, not elsewhere classified (R26.2)     Time: [id number] PT Time Calculation (min) (ACUTE ONLY): 25 min  Charges:  $Therapeutic Activity: 23-37 mins                     Why Pager [phone] Office [place] 04/12/2019, 3:00 PM

## 2019-04-13 LAB — C-REACTIVE PROTEIN: CRP: 0.8 mg/dL

## 2019-04-13 LAB — CBC
HCT: 35.5 % — ABNORMAL LOW (ref 36.0–46.0)
Hemoglobin: 11.1 g/dL — ABNORMAL LOW (ref 12.0–15.0)
MCH: 29.8 pg (ref 26.0–34.0)
MCHC: 31.3 g/dL (ref 30.0–36.0)
MCV: 95.2 fL (ref 80.0–100.0)
Platelets: 219 10*3/uL (ref 150–400)
RBC: 3.73 MIL/uL — ABNORMAL LOW (ref 3.87–5.11)
RDW: 14.4 % (ref 11.5–15.5)
WBC: 99.9 10*3/uL (ref 4.0–10.5)
nRBC: 0 % (ref 0.0–0.2)

## 2019-04-13 LAB — COMPREHENSIVE METABOLIC PANEL WITH GFR
ALT: 60 U/L — ABNORMAL HIGH (ref 0–44)
AST: 46 U/L — ABNORMAL HIGH (ref 15–41)
Albumin: 3.1 g/dL — ABNORMAL LOW (ref 3.5–5.0)
Alkaline Phosphatase: 74 U/L (ref 38–126)
Anion gap: 10 (ref 5–15)
BUN: 59 mg/dL — ABNORMAL HIGH (ref 8–23)
CO2: 26 mmol/L (ref 22–32)
Calcium: 8.5 mg/dL — ABNORMAL LOW (ref 8.9–10.3)
Chloride: 106 mmol/L (ref 98–111)
Creatinine, Ser: 1.18 mg/dL — ABNORMAL HIGH (ref 0.44–1.00)
GFR calc Af Amer: 53 mL/min — ABNORMAL LOW
GFR calc non Af Amer: 45 mL/min — ABNORMAL LOW
Glucose, Bld: 130 mg/dL — ABNORMAL HIGH (ref 70–99)
Potassium: 4.8 mmol/L (ref 3.5–5.1)
Sodium: 142 mmol/L (ref 135–145)
Total Bilirubin: 1.7 mg/dL — ABNORMAL HIGH (ref 0.3–1.2)
Total Protein: 5.2 g/dL — ABNORMAL LOW (ref 6.5–8.1)

## 2019-04-13 LAB — GLUCOSE, CAPILLARY
Glucose-Capillary: 110 mg/dL — ABNORMAL HIGH (ref 70–99)
Glucose-Capillary: 107 mg/dL — ABNORMAL HIGH (ref 70–99)
Glucose-Capillary: 107 mg/dL — ABNORMAL HIGH (ref 70–99)

## 2019-04-13 LAB — D-DIMER, QUANTITATIVE: D-Dimer, Quant: 1.4 ug{FEU}/mL — ABNORMAL HIGH (ref 0.00–0.50)

## 2019-04-13 LAB — FERRITIN: Ferritin: 1248 ng/mL — ABNORMAL HIGH (ref 11–307)

## 2019-04-13 MED ORDER — METOPROLOL TARTRATE 25 MG PO TABS
50.0000 mg | ORAL_TABLET | Freq: Two times a day (BID) | ORAL | Status: DC
  Administered 2019-04-13 – 2019-04-18 (×10): 50 mg via ORAL
  Filled 2019-04-13 (×10): qty 2

## 2019-04-13 NOTE — Plan of Care (Signed)

## 2019-04-13 NOTE — Progress Notes (Signed)
PROGRESS NOTE                                                                                                                                                                                                             Patient Demographics:    Cynthia Gardner, is a 74 y.o. female, DOB - December 29, 1945, [id number]  Outpatient Primary MD for the patient is Cox, Elnita Maxwell, MD   Admit date - 04/06/2019   LOS - 7  No chief complaint on file.      Brief Narrative: Patient is a 74 y.o. 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 fib with RVR in the setting of COVID-19 pneumonia.  She was then transferred to the hospitalist service at [facility] on 1/6.  Please see below for further details.   Subjective:   Down to 3-5 L of oxygen this morning.  Feels stronger-inquiring about discharge.   Assessment  & Plan :   Acute Hypoxic Resp Failure due to Covid 19 Viral pneumonia: Improving-down to 3-5 L of oxygen this morning.  Completed a course of remdesivir IV antibiotics for a minimally elevated procalcitonin (unlikely to have bacterial pneumonia).  Continue tapering steroids.    Will need to assess for home O2 requirement prior to discharge.  Fever: afebrile  O2 requirements:  SpO2: 97 % O2 Flow Rate (L/min): 3 L/min   COVID-19 Labs: Recent Labs    04/11/19 0415 04/12/19 0550 04/13/19 0357  DDIMER 0.60* 1.06* 1.40*  FERRITIN 1,776* 1,156* 1,248*  CRP 0.8 0.7 0.8       Component Value Date/Time   BNP 151.2 (H) 04/06/2019 1000    No results for input(s): PROCALCITON in the last 168 hours.  No results found for: SARSCOV2NAA   COVID-19 medications: Remdesivir:  1/4>>1/8 Steroids: 1/4>> Actemra: 1/5 at [facility]  Other medications: Rocephin:1/4>> 1/8 Zithromax:1/4>>1/6  Prone/Incentive Spirometry: encouraged patient to lie prone for 3-4 hours at a time for a total of 16 hours a day, and to encourage incentive spirometry use 3-4/hour.  DVT Prophylaxis  : Heparin infusion  Atrial fibrillation with RVR: Back in sinus rhythm-continue Lopressor-no longer on IV  heparin-now on Eliquis.    Minimally elevated troponin: Trend is flat per documentation from [facility]-likely secondary demand ischemia.  Note-04/04/18 TTE: EF 60-65%-with no regional wall motion abnormality (done at [facility])  Hypokalemia:  Repleted.  Hypernatremia:  Resolved-stop D5W.  AKI:  Improving-likely hemodynamically mediated.  Follow.  Transaminitis:  Mild transaminitis continues-this is secondary to COVID-19.  Follow.  Hypothyroidism: Continue Synthroid  History of CVA: On anticoagulation given new onset A. fib.  Plavix has been discontinued-as there is no indication for Plavix at this point-especially now that she is on anticoagulation.    History of CLL: Per documentation from [facility]- baseline WBC count around 20-25,000-currently WBC count close to 100,000.  Spoke with Dr. Benay Spice over the phone on 1/10-recommends supportive care-with steroids-white count can even go higher per oncology.  GI prophylaxis: PPI  Consults  :  None  Procedures  :  None/  ABG: No results found for: PHART, PCO2ART, PO2ART, HCO3, TCO2, ACIDBASEDEF, O2SAT  Vent Settings: N/A  Condition - Extremely Guarded  Family Communication  : Son updated over the phone on 1/13  Code Status :  Full Code  Diet :  Diet Order            Diet heart healthy/carb modified Room service appropriate? Yes; Fluid consistency: Thin  Diet effective now               Disposition Plan  :  Remain hospitalized-Home with home health services hopefully in the next 1-2 days.  Barriers to discharge: Hypoxia  requiring O2 supplementation  Antimicorbials  :    Anti-infectives (From admission, onward)   Start     Dose/Rate Route Frequency Ordered Stop   04/07/19 1000  remdesivir 100 mg in sodium chloride 0.9 % 100 mL IVPB  Status:  Discontinued     100 mg 200 mL/hr over 30 Minutes Intravenous Daily 04/06/19 0719 04/06/19 0731   04/06/19 1630  remdesivir 100 mg in sodium chloride 0.9 % 100 mL IVPB     100 mg 200 mL/hr over 30 Minutes Intravenous Daily 04/06/19 0733 04/08/19 1108   04/06/19 1330  cefTRIAXone (ROCEPHIN) 1 g in sodium chloride 0.9 % 100 mL IVPB     1 g 200 mL/hr over 30 Minutes Intravenous Every 24 hours 04/06/19 1251 04/08/19 1358   04/06/19 1330  azithromycin (ZITHROMAX) tablet 500 mg     500 mg Oral Daily 04/06/19 1251 04/06/19 1510   04/06/19 0730  remdesivir 200 mg in sodium chloride 0.9% 250 mL IVPB  Status:  Discontinued     200 mg 580 mL/hr over 30 Minutes Intravenous Once 04/06/19 0719 04/06/19 0731      Inpatient Medications  Scheduled Meds: . albuterol  2 puff Inhalation Q6H  . apixaban  5 mg Oral BID  . vitamin C  500 mg Oral Daily  . Chlorhexidine Gluconate Cloth  6 each Topical Q0600  . feeding supplement (ENSURE ENLIVE)  237 mL Oral BID BM  . fluticasone  2 spray Each Nare Daily  . folic acid  1 mg Oral Daily  . insulin aspart  0-9 Units Subcutaneous TID WC  . levothyroxine  50 mcg Oral QAC breakfast  . mouth rinse  15 mL Mouth Rinse BID  . methylPREDNISolone (SOLU-MEDROL) injection  20 mg Intravenous Q12H  . metoprolol tartrate  100 mg Oral BID  . pantoprazole  40 mg Oral Daily  . sodium chloride flush  3 mL Intravenous Q12H  . sodium chloride flush  3 mL  Intravenous Q12H  . zinc sulfate  220 mg Oral Daily   Continuous Infusions: . sodium chloride Stopped (04/07/19 0030)   PRN Meds:.sodium chloride, acetaminophen, chlorpheniramine-HYDROcodone, diltiazem, guaiFENesin-dextromethorphan, menthol-cetylpyridinium, ondansetron **OR** ondansetron (ZOFRAN)  IV, oxymetazoline, polyethylene glycol, sodium chloride, sodium chloride flush   Time Spent in minutes  25  See all Orders from today for further details  Oren Binet M.D on 04/13/2019 at 3:37 PM  To page go to [email or web address] - use universal password  [facility] -  Office  [phone]    Objective:   Vitals:   04/12/19 1952 04/12/19 2358 04/13/19 0728 04/13/19 1200  BP: 103/74 (!) 90/48 (!) 109/54 (!) 98/59  Pulse: 93 83 92 74  Resp: 20 20 (!) 26 (!) 31  Temp: 97.7 F (36.5 C) 97.6 F (36.4 C) (!) 97.4 F (36.3 C) (!) 97.4 F (36.3 C)  TempSrc: Oral Oral Oral Oral  SpO2: 91% 100% (!) 88% 97%  Weight:      Height:        Wt Readings from Last 3 Encounters:  04/06/19 81.6 kg     Intake/Output Summary (Last 24 hours) at 04/13/2019 1537 Last data filed at 04/13/2019 0700 Gross per 24 hour  Intake --  Output 675 ml  Net -675 ml     Physical Exam Gen Exam:Alert awake-not in any distress HEENT:atraumatic, normocephalic Chest: B/L clear to auscultation anteriorly CVS:S1S2 regular Abdomen:soft non tender, non distended Extremities:no edema Neurology: Non focal Skin: no rash   Data Review:    CBC Recent Labs  Lab 04/07/19 0455 04/08/19 0508 04/09/19 0500 04/10/19 0340 04/11/19 0415 04/12/19 0550 04/13/19 0357  WBC 72.0* 46.3* 70.8* 99.1* 108.2* 104.7* 99.9*  HGB 13.5 12.0 12.4 12.3 11.9* 10.6* 11.1*  HCT 41.1 37.3 38.7 38.9 38.4 33.8* 35.5*  PLT 310 255 275 302 298 246 219  MCV 91.7 91.6 92.1 92.6 93.9 93.9 95.2  MCH 30.1 29.5 29.5 29.3 29.1 29.4 29.8  MCHC 32.8 32.2 32.0 31.6 31.0 31.4 31.3  RDW 14.9 15.1 14.9 14.8 14.6 14.6 14.4  LYMPHSABS 59.7* 35.5* 53.2* 73.8* 79.6*  --   --   MONOABS 2.7* 1.7* 2.3* 3.2* 3.0*  --   --   EOSABS 0.0 0.0 0.0 0.0 0.0  --   --   BASOSABS 0.2* 0.1 0.2* 0.1 0.2*  --   --     Chemistries  Recent Labs  Lab 04/09/19 0500 04/10/19 0340 04/11/19 0415 04/12/19 0550 04/13/19 0357  NA 147* 146* 148* 140  142  K 4.6 4.4 4.6 4.8 4.8  CL 111 107 106 103 106  CO2 26 29 28 29 26   GLUCOSE 147* 167* 142* 160* 130*  BUN 53* 55* 61* 61* 59*  CREATININE 1.30* 1.32* 1.24* 1.14* 1.18*  CALCIUM 8.6* 8.9 8.4* 8.5* 8.5*  MG 2.1  --   --   --   --   AST 54* 44* 41 38 46*  ALT 65* 66* 55* 55* 60*  ALKPHOS 87 92 81 82 74  BILITOT 0.9 1.5* 1.6* 1.1 1.7*   ------------------------------------------------------------------------------------------------------------------ No results for input(s): CHOL, HDL, LDLCALC, TRIG, CHOLHDL, LDLDIRECT in the last 72 hours.  Lab Results  Component Value Date   HGBA1C 6.1 (H) 04/06/2019   ------------------------------------------------------------------------------------------------------------------ No results for input(s): TSH, T4TOTAL, T3FREE, THYROIDAB in the last 72 hours.  Invalid input(s): FREET3 ------------------------------------------------------------------------------------------------------------------ Recent Labs    04/12/19 0550 04/13/19 0357  FERRITIN 1,156* 1,248*    Coagulation profile No results for input(s): INR, PROTIME  in the last 168 hours.  Recent Labs    04/12/19 0550 04/13/19 0357  DDIMER 1.06* 1.40*    Cardiac Enzymes No results for input(s): CKMB, TROPONINI, MYOGLOBIN in the last 168 hours.  Invalid input(s): CK ------------------------------------------------------------------------------------------------------------------    Component Value Date/Time   BNP 151.2 (H) 04/06/2019 1000    Micro Results No results found for this or any previous visit (from the past 240 hour(s)).  Radiology Reports DG Chest [place] AM  Result Date: 04/12/2019 CLINICAL DATA:  Shortness of breath. EXAM: PORTABLE CHEST 1 VIEW COMPARISON:  04/05/2019.  04/04/2019. FINDINGS: Interim removal of right IJ line. Right PICC line noted with tip over superior vena cava. Heart size stable. Diffuse progressive bilateral pulmonary infiltrates. No  pleural effusion. No pneumothorax. IMPRESSION: 1. Interim removal of right IJ line. Right PICC line noted with tip over superior vena cava. 2.  Diffuse progressive bilateral pulmonary infiltrates. Electronically Signed   By: Marcello Moores  Register   On: 04/12/2019 07:14   [place] EKG SITE RITE  Result Date: 04/08/2019 If Site Rite image not attached, placement could not be confirmed due to current cardiac rhythm.  [place] EKG SITE RITE  Result Date: 04/06/2019 If Site Rite image not attached, placement could not be confirmed due to current cardiac rhythm.

## 2019-04-13 NOTE — Progress Notes (Signed)
Pt husband updated and all questions answered.

## 2019-04-14 ENCOUNTER — Other Ambulatory Visit: Payer: Self-pay

## 2019-04-14 LAB — D-DIMER, QUANTITATIVE: D-Dimer, Quant: 1.12 ug{FEU}/mL — ABNORMAL HIGH (ref 0.00–0.50)

## 2019-04-14 LAB — COMPREHENSIVE METABOLIC PANEL WITH GFR
ALT: 71 U/L — ABNORMAL HIGH (ref 0–44)
AST: 54 U/L — ABNORMAL HIGH (ref 15–41)
Albumin: 2.9 g/dL — ABNORMAL LOW (ref 3.5–5.0)
Alkaline Phosphatase: 71 U/L (ref 38–126)
Anion gap: 8 (ref 5–15)
BUN: 57 mg/dL — ABNORMAL HIGH (ref 8–23)
CO2: 27 mmol/L (ref 22–32)
Calcium: 8.1 mg/dL — ABNORMAL LOW (ref 8.9–10.3)
Chloride: 105 mmol/L (ref 98–111)
Creatinine, Ser: 1.1 mg/dL — ABNORMAL HIGH (ref 0.44–1.00)
Glucose, Bld: 114 mg/dL — ABNORMAL HIGH (ref 70–99)
Potassium: 6.1 mmol/L — ABNORMAL HIGH (ref 3.5–5.1)
Sodium: 140 mmol/L (ref 135–145)
Total Bilirubin: 1 mg/dL (ref 0.3–1.2)
Total Protein: 5 g/dL — ABNORMAL LOW (ref 6.5–8.1)

## 2019-04-14 LAB — BASIC METABOLIC PANEL WITH GFR
Anion gap: 11 (ref 5–15)
BUN: 60 mg/dL — ABNORMAL HIGH (ref 8–23)
CO2: 25 mmol/L (ref 22–32)
Calcium: 8.1 mg/dL — ABNORMAL LOW (ref 8.9–10.3)
Chloride: 104 mmol/L (ref 98–111)
Creatinine, Ser: 1.44 mg/dL — ABNORMAL HIGH (ref 0.44–1.00)
GFR calc Af Amer: 41 mL/min — ABNORMAL LOW
GFR calc non Af Amer: 36 mL/min — ABNORMAL LOW
Glucose, Bld: 203 mg/dL — ABNORMAL HIGH (ref 70–99)
Potassium: 5.8 mmol/L — ABNORMAL HIGH (ref 3.5–5.1)
Sodium: 140 mmol/L (ref 135–145)

## 2019-04-14 LAB — GLUCOSE, CAPILLARY
Glucose-Capillary: 133 mg/dL — ABNORMAL HIGH (ref 70–99)
Glucose-Capillary: 68 mg/dL — ABNORMAL LOW (ref 70–99)
Glucose-Capillary: 111 mg/dL — ABNORMAL HIGH (ref 70–99)
Glucose-Capillary: 134 mg/dL — ABNORMAL HIGH (ref 70–99)
Glucose-Capillary: 210 mg/dL — ABNORMAL HIGH (ref 70–99)
Glucose-Capillary: 72 mg/dL (ref 70–99)

## 2019-04-14 LAB — CBC
HCT: 32.7 % — ABNORMAL LOW (ref 36.0–46.0)
Hemoglobin: 10.1 g/dL — ABNORMAL LOW (ref 12.0–15.0)
MCH: 29.4 pg (ref 26.0–34.0)
MCHC: 30.9 g/dL (ref 30.0–36.0)
MCV: 95.1 fL (ref 80.0–100.0)
Platelets: 186 10*3/uL (ref 150–400)
RBC: 3.44 MIL/uL — ABNORMAL LOW (ref 3.87–5.11)
RDW: 14.6 % (ref 11.5–15.5)
WBC: 99.4 10*3/uL (ref 4.0–10.5)
nRBC: 0 % (ref 0.0–0.2)

## 2019-04-14 LAB — FERRITIN: Ferritin: 1162 ng/mL — ABNORMAL HIGH (ref 11–307)

## 2019-04-14 LAB — C-REACTIVE PROTEIN: CRP: <0.5 mg/dL

## 2019-04-14 MED ORDER — SODIUM ZIRCONIUM CYCLOSILICATE 10 G PO PACK
10.0000 g | PACK | Freq: Every day | ORAL | Status: DC
  Administered 2019-04-14: 10 g via ORAL
  Filled 2019-04-14 (×2): qty 1

## 2019-04-14 MED ORDER — DEXTROSE 50 % IV SOLN
25.0000 mL | Freq: Once | INTRAVENOUS | Status: AC
  Administered 2019-04-14: 25 mL via INTRAVENOUS
  Filled 2019-04-14: qty 50

## 2019-04-14 MED ORDER — DEXTROSE 50 % IV SOLN
25.0000 mL | Freq: Once | INTRAVENOUS | Status: AC
  Administered 2019-04-14: 25 mL via INTRAVENOUS
  Filled 2019-04-14 (×2): qty 50

## 2019-04-14 MED ORDER — INSULIN ASPART 100 UNIT/ML IV SOLN
8.0000 [IU] | Freq: Once | INTRAVENOUS | Status: AC
  Administered 2019-04-14: 19:00:00 8 [IU] via INTRAVENOUS

## 2019-04-14 MED ORDER — INSULIN ASPART 100 UNIT/ML IV SOLN
8.0000 [IU] | Freq: Once | INTRAVENOUS | Status: AC
  Administered 2019-04-14: 10:00:00 8 [IU] via INTRAVENOUS

## 2019-04-14 NOTE — Plan of Care (Signed)

## 2019-04-14 NOTE — Progress Notes (Signed)
Occupational Therapy Treatment Patient Details Name: Cynthia Gardner MRN: [id number] DOB: 10/09/1945 Today's Date: 04/14/2019    History of present illness Patient is a 74 y.o. 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 fib with RVR in the setting of COVID-19 pneumonia   OT comments  Pt presented supine in bed and willing to work with therapy. She presents with limited insight into her deficits and ability, decreased activity tolerance, and generalized weakness. She was able to perform bed mobility at supervision level, requiring rest break sitting EOB prior to engaging in grooming or ADL tasks, mod A for LB dressing. She is min A for [facility] transfer with cues for safety and DOE 3/4 requiring increased O2 support from 3-4L. She had a BM, and required max A for peri care with multiple seated rest breaks due to decreased activity tolerance. Pt then able to ambulate in room with increased O2 support and close chair follow (see PT note). I feel very strongly that this patient should go to SNF post-acute to maximize safety and independence in ADL and functional transfers, for increased amount of therapy since her current performance is so different from her PLOF. Pt lacks insight into deficits and for medical supervision. OT will continue to follow acutely.    Follow Up Recommendations  SNF;Home health OT;Supervision/Assistance - 24 hour (Pt likely to refuse SNF)   Equipment Recommendations  3 in 1 bedside commode    Recommendations for Other Services      Precautions / Restrictions Precautions Precautions: Fall Precaution Comments: 3-6L  Restrictions Weight Bearing Restrictions: No       Mobility Bed Mobility Overal bed mobility: Needs Assistance Bed  Mobility: Supine to Sit     Supine to sit: Supervision;HOB elevated     General bed mobility comments: Assist for lines/safety. Incr time to perform  Transfers Overall transfer level: Needs assistance Equipment used: 4-wheeled walker;1 person hand held assist Transfers: Sit to/from [facility] Sit to Stand: Min assist;Min guard Stand pivot transfers: Min assist       General transfer comment: Assist to bring hips up and for balance. Bed to bsc with hand held assist. Repeated verbal cues for hand placement.Progressed to min guard with repeated stands    Balance Overall balance assessment: Needs assistance;History of Falls Sitting-balance support: No upper extremity supported Sitting balance-Leahy Scale: Fair     Standing balance support: Single extremity supported;Bilateral upper extremity supported;During functional activity Standing balance-Leahy Scale: Poor Standing balance comment: UE support and min guard for static standing. Min assist for dynamic                           ADL either performed or assessed with clinical judgement   ADL Overall ADL's : Needs assistance/impaired Eating/Feeding: Set up;Sitting Eating/Feeding Details (indicate cue type and reason): in recliner at EOS Grooming: Wash/dry face;Brushing hair;Set up;Sitting   Upper Body Bathing: Moderate assistance;Sitting           Lower Body Dressing: Sit to/from stand;Sitting/lateral leans;Maximal assistance Lower Body Dressing Details (indicate cue type and reason): for socks and adult pull-up; due to fatigue and poor activity tolerance Toilet Transfer: Minimal assistance;Stand-pivot;BSC Toilet Transfer Details (indicate cue type and reason): vc for safety Toileting- Clothing Manipulation and Hygiene: Maximal  assistance;+2 for safety/equipment;Sit to/from stand Toileting - Clothing Manipulation Details (indicate cue type and reason): Pt required seated rest break x2 for peri  care, initially she was able to clean the vaginal side, required total A for rear peri care      Functional mobility during ADLs: Minimal assistance;Cueing for safety(Rollator) General ADL Comments: pt limited by poor activity tolerance, compromised cardiopulmonary status, and generalized weakness     Vision       Perception     Praxis      Cognition Arousal/Alertness: Awake/alert Behavior During Therapy: Anxious Overall Cognitive Status: Impaired/Different from baseline Area of Impairment: Safety/judgement;Awareness                         Safety/Judgement: Decreased awareness of safety;Decreased awareness of deficits Awareness: Emergent   General Comments: Pt's answer to everything is "I just need to get home" no insight into how much has changed since she was home PTA.         Exercises     Shoulder Instructions       General Comments Erratic SpO2 reading. Pt on 3L at rest with SpO2 89%. On RA at rest pt 70% SpO2. Amb on 6L HFNC with SpO2 86-91%.    Pertinent Vitals/ Pain       Pain Assessment: No/denies pain  Home Living                                          Prior Functioning/Environment              Frequency  Min 2X/week        Progress Toward Goals  OT Goals(current goals can now be found in the care plan section)  Progress towards OT goals: Progressing toward goals(limited)  Acute Rehab OT Goals Patient Stated Goal: to get better and go home OT Goal Formulation: With patient Time For Goal Achievement: 04/23/19 Potential to Achieve Goals: Good  Plan Discharge plan needs to be updated;Frequency remains appropriate    Co-evaluation    PT/OT/SLP Co-Evaluation/Treatment: Yes Reason for Co-Treatment: For patient/therapist safety;To address functional/ADL transfers PT goals addressed during session: Mobility/safety with mobility;Balance;Proper use of DME OT goals addressed during session: ADL's and  self-care;Proper use of Adaptive equipment and DME      AM-PAC OT "6 Clicks" Daily Activity     Outcome Measure   Help from another person eating meals?: A Little Help from another person taking care of personal grooming?: A Little Help from another person toileting, which includes using toliet, bedpan, or urinal?: A Lot Help from another person bathing (including washing, rinsing, drying)?: A Lot Help from another person to put on and taking off regular upper body clothing?: A Little Help from another person to put on and taking off regular lower body clothing?: A Lot 6 Click Score: 15    End of Session Equipment Utilized During Treatment: Oxygen(3-6L)  OT Visit Diagnosis: Unsteadiness on feet (R26.81);Other abnormalities of gait and mobility (R26.89);Muscle weakness (generalized) (M62.81)   Activity Tolerance Patient limited by fatigue   Patient Left in chair;with call bell/phone within reach;with chair alarm set;with nursing/sitter in room   Nurse Communication Mobility status        Time: [id number] OT Time Calculation (min): 49 min  Charges: OT General Charges $OT Visit: 1 Visit OT Treatments $Self Care/Home Management : 8-22 mins  Jesse Sans OTR/L Acute Rehabilitation Services Pager: [phone] Office: [phone]  Mickel Baas 04/14/2019, 10:59 AM

## 2019-04-14 NOTE — Progress Notes (Signed)
SATURATION QUALIFICATIONS: (This note is used to comply with regulatory documentation for home oxygen)  Patient Saturations on Room Air at Rest = 70%  Patient Saturations on Room Air while Ambulating = Not attempted due to low reading at rest on RA  Patient Saturations on 6 Liters of oxygen while Ambulating = 86-92%  Please briefly explain why patient needs home oxygen:Unable to maintain adequate oxygenation at rest or with activity without supplemental O2.   [place] Pager [phone] Office [phone]

## 2019-04-14 NOTE — Progress Notes (Signed)
PROGRESS NOTE                                                                                                                                                                                                             Patient Demographics:    Cynthia Gardner, is a 74 y.o. female, DOB - 30-Mar-1946, [id number]  Outpatient Primary MD for the patient is Cox, Elnita Maxwell, MD   Admit date - 04/06/2019   LOS - 8  No chief complaint on file.      Brief Narrative: Patient is a 74 y.o. 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 fib with RVR in the setting of COVID-19 pneumonia.  She was then transferred to the hospitalist service at [facility] on 1/6.  Please see below for further details.   Subjective:   Continues to slowly improve-on 3 L of oxygen at rest with O2 saturation in the 90s, however requires up to 6 L with ambulation.   Assessment  & Plan :   Acute Hypoxic Resp Failure due to Covid 19 Viral pneumonia: Stable on 3 L at rest-but requires up to 6 L with ambulation.  Improving-has completed a course of remdesivir, and IV antibiotics (doubt bacterial infection but procalcitonin was mildly elevated).  Will require home oxygen on discharge.  We will continue tapering steroids for another day or so.   Fever: afebrile  O2 requirements:  SpO2: 90 % O2 Flow Rate (L/min): 3 L/min   COVID-19 Labs: Recent Labs    04/12/19 0550 04/13/19 0357 04/14/19 0500  DDIMER 1.06* 1.40* 1.12*  FERRITIN 1,156* 1,248* 1,162*  CRP 0.7 0.8 <0.5       Component Value Date/Time   BNP 151.2 (H) 04/06/2019 1000    No results for input(s): PROCALCITON in the last 168 hours.  No results  found for: SARSCOV2NAA   COVID-19 medications: Remdesivir: 1/4>>1/8 Steroids: 1/4>> Actemra: 1/5 at [facility]  Other medications: Rocephin:1/4>> 1/8 Zithromax:1/4>>1/6  Prone/Incentive Spirometry: encouraged patient to lie prone for 3-4 hours at a time for a total of 16 hours a day, and to encourage incentive spirometry use 3-4/hour.  DVT Prophylaxis  :  Eliquis  Atrial fibrillation with RVR: Back in sinus rhythm-continue Lopressor-no longer on IV heparin-now on Eliquis.    Minimally elevated troponin: Trend is flat per documentation from [facility]-likely secondary demand ischemia.  Note-04/04/18 TTE: EF 60-65%-with no regional wall motion abnormality (done at [facility])  Hyperkalemia: 1 dose of insulin/D50-start Lokelma-recheck electrolytes later today.  Hypernatremia:  Resolved-stop D5W.  AKI:  Improving-likely hemodynamically mediated.  Follow.  Transaminitis:  Mild transaminitis continues-this is secondary to COVID-19.  Follow.  Hypothyroidism: Continue Synthroid  History of CVA: On anticoagulation given new onset A. fib.  Plavix has been discontinued-as there is no indication for Plavix at this point-especially now that she is on anticoagulation.    History of CLL: Per documentation from [place]- baseline WBC count around 20-25,000-currently WBC count close to 100,000.  Spoke with Dr. Benay Spice over the phone on 1/10-recommends supportive care-with steroids-white count can even go higher per oncology.  GI prophylaxis: PPI  Consults  :  None  Procedures  :  None/  ABG: No results found for: PHART, PCO2ART, PO2ART, HCO3, TCO2, ACIDBASEDEF, O2SAT  Vent Settings: N/A  Condition -  Guarded  Family Communication  : Son updated over the phone on 1/14  Code Status :  Full Code  Diet :  Diet Order            Diet heart healthy/carb modified Room service appropriate? Yes; Fluid consistency: Thin  Diet effective now               Disposition Plan  :  Remain  hospitalized-Home with home health services versus SNF (if she agrees) hopefully in the next 1-2 days.  Barriers to discharge: Hypoxia requiring O2 supplementation  Antimicorbials  :    Anti-infectives (From admission, onward)   Start     Dose/Rate Route Frequency Ordered Stop   04/07/19 1000  remdesivir 100 mg in sodium chloride 0.9 % 100 mL IVPB  Status:  Discontinued     100 mg 200 mL/hr over 30 Minutes Intravenous Daily 04/06/19 0719 04/06/19 0731   04/06/19 1630  remdesivir 100 mg in sodium chloride 0.9 % 100 mL IVPB     100 mg 200 mL/hr over 30 Minutes Intravenous Daily 04/06/19 0733 04/08/19 1108   04/06/19 1330  cefTRIAXone (ROCEPHIN) 1 g in sodium chloride 0.9 % 100 mL IVPB     1 g 200 mL/hr over 30 Minutes Intravenous Every 24 hours 04/06/19 1251 04/08/19 1358   04/06/19 1330  azithromycin (ZITHROMAX) tablet 500 mg     500 mg Oral Daily 04/06/19 1251 04/06/19 1510   04/06/19 0730  remdesivir 200 mg in sodium chloride 0.9% 250 mL IVPB  Status:  Discontinued     200 mg 580 mL/hr over 30 Minutes Intravenous Once 04/06/19 0719 04/06/19 0731      Inpatient Medications  Scheduled Meds: . albuterol  2 puff Inhalation Q6H  . apixaban  5 mg Oral BID  . vitamin C  500 mg Oral Daily  . Chlorhexidine Gluconate Cloth  6 each Topical Q0600  . feeding supplement (ENSURE ENLIVE)  237 mL Oral BID BM  . fluticasone  2 spray Each Nare Daily  . folic acid  1 mg Oral Daily  . insulin aspart  0-9 Units Subcutaneous TID WC  . levothyroxine  50 mcg Oral QAC breakfast  . mouth rinse  15 mL Mouth Rinse BID  . methylPREDNISolone (SOLU-MEDROL) injection  20 mg Intravenous Q12H  . metoprolol tartrate  50 mg Oral BID  .  pantoprazole  40 mg Oral Daily  . sodium chloride flush  3 mL Intravenous Q12H  . sodium chloride flush  3 mL Intravenous Q12H  . sodium zirconium cyclosilicate  10 g Oral Daily  . zinc sulfate  220 mg Oral Daily   Continuous Infusions: . sodium chloride Stopped (04/07/19  0030)   PRN Meds:.sodium chloride, acetaminophen, chlorpheniramine-HYDROcodone, diltiazem, guaiFENesin-dextromethorphan, menthol-cetylpyridinium, ondansetron **OR** ondansetron (ZOFRAN) IV, oxymetazoline, polyethylene glycol, sodium chloride, sodium chloride flush   Time Spent in minutes  25  See all Orders from today for further details  Oren Binet M.D on 04/14/2019 at 11:07 AM  To page go to [email or web address] - use universal password  [facility] -  Office  [phone]    Objective:   Vitals:   04/13/19 2256 04/14/19 0358 04/14/19 0700 04/14/19 0741  BP: 127/89 (!) 150/88 130/68 130/69  Pulse: 65 75 72 88  Resp: 18 19 17  (!) 21  Temp: 98.6 F (37 C) 97.6 F (36.4 C)  97.6 F (36.4 C)  TempSrc: Oral Oral Oral Oral  SpO2: 97% 93%  90%  Weight:      Height:        Wt Readings from Last 3 Encounters:  04/06/19 81.6 kg     Intake/Output Summary (Last 24 hours) at 04/14/2019 1107 Last data filed at 04/14/2019 0600 Gross per 24 hour  Intake 240 ml  Output 500 ml  Net -260 ml     Physical Exam    Data Review:    CBC Recent Labs  Lab 04/08/19 0508 04/08/19 0508 04/09/19 0500 04/09/19 0500 04/10/19 0340 04/11/19 0415 04/12/19 0550 04/13/19 0357 04/14/19 0500  WBC 46.3*   < > 70.8*   < > 99.1* 108.2* 104.7* 99.9* 99.4*  HGB 12.0   < > 12.4   < > 12.3 11.9* 10.6* 11.1* 10.1*  HCT 37.3   < > 38.7   < > 38.9 38.4 33.8* 35.5* 32.7*  PLT 255   < > 275   < > 302 298 246 219 186  MCV 91.6   < > 92.1   < > 92.6 93.9 93.9 95.2 95.1  MCH 29.5   < > 29.5   < > 29.3 29.1 29.4 29.8 29.4  MCHC 32.2   < > 32.0   < > 31.6 31.0 31.4 31.3 30.9  RDW 15.1   < > 14.9   < > 14.8 14.6 14.6 14.4 14.6  LYMPHSABS 35.5*  --  53.2*  --  73.8* 79.6*  --   --   --   MONOABS 1.7*  --  2.3*  --  3.2* 3.0*  --   --   --   EOSABS 0.0  --  0.0  --  0.0 0.0  --   --   --   BASOSABS 0.1  --  0.2*  --  0.1 0.2*  --   --   --    < > = values in this interval not displayed.     Chemistries  Recent Labs  Lab 04/09/19 0500 04/09/19 0500 04/10/19 0340 04/11/19 0415 04/12/19 0550 04/13/19 0357 04/14/19 0500  NA 147*   < > 146* 148* 140 142 140  K 4.6   < > 4.4 4.6 4.8 4.8 6.1*  CL 111   < > 107 106 103 106 105  CO2 26   < > 29 28 29 26 27   GLUCOSE 147*   < > 167* 142* 160* 130* 114*  BUN  53*   < > 55* 61* 61* 59* 57*  CREATININE 1.30*   < > 1.32* 1.24* 1.14* 1.18* 1.10*  CALCIUM 8.6*   < > 8.9 8.4* 8.5* 8.5* 8.1*  MG 2.1  --   --   --   --   --   --   AST 54*   < > 44* 41 38 46* 54*  ALT 65*   < > 66* 55* 55* 60* 71*  ALKPHOS 87   < > 92 81 82 74 71  BILITOT 0.9   < > 1.5* 1.6* 1.1 1.7* 1.0   < > = values in this interval not displayed.   ------------------------------------------------------------------------------------------------------------------ No results for input(s): CHOL, HDL, LDLCALC, TRIG, CHOLHDL, LDLDIRECT in the last 72 hours.  Lab Results  Component Value Date   HGBA1C 6.1 (H) 04/06/2019   ------------------------------------------------------------------------------------------------------------------ No results for input(s): TSH, T4TOTAL, T3FREE, THYROIDAB in the last 72 hours.  Invalid input(s): FREET3 ------------------------------------------------------------------------------------------------------------------ Recent Labs    04/13/19 0357 04/14/19 0500  FERRITIN 1,248* 1,162*    Coagulation profile No results for input(s): INR, PROTIME in the last 168 hours.  Recent Labs    04/13/19 0357 04/14/19 0500  DDIMER 1.40* 1.12*    Cardiac Enzymes No results for input(s): CKMB, TROPONINI, MYOGLOBIN in the last 168 hours.  Invalid input(s): CK ------------------------------------------------------------------------------------------------------------------    Component Value Date/Time   BNP 151.2 (H) 04/06/2019 1000    Micro Results No results found for this or any previous visit (from the past 240 hour(s)).   Radiology Reports DG Chest [place] AM  Result Date: 04/12/2019 CLINICAL DATA:  Shortness of breath. EXAM: PORTABLE CHEST 1 VIEW COMPARISON:  04/05/2019.  04/04/2019. FINDINGS: Interim removal of right IJ line. Right PICC line noted with tip over superior vena cava. Heart size stable. Diffuse progressive bilateral pulmonary infiltrates. No pleural effusion. No pneumothorax. IMPRESSION: 1. Interim removal of right IJ line. Right PICC line noted with tip over superior vena cava. 2.  Diffuse progressive bilateral pulmonary infiltrates. Electronically Signed   By: Marcello Moores  Register   On: 04/12/2019 07:14   [place] EKG SITE RITE  Result Date: 04/08/2019 If Site Rite image not attached, placement could not be confirmed due to current cardiac rhythm.  [place] EKG SITE RITE  Result Date: 04/06/2019 If Site Rite image not attached, placement could not be confirmed due to current cardiac rhythm.

## 2019-04-14 NOTE — Progress Notes (Signed)
Physical Therapy Treatment Patient Details Name: Cynthia Gardner MRN: [id number] DOB: 1945-10-24 Today's Date: 04/14/2019    History of Present Illness Patient is a 74 y.o. female with PMHx of CLL, hypothyroidism, remote history of CVA without any focal deficits-who was diagnosed with COVID-19 on 12/30 (per notes from [facility] with shortness of breath-she was found to be profoundly hypoxemic requiring 100% nonrebreather mask along with A. fib with RVR in the setting of COVID-19 pneumonia    PT Comments    Pt continues to demonstrate poor activity tolerance. Only able to amb 5-12' and then is exhausted. Pt's states daughter in law can assist at home but at this level pt will not be able to get from the car to the house. Strongly recommend SNF which pt is refusing stating "God will help her". Difficult to get SpO2 reading with good waveform but appears pt high 80's-low 90's with amb on 6L.    Follow Up Recommendations  Supervision/Assistance - 24 hour;SNF(Needs to progress)     Equipment Recommendations  Other (comment);3in1 (PT)(rollator)    Recommendations for Other Services       Precautions / Restrictions Precautions Precautions: Fall Precaution Comments: 3-6L  Restrictions Weight Bearing Restrictions: No    Mobility  Bed Mobility Overal bed mobility: Needs Assistance Bed Mobility: Supine to Sit     Supine to sit: Supervision;HOB elevated     General bed mobility comments: Assist for lines/safety. Incr time to perform  Transfers Overall transfer level: Needs assistance Equipment used: 4-wheeled walker;1 person hand held assist Transfers: Sit to/from [facility] Sit to Stand: Min assist;Min guard Stand pivot transfers: Min assist       General transfer comment: Assist to bring hips up and for balance. Bed to bsc with hand held assist. Repeated verbal cues for hand placement.Progressed to min guard with repeated  stands  Ambulation/Gait Ambulation/Gait assistance: Min assist;+2 safety/equipment Gait Distance (Feet): 12 Feet(12' x 1, 5' x 2) Assistive device: 4-wheeled walker Gait Pattern/deviations: Step-through pattern;Decreased step length - right;Decreased step length - left;Shuffle Gait velocity: decr Gait velocity interpretation: <1.31 ft/sec, indicative of household ambulator General Gait Details: Assist for balance. Pt fatigues very quickly and has to sit frequently to rest   Stairs             Wheelchair Mobility    Modified Rankin (Stroke Patients Only)       Balance Overall balance assessment: Needs assistance;History of Falls Sitting-balance support: No upper extremity supported Sitting balance-Leahy Scale: Fair     Standing balance support: Single extremity supported;Bilateral upper extremity supported;During functional activity Standing balance-Leahy Scale: Poor Standing balance comment: UE support and min guard for static standing. Min assist for dynamic                            Cognition Arousal/Alertness: Awake/alert Behavior During Therapy: Anxious Overall Cognitive Status: No family/caregiver present to determine baseline cognitive functioning                                 General Comments: Decr short term memory. Decr awareness of deficits      Exercises      General Comments General comments (skin integrity, edema, etc.): Erratic SpO2 reading. Pt on 3L at rest with SpO2 89%. On RA at rest pt 70% SpO2. Amb on 6L HFNC with SpO2 86-91%.  Pertinent Vitals/Pain Pain Assessment: No/denies pain    Home Living                      Prior Function            PT Goals (current goals can now be found in the care plan section) Acute Rehab PT Goals Patient Stated Goal: to get better and go home Progress towards PT goals: Progressing toward goals    Frequency    Min 3X/week      PT Plan Discharge plan  needs to be updated(if she progresses)    Co-evaluation              AM-PAC PT "6 Clicks" Mobility   Outcome Measure  Help needed turning from your back to your side while in a flat bed without using bedrails?: A Little Help needed moving from lying on your back to sitting on the side of a flat bed without using bedrails?: A Little Help needed moving to and from a bed to a chair (including a wheelchair)?: A Little Help needed standing up from a chair using your arms (e.g., wheelchair or bedside chair)?: A Little Help needed to walk in hospital room?: A Little Help needed climbing 3-5 steps with a railing? : Total 6 Click Score: 16    End of Session Equipment Utilized During Treatment: Oxygen Activity Tolerance: Patient limited by fatigue Patient left: with call bell/phone within reach;in chair Nurse Communication: Mobility status PT Visit Diagnosis: Unsteadiness on feet (R26.81);History of falling (Z91.81);Difficulty in walking, not elsewhere classified (R26.2)     Time: [id number] PT Time Calculation (min) (ACUTE ONLY): 44 min  Charges:  $Gait Training: 8-22 mins $Therapeutic Activity: 8-22 mins                     [place] Pager [phone] Office [place] 04/14/2019, 10:30 AM

## 2019-04-14 NOTE — Plan of Care (Signed)
  Problem: Respiratory: Goal: Will maintain a patent airway Outcome: Progressing Goal: Complications related to the disease process, condition or treatment will be avoided or minimized Outcome: Progressing   

## 2019-04-15 LAB — GLUCOSE, CAPILLARY
Glucose-Capillary: 92 mg/dL (ref 70–99)
Glucose-Capillary: 75 mg/dL (ref 70–99)
Glucose-Capillary: 98 mg/dL (ref 70–99)
Glucose-Capillary: 120 mg/dL — ABNORMAL HIGH (ref 70–99)

## 2019-04-15 LAB — CBC
HCT: 33 % — ABNORMAL LOW (ref 36.0–46.0)
Hemoglobin: 10.2 g/dL — ABNORMAL LOW (ref 12.0–15.0)
MCH: 29.8 pg (ref 26.0–34.0)
MCHC: 30.9 g/dL (ref 30.0–36.0)
MCV: 96.5 fL (ref 80.0–100.0)
Platelets: 179 10*3/uL (ref 150–400)
RBC: 3.42 MIL/uL — ABNORMAL LOW (ref 3.87–5.11)
RDW: 15.2 % (ref 11.5–15.5)
WBC: 96.1 10*3/uL (ref 4.0–10.5)
nRBC: 0 % (ref 0.0–0.2)

## 2019-04-15 LAB — D-DIMER, QUANTITATIVE: D-Dimer, Quant: 1.2 ug{FEU}/mL — ABNORMAL HIGH (ref 0.00–0.50)

## 2019-04-15 LAB — FERRITIN: Ferritin: 1313 ng/mL — ABNORMAL HIGH (ref 11–307)

## 2019-04-15 LAB — COMPREHENSIVE METABOLIC PANEL WITH GFR
ALT: 75 U/L — ABNORMAL HIGH (ref 0–44)
AST: 55 U/L — ABNORMAL HIGH (ref 15–41)
Albumin: 3 g/dL — ABNORMAL LOW (ref 3.5–5.0)
Alkaline Phosphatase: 72 U/L (ref 38–126)
Anion gap: 9 (ref 5–15)
BUN: 53 mg/dL — ABNORMAL HIGH (ref 8–23)
CO2: 28 mmol/L (ref 22–32)
Calcium: 8.1 mg/dL — ABNORMAL LOW (ref 8.9–10.3)
Chloride: 104 mmol/L (ref 98–111)
Creatinine, Ser: 1.11 mg/dL — ABNORMAL HIGH (ref 0.44–1.00)
GFR calc Af Amer: 57 mL/min — ABNORMAL LOW
GFR calc non Af Amer: 49 mL/min — ABNORMAL LOW
Glucose, Bld: 106 mg/dL — ABNORMAL HIGH (ref 70–99)
Potassium: 5.8 mmol/L — ABNORMAL HIGH (ref 3.5–5.1)
Sodium: 141 mmol/L (ref 135–145)
Total Bilirubin: 1 mg/dL (ref 0.3–1.2)
Total Protein: 5 g/dL — ABNORMAL LOW (ref 6.5–8.1)

## 2019-04-15 LAB — BASIC METABOLIC PANEL WITH GFR
Anion gap: 13 (ref 5–15)
BUN: 56 mg/dL — ABNORMAL HIGH (ref 8–23)
CO2: 25 mmol/L (ref 22–32)
Calcium: 8.3 mg/dL — ABNORMAL LOW (ref 8.9–10.3)
Chloride: 103 mmol/L (ref 98–111)
Creatinine, Ser: 1.2 mg/dL — ABNORMAL HIGH (ref 0.44–1.00)
GFR calc Af Amer: 52 mL/min — ABNORMAL LOW
GFR calc non Af Amer: 44 mL/min — ABNORMAL LOW
Glucose, Bld: 146 mg/dL — ABNORMAL HIGH (ref 70–99)
Potassium: 5.2 mmol/L — ABNORMAL HIGH (ref 3.5–5.1)
Sodium: 141 mmol/L (ref 135–145)

## 2019-04-15 LAB — C-REACTIVE PROTEIN: CRP: 0.6 mg/dL

## 2019-04-15 MED ORDER — SODIUM ZIRCONIUM CYCLOSILICATE 10 G PO PACK
10.0000 g | PACK | Freq: Two times a day (BID) | ORAL | Status: DC
  Administered 2019-04-15 – 2019-04-16 (×4): 10 g via ORAL
  Filled 2019-04-15 (×5): qty 1

## 2019-04-15 MED ORDER — FUROSEMIDE 10 MG/ML IJ SOLN
20.0000 mg | Freq: Once | INTRAMUSCULAR | Status: AC
  Administered 2019-04-15: 18:00:00 20 mg via INTRAVENOUS
  Filled 2019-04-15: qty 2

## 2019-04-15 MED ORDER — DEXTROSE 50 % IV SOLN
25.0000 mL | Freq: Once | INTRAVENOUS | Status: AC
  Administered 2019-04-15: 25 mL via INTRAVENOUS
  Filled 2019-04-15: qty 50

## 2019-04-15 MED ORDER — INSULIN ASPART 100 UNIT/ML IV SOLN
10.0000 [IU] | Freq: Once | INTRAVENOUS | Status: AC
  Administered 2019-04-15: 10 [IU] via INTRAVENOUS

## 2019-04-15 NOTE — Plan of Care (Signed)
  Problem: Respiratory: Goal: Will maintain a patent airway Outcome: Progressing Goal: Complications related to the disease process, condition or treatment will be avoided or minimized Outcome: Progressing   

## 2019-04-15 NOTE — NC FL2 (Signed)
[place] LEVEL OF CARE SCREENING TOOL     IDENTIFICATION  Patient Name: Cynthia Gardner Birthdate: 1945-04-19 Sex: female Admission Date (Current Location): 04/06/2019  [facility] and [place] Number:  [employer] and Address:  [facility], [place] [address] [place], [place] [address]      Provider Number: [id number]  Attending Physician Name and Address:  Jonetta Osgood, MD  Relative Name and Phone Number:       Current Level of Care: Hospital Recommended Level of Care: [place] Prior Approval Number:    Date Approved/Denied:   PASRR Number: [id number]  Discharge Plan: SNF    Current Diagnoses: Patient Active Problem List   Diagnosis Date Noted  . Acute respiratory failure with hypoxemia ([place]) 04/06/2019  . Pneumonia due to COVID-19 virus 04/06/2019  . HTN (hypertension) 04/06/2019  . Hypothyroidism 04/06/2019  . CLL (chronic lymphocytic leukemia) ([place]) 04/06/2019  . CVA (cerebral vascular accident) ([place]) 04/06/2019  . Atrial fibrillation with RVR ([place]) 04/06/2019    Orientation RESPIRATION BLADDER Height & Weight     Self, Time, Situation, Place  O2(see DC Summary) Incontinent Weight: 179 lb 14.3 oz (81.6 kg) Height:  5\' 6"  (167.6 cm)  BEHAVIORAL SYMPTOMS/MOOD NEUROLOGICAL BOWEL NUTRITION STATUS      Continent    AMBULATORY STATUS COMMUNICATION OF NEEDS Skin   Limited Assist Verbally Normal                       Personal Care Assistance Level of Assistance  Bathing, Feeding, Dressing Bathing Assistance: Limited assistance Feeding assistance: Limited assistance Dressing Assistance: Limited assistance     Functional Limitations Info             SPECIAL CARE FACTORS FREQUENCY  PT (By licensed PT), OT (By licensed OT)     PT Frequency: 5x/wk OT Frequency: 5x/wk            Contractures Contractures Info: Not present    Additional Factors Info  Code Status, Allergies, Insulin  Sliding Scale Code Status Info: Full Allergies Info: NKA   Insulin Sliding Scale Info: 0-9 units 3x/day with meals       Current Medications (04/15/2019):  This is the current hospital active medication list Current Facility-Administered Medications  Medication Dose Route Frequency Provider Last Rate Last Admin  . 0.9 %  sodium chloride infusion  250 mL Intravenous PRN Jonetta Osgood, MD   Stopped at 04/07/19 0030  . acetaminophen (TYLENOL) tablet 650 mg  650 mg Oral Q6H PRN Ghimire, Henreitta Leber, MD      . albuterol (VENTOLIN HFA) 108 (90 Base) MCG/ACT inhaler 2 puff  2 puff Inhalation Q6H Ghimire, Henreitta Leber, MD   2 puff at 04/15/19 1320  . apixaban (ELIQUIS) tablet 5 mg  5 mg Oral BID Jonetta Osgood, MD   5 mg at 04/15/19 0803  . ascorbic acid (VITAMIN C) tablet 500 mg  500 mg Oral Daily Jonetta Osgood, MD   500 mg at 04/15/19 0802  . Chlorhexidine Gluconate Cloth 2 % PADS 6 each  6 each Topical Q0600 Peyton Bottoms, MD   6 each at 04/15/19 0532  . chlorpheniramine-HYDROcodone (TUSSIONEX) 10-8 MG/5ML suspension 5 mL  5 mL Oral Q12H PRN Jonetta Osgood, MD      . diltiazem (CARDIZEM) injection 10 mg  10 mg Intravenous Q4H PRN Jonetta Osgood, MD   10 mg at 04/10/19 1906  .  feeding supplement (ENSURE ENLIVE) (ENSURE ENLIVE) liquid 237 mL  237 mL Oral BID BM Jonetta Osgood, MD   237 mL at 04/15/19 1321  . fluticasone (FLONASE) 50 MCG/ACT nasal spray 2 spray  2 spray Each Nare Daily Jonetta Osgood, MD   2 spray at 04/15/19 0804  . folic acid (FOLVITE) tablet 1 mg  1 mg Oral Daily Jonetta Osgood, MD   1 mg at 04/15/19 0803  . guaiFENesin-dextromethorphan (ROBITUSSIN DM) 100-10 MG/5ML syrup 10 mL  10 mL Oral Q4H PRN Ghimire, Henreitta Leber, MD      . insulin aspart (novoLOG) injection 0-9 Units  0-9 Units Subcutaneous TID WC Jonetta Osgood, MD   3 Units at 04/14/19 1724  . levothyroxine (SYNTHROID) tablet 50 mcg  50 mcg Oral QAC breakfast Jonetta Osgood, MD   50 mcg at  04/15/19 0523  . MEDLINE mouth rinse  15 mL Mouth Rinse BID Jonetta Osgood, MD   15 mL at 04/15/19 0808  . menthol-cetylpyridinium (CEPACOL) lozenge 3 mg  1 lozenge Oral PRN Jonetta Osgood, MD      . metoprolol tartrate (LOPRESSOR) tablet 50 mg  50 mg Oral BID Jonetta Osgood, MD   50 mg at 04/15/19 0803  . ondansetron (ZOFRAN) tablet 4 mg  4 mg Oral Q6H PRN Ghimire, Henreitta Leber, MD       Or  . ondansetron (ZOFRAN) injection 4 mg  4 mg Intravenous Q6H PRN Ghimire, Henreitta Leber, MD      . oxymetazoline (AFRIN) 0.05 % nasal spray 3 spray  3 spray Each Nare BID PRN Jonetta Osgood, MD   3 spray at 04/10/19 0920  . pantoprazole (PROTONIX) EC tablet 40 mg  40 mg Oral Daily Jonetta Osgood, MD   40 mg at 04/15/19 0802  . polyethylene glycol (MIRALAX / GLYCOLAX) packet 17 g  17 g Oral Daily PRN Ghimire, Henreitta Leber, MD      . sodium chloride (OCEAN) 0.65 % nasal spray 1 spray  1 spray Each Nare PRN Jonetta Osgood, MD   1 spray at 04/11/19 0422  . sodium chloride flush (NS) 0.9 % injection 3 mL  3 mL Intravenous Q12H Jonetta Osgood, MD   3 mL at 04/15/19 0807  . sodium chloride flush (NS) 0.9 % injection 3 mL  3 mL Intravenous Q12H Jonetta Osgood, MD   3 mL at 04/15/19 0805  . sodium chloride flush (NS) 0.9 % injection 3 mL  3 mL Intravenous PRN Ghimire, Henreitta Leber, MD      . sodium zirconium cyclosilicate (LOKELMA) packet 10 g  10 g Oral BID Jonetta Osgood, MD   10 g at 04/15/19 0803  . zinc sulfate capsule 220 mg  220 mg Oral Daily Jonetta Osgood, MD   220 mg at 04/15/19 [id number]     Discharge Medications: Please see discharge summary for a list of discharge medications.  Relevant Imaging Results:  Relevant Lab Results:   Additional Information SS#: [id number]  Geralynn Ochs, LCSW

## 2019-04-15 NOTE — Progress Notes (Addendum)
Physical Therapy Treatment Patient Details Name: Cynthia Gardner MRN: [id number] DOB: [birth year] Today's Date: 04/15/2019    History of Present Illness Patient is a 74 y.o. 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 fib with RVR in the setting of COVID-19 pneumonia    PT Comments    Pt making steady progress. Pt now agreeable to ST-SNF which continues to be appropriate.    Follow Up Recommendations  SNF;Supervision/Assistance - 24 hour     Equipment Recommendations  Other (comment);3in1 (PT)(rollator)    Recommendations for Other Services       Precautions / Restrictions Precautions Precautions: Fall Precaution Comments: 3-6L  Restrictions Weight Bearing Restrictions: No    Mobility  Bed Mobility Overal bed mobility: Needs Assistance Bed Mobility: Supine to Sit     Supine to sit: Supervision;HOB elevated     General bed mobility comments: Assist for lines/safety.   Transfers Overall transfer level: Needs assistance Equipment used: 4-wheeled walker;1 person hand held assist Transfers: Sit to/from [facility] Sit to Stand: Min assist;Min guard Stand pivot transfers: Min assist       General transfer comment: Assist to bring hips up and for balance. Bed to bsc with hand held assist. Verbal cues for hand placement. Improvement with repeated stands  Ambulation/Gait Ambulation/Gait assistance: Min assist;+2 safety/equipment Gait Distance (Feet): 30 Feet(30' x 1, 20' x 1, 25' x 1) Assistive device: 4-wheeled walker Gait Pattern/deviations: Step-through pattern;Decreased step length - right;Decreased step length - left;Shuffle Gait velocity: decr Gait velocity interpretation: <1.31 ft/sec, indicative of household  ambulator General Gait Details: Assist for balance. Pt with several sitting rest breaks. Amb on 4-6L of O2   Stairs             Wheelchair Mobility    Modified Rankin (Stroke Patients Only)       Balance Overall balance assessment: Needs assistance;History of Falls Sitting-balance support: No upper extremity supported Sitting balance-Leahy Scale: Fair     Standing balance support: Single extremity supported;Bilateral upper extremity supported;During functional activity Standing balance-Leahy Scale: Poor Standing balance comment: UE support and min guard for static standing. Min assist for dynamic                            Cognition Arousal/Alertness: Awake/alert Behavior During Therapy: Anxious Overall Cognitive Status: No family/caregiver present to determine baseline cognitive functioning                           Safety/Judgement: Decreased awareness of deficits            Exercises Other Exercises Other Exercises: flutter valve x 5 Other Exercises: Incentive spirometer x 5    General Comments General comments (skin integrity, edema, etc.): Pt on 4-6L O2. Difficult to get reading on SpO2.       Pertinent Vitals/Pain Pain Assessment: No/denies pain    Home Living                      Prior Function            PT Goals (current goals can now be found in the care plan section) Acute Rehab PT Goals Patient Stated Goal: to get better and  go home Progress towards PT goals: Progressing toward goals    Frequency    Min 3X/week      PT Plan Current plan remains appropriate    Co-evaluation              AM-PAC PT "6 Clicks" Mobility   Outcome Measure  Help needed turning from your back to your side while in a flat bed without using bedrails?: A Little Help needed moving from lying on your back to sitting on the side of a flat bed without using bedrails?: A Little Help needed moving to and from a bed to a  chair (including a wheelchair)?: A Little Help needed standing up from a chair using your arms (e.g., wheelchair or bedside chair)?: A Little Help needed to walk in hospital room?: A Little Help needed climbing 3-5 steps with a railing? : Total 6 Click Score: 16    End of Session Equipment Utilized During Treatment: Oxygen Activity Tolerance: Patient tolerated treatment well Patient left: with call bell/phone within reach;in chair;with chair alarm set Nurse Communication: Mobility status PT Visit Diagnosis: Unsteadiness on feet (R26.81);History of falling (Z91.81);Difficulty in walking, not elsewhere classified (R26.2)     Time: [id number] PT Time Calculation (min) (ACUTE ONLY): 25 min  Charges:  $Gait Training: 23-37 mins                     [place] Pager [phone] Office [place] 04/15/2019, 10:03 AM

## 2019-04-15 NOTE — Progress Notes (Signed)
Called to update patient's spouse with no answer on the phone.  Called and spoke with patient's son and updated him on her status.  No further questions at this time.

## 2019-04-15 NOTE — Progress Notes (Addendum)
PROGRESS NOTE                                                                                                                                                                                                             Patient Demographics:    Cynthia Gardner, is a 74 y.o. female, DOB - 03-22-1946, [id number]  Outpatient Primary MD for the patient is Cox, Elnita Maxwell, MD   Admit date - 04/06/2019   LOS - 9  No chief complaint on file.      Brief Narrative: Patient is a 74 y.o. 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 fib with RVR in the setting of COVID-19 pneumonia.  She was then transferred to the hospitalist service at [facility] on 1/6.  Please see below for further details.   Subjective:   Stable on 3 L of oxygen-agreeable to go to SNF.   Assessment  & Plan :   Acute Hypoxic Resp Failure due to Covid 19 Viral pneumonia: Improved and now stable on 3 L at rest-but requires up to 6 L with ambulation.  Has completed a course of remdesivir/steroids and IV antibiotics (for a mildly elevated procalcitonin level-doubt bacterial infection).    Fever: afebrile  O2 requirements:  SpO2: 91 % O2 Flow Rate (L/min): 3 L/min   COVID-19 Labs: Recent Labs    04/13/19 0357 04/14/19 0500 04/15/19 0530  DDIMER 1.40* 1.12* 1.20*  FERRITIN 1,248* 1,162* 1,313*  CRP 0.8 <0.5 0.6       Component Value Date/Time   BNP 151.2 (H) 04/06/2019 1000    No results for input(s): PROCALCITON in the last 168 hours.  No results found for: SARSCOV2NAA   COVID-19 medications: Remdesivir: 1/4>>1/8 Steroids: 1/4>>1/14 Actemra: 1/5 at [facility]  Other  medications: Rocephin:1/4>> 1/8 Zithromax:1/4>>1/6  Prone/Incentive Spirometry: encouraged patient to lie prone for 3-4 hours at a time for a total of 16 hours a day, and to encourage incentive spirometry use 3-4/hour.  DVT Prophylaxis  : Eliquis  Atrial fibrillation with RVR: Back in sinus rhythm-continue Lopressor-no longer on IV heparin-now on Eliquis.    Minimally elevated troponin: Trend is flat  per documentation from [facility]-likely secondary demand ischemia.  Note-04/04/18 TTE: EF 60-65%-with no regional wall motion abnormality (done at [place])  Hyperkalemia: Persists-repeat insulin/D50-continue Lokelma-we will give 1 dose of Lasix later today.  Hypernatremia:  Resolved-no longer on D5W.  AKI:  Improving-likely hemodynamically mediated.  Follow.  Transaminitis:  Mild transaminitis continues-this is secondary to COVID-19.  Follow.  Hypothyroidism: Continue Synthroid  History of CVA: On anticoagulation given new onset A. fib.  Plavix has been discontinued-as there is no indication for Plavix at this point-especially now that she is on anticoagulation.    History of CLL: Per documentation from [place]- baseline WBC count around 20-25,000-currently WBC count close to 100,000.  Spoke with Dr. Benay Spice over the phone on 1/10-recommends supportive care-with steroids-white count can even go higher per oncology.  GI prophylaxis: PPI  Consults  :  None  Procedures  :  None/  ABG: No results found for: PHART, PCO2ART, PO2ART, HCO3, TCO2, ACIDBASEDEF, O2SAT  Vent Settings: N/A  Condition -  Guarded  Family Communication  : Son updated over the phone on 1/15  Code Status :  Full Code  Diet :  Diet Order            Diet heart healthy/carb modified Room service appropriate? Yes; Fluid consistency: Thin  Diet effective now               Disposition Plan  :  Remain hospitalized-Home with home health services versus SNF (if she agrees) hopefully in the next 1-2 days.  Barriers  to discharge: Hypoxia requiring O2 supplementation  Antimicorbials  :    Anti-infectives (From admission, onward)   Start     Dose/Rate Route Frequency Ordered Stop   04/07/19 1000  remdesivir 100 mg in sodium chloride 0.9 % 100 mL IVPB  Status:  Discontinued     100 mg 200 mL/hr over 30 Minutes Intravenous Daily 04/06/19 0719 04/06/19 0731   04/06/19 1630  remdesivir 100 mg in sodium chloride 0.9 % 100 mL IVPB     100 mg 200 mL/hr over 30 Minutes Intravenous Daily 04/06/19 0733 04/08/19 1108   04/06/19 1330  cefTRIAXone (ROCEPHIN) 1 g in sodium chloride 0.9 % 100 mL IVPB     1 g 200 mL/hr over 30 Minutes Intravenous Every 24 hours 04/06/19 1251 04/08/19 1358   04/06/19 1330  azithromycin (ZITHROMAX) tablet 500 mg     500 mg Oral Daily 04/06/19 1251 04/06/19 1510   04/06/19 0730  remdesivir 200 mg in sodium chloride 0.9% 250 mL IVPB  Status:  Discontinued     200 mg 580 mL/hr over 30 Minutes Intravenous Once 04/06/19 0719 04/06/19 0731      Inpatient Medications  Scheduled Meds: . albuterol  2 puff Inhalation Q6H  . apixaban  5 mg Oral BID  . vitamin C  500 mg Oral Daily  . Chlorhexidine Gluconate Cloth  6 each Topical Q0600  . feeding supplement (ENSURE ENLIVE)  237 mL Oral BID BM  . fluticasone  2 spray Each Nare Daily  . folic acid  1 mg Oral Daily  . insulin aspart  0-9 Units Subcutaneous TID WC  . levothyroxine  50 mcg Oral QAC breakfast  . mouth rinse  15 mL Mouth Rinse BID  . metoprolol tartrate  50 mg Oral BID  . pantoprazole  40 mg Oral Daily  . sodium chloride flush  3 mL Intravenous Q12H  . sodium chloride flush  3 mL Intravenous Q12H  . sodium zirconium  cyclosilicate  10 g Oral BID  . zinc sulfate  220 mg Oral Daily   Continuous Infusions: . sodium chloride Stopped (04/07/19 0030)   PRN Meds:.sodium chloride, acetaminophen, chlorpheniramine-HYDROcodone, diltiazem, guaiFENesin-dextromethorphan, menthol-cetylpyridinium, ondansetron **OR** ondansetron (ZOFRAN)  IV, oxymetazoline, polyethylene glycol, sodium chloride, sodium chloride flush   Time Spent in minutes  25  See all Orders from today for further details  Oren Binet M.D on 04/15/2019 at 2:38 PM  To page go to [email or web address] - use universal password  [facility] -  Office  [phone]    Objective:   Vitals:   04/14/19 2315 04/15/19 0445 04/15/19 0725 04/15/19 1110  BP: (!) 156/99 (!) 152/73 (!) 142/82 109/65  Pulse: 87 76 91 99  Resp: 16 20 20 17   Temp: 98.2 F (36.8 C) 98.3 F (36.8 C) (!) 97.3 F (36.3 C) 97.7 F (36.5 C)  TempSrc: Oral Oral Oral Oral  SpO2: 93% 92% 92% 91%  Weight:      Height:        Wt Readings from Last 3 Encounters:  04/06/19 81.6 kg     Intake/Output Summary (Last 24 hours) at 04/15/2019 1438 Last data filed at 04/15/2019 1003 Gross per 24 hour  Intake 606 ml  Output 300 ml  Net 306 ml     Physical Exam    Data Review:    CBC Recent Labs  Lab 04/09/19 0500 04/09/19 0500 04/10/19 0340 04/10/19 0340 04/11/19 0415 04/12/19 0550 04/13/19 0357 04/14/19 0500 04/15/19 0530  WBC 70.8*   < > 99.1*   < > 108.2* 104.7* 99.9* 99.4* 96.1*  HGB 12.4   < > 12.3   < > 11.9* 10.6* 11.1* 10.1* 10.2*  HCT 38.7   < > 38.9   < > 38.4 33.8* 35.5* 32.7* 33.0*  PLT 275   < > 302   < > 298 246 219 186 179  MCV 92.1   < > 92.6   < > 93.9 93.9 95.2 95.1 96.5  MCH 29.5   < > 29.3   < > 29.1 29.4 29.8 29.4 29.8  MCHC 32.0   < > 31.6   < > 31.0 31.4 31.3 30.9 30.9  RDW 14.9   < > 14.8   < > 14.6 14.6 14.4 14.6 15.2  LYMPHSABS 53.2*  --  73.8*  --  79.6*  --   --   --   --   MONOABS 2.3*  --  3.2*  --  3.0*  --   --   --   --   EOSABS 0.0  --  0.0  --  0.0  --   --   --   --   BASOSABS 0.2*  --  0.1  --  0.2*  --   --   --   --    < > = values in this interval not displayed.    Chemistries  Recent Labs  Lab 04/09/19 0500 04/10/19 0340 04/11/19 0415 04/11/19 0415 04/12/19 0550 04/13/19 0357 04/14/19 0500 04/14/19 1630  04/15/19 0530  NA 147*   < > 148*   < > 140 142 140 140 141  K 4.6   < > 4.6   < > 4.8 4.8 6.1* 5.8* 5.8*  CL 111   < > 106   < > 103 106 105 104 104  CO2 26   < > 28   < > 29 26 27 25 28   GLUCOSE 147*   < >  142*   < > 160* 130* 114* 203* 106*  BUN 53*   < > 61*   < > 61* 59* 57* 60* 53*  CREATININE 1.30*   < > 1.24*   < > 1.14* 1.18* 1.10* 1.44* 1.11*  CALCIUM 8.6*   < > 8.4*   < > 8.5* 8.5* 8.1* 8.1* 8.1*  MG 2.1  --   --   --   --   --   --   --   --   AST 54*   < > 41  --  38 46* 54*  --  55*  ALT 65*   < > 55*  --  55* 60* 71*  --  75*  ALKPHOS 87   < > 81  --  82 74 71  --  72  BILITOT 0.9   < > 1.6*  --  1.1 1.7* 1.0  --  1.0   < > = values in this interval not displayed.   ------------------------------------------------------------------------------------------------------------------ No results for input(s): CHOL, HDL, LDLCALC, TRIG, CHOLHDL, LDLDIRECT in the last 72 hours.  Lab Results  Component Value Date   HGBA1C 6.1 (H) 04/06/2019   ------------------------------------------------------------------------------------------------------------------ No results for input(s): TSH, T4TOTAL, T3FREE, THYROIDAB in the last 72 hours.  Invalid input(s): FREET3 ------------------------------------------------------------------------------------------------------------------ Recent Labs    04/14/19 0500 04/15/19 0530  FERRITIN 1,162* 1,313*    Coagulation profile No results for input(s): INR, PROTIME in the last 168 hours.  Recent Labs    04/14/19 0500 04/15/19 0530  DDIMER 1.12* 1.20*    Cardiac Enzymes No results for input(s): CKMB, TROPONINI, MYOGLOBIN in the last 168 hours.  Invalid input(s): CK ------------------------------------------------------------------------------------------------------------------    Component Value Date/Time   BNP 151.2 (H) 04/06/2019 1000    Micro Results No results found for this or any previous visit (from the past 240  hour(s)).  Radiology Reports DG Chest [place] AM  Result Date: 04/12/2019 CLINICAL DATA:  Shortness of breath. EXAM: PORTABLE CHEST 1 VIEW COMPARISON:  04/05/2019.  04/04/2019. FINDINGS: Interim removal of right IJ line. Right PICC line noted with tip over superior vena cava. Heart size stable. Diffuse progressive bilateral pulmonary infiltrates. No pleural effusion. No pneumothorax. IMPRESSION: 1. Interim removal of right IJ line. Right PICC line noted with tip over superior vena cava. 2.  Diffuse progressive bilateral pulmonary infiltrates. Electronically Signed   By: Marcello Moores  Register   On: 04/12/2019 07:14   [place] EKG SITE RITE  Result Date: 04/08/2019 If Site Rite image not attached, placement could not be confirmed due to current cardiac rhythm.  [place] EKG SITE RITE  Result Date: 04/06/2019 If Site Rite image not attached, placement could not be confirmed due to current cardiac rhythm.

## 2019-04-16 LAB — FERRITIN: Ferritin: 1400 ng/mL — ABNORMAL HIGH (ref 11–307)

## 2019-04-16 LAB — CBC
HCT: 33.7 % — ABNORMAL LOW (ref 36.0–46.0)
Hemoglobin: 10.5 g/dL — ABNORMAL LOW (ref 12.0–15.0)
MCH: 30.3 pg (ref 26.0–34.0)
MCHC: 31.2 g/dL (ref 30.0–36.0)
MCV: 97.1 fL (ref 80.0–100.0)
Platelets: 195 10*3/uL (ref 150–400)
RBC: 3.47 MIL/uL — ABNORMAL LOW (ref 3.87–5.11)
RDW: 15.7 % — ABNORMAL HIGH (ref 11.5–15.5)
WBC: 100.4 10*3/uL (ref 4.0–10.5)
nRBC: 0 % (ref 0.0–0.2)

## 2019-04-16 LAB — COMPREHENSIVE METABOLIC PANEL WITH GFR
ALT: 85 U/L — ABNORMAL HIGH (ref 0–44)
AST: 62 U/L — ABNORMAL HIGH (ref 15–41)
Albumin: 3.2 g/dL — ABNORMAL LOW (ref 3.5–5.0)
Alkaline Phosphatase: 77 U/L (ref 38–126)
Anion gap: 12 (ref 5–15)
BUN: 54 mg/dL — ABNORMAL HIGH (ref 8–23)
CO2: 28 mmol/L (ref 22–32)
Calcium: 8.1 mg/dL — ABNORMAL LOW (ref 8.9–10.3)
Chloride: 100 mmol/L (ref 98–111)
Creatinine, Ser: 1.18 mg/dL — ABNORMAL HIGH (ref 0.44–1.00)
GFR calc Af Amer: 53 mL/min — ABNORMAL LOW
GFR calc non Af Amer: 45 mL/min — ABNORMAL LOW
Glucose, Bld: 88 mg/dL (ref 70–99)
Potassium: 4 mmol/L (ref 3.5–5.1)
Sodium: 140 mmol/L (ref 135–145)
Total Bilirubin: 1.1 mg/dL (ref 0.3–1.2)
Total Protein: 5.2 g/dL — ABNORMAL LOW (ref 6.5–8.1)

## 2019-04-16 LAB — C-REACTIVE PROTEIN: CRP: 0.5 mg/dL

## 2019-04-16 LAB — GLUCOSE, CAPILLARY
Glucose-Capillary: 77 mg/dL (ref 70–99)
Glucose-Capillary: 108 mg/dL — ABNORMAL HIGH (ref 70–99)
Glucose-Capillary: 96 mg/dL (ref 70–99)

## 2019-04-16 LAB — D-DIMER, QUANTITATIVE: D-Dimer, Quant: 1.15 ug{FEU}/mL — ABNORMAL HIGH (ref 0.00–0.50)

## 2019-04-16 NOTE — Progress Notes (Signed)
PROGRESS NOTE                                                                                                                                                                                                             Patient Demographics:    Cynthia Gardner, is a 74 y.o. female, DOB - Sep 13, 1945, [id number]  Outpatient Primary MD for the patient is Cox, Elnita Maxwell, MD   Admit date - 04/06/2019   LOS - 10  No chief complaint on file.      Brief Narrative: Patient is a 74 y.o. 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 fib with RVR in the setting of COVID-19 pneumonia.  She was then transferred to the hospitalist service at [facility] on 1/6.  She was treated with steroids/remdesivir/Actemra-with slow gradual improvement-over the past several days-she has been stable on just 3 L of oxygen.  Plans are to discharge to SNF when bed available.  Please see below for further details.  COVID-19 medications: Remdesivir: 1/4>>1/8 Steroids: 1/4>>1/14 Actemra: 1/5 at [facility]  Other medications: Rocephin:1/4>> 1/8 Zithromax:1/4>>1/6   Subjective:   Sitting up and eating breakfast-stable on just 3 L of oxygen.  Per nursing staff-no major events overnight.  Patient does not have any complaints.   Assessment  & Plan :   Acute Hypoxic Resp Failure due to Covid 19 Viral pneumonia: Significantly improved over the past few days-stable on just 3 L of oxygen-but requires 6 L with minimal ambulation.  Plans are to discharge to SNF.    Has completed a course of remdesivir/steroids and IV antibiotics (for a mildly elevated procalcitonin level-doubt bacterial infection).    Fever:  afebrile  O2 requirements:  SpO2: 93 % O2 Flow Rate (L/min): 3 L/min   COVID-19 Labs: Recent Labs    04/14/19 0500 04/15/19 0530 04/16/19 0442  DDIMER 1.12* 1.20* 1.15*  FERRITIN 1,162* 1,313* 1,400*  CRP <0.5 0.6 0.5       Component Value Date/Time   BNP 151.2 (H) 04/06/2019 1000    No results for input(s): PROCALCITON in the last 168 hours.  No results found for: SARSCOV2NAA  Prone/Incentive Spirometry: encouraged patient to lie prone for 3-4 hours at a time for a total of 16 hours a day, and to encourage incentive spirometry use 3-4/hour.  DVT Prophylaxis  : Eliquis  Atrial fibrillation with RVR: Back in sinus rhythm-continue Lopressor-no longer on IV heparin-now on Eliquis.    Minimally elevated troponin: Trend is flat per documentation from [facility]-likely secondary demand ischemia.  Note-04/04/18 TTE: EF 60-65%-with no regional wall motion abnormality (done at [place])  Hyperkalemia: Resolved after numerous doses of insulin/D50-continue Lokelma for a few more days.  Repeat electrolytes tomorrow.   Hypernatremia:  Resolved-no longer on D5W.  AKI:  Improving-likely hemodynamically mediated.  Follow.  Transaminitis:  Mild transaminitis continues-this is secondary to COVID-19.  Follow.  Hypothyroidism: Continue Synthroid  History of CVA: On anticoagulation given new onset A. fib.  Plavix has been discontinued-as there is no indication for Plavix at this point-especially now that she is on anticoagulation.    History of CLL: Per documentation from [place]- baseline WBC count around 20-25,000-currently WBC count close to 100,000-likely secondary to acute illness/steroid use.  Spoke with Dr. Benay Spice over the phone on 1/10-recommends supportive care-with steroids-white count can even go higher per oncology.  GI prophylaxis: PPI  Consults  :  None  Procedures  :  None/  ABG: No results found for: PHART, PCO2ART, PO2ART, HCO3, TCO2, ACIDBASEDEF, O2SAT  Vent Settings:  N/A  Condition -  Guarded  Family Communication  : Son updated over the phone on 1/16  Code Status :  Full Code  Diet :  Diet Order            Diet heart healthy/carb modified Room service appropriate? Yes; Fluid consistency: Thin  Diet effective now               Disposition Plan  :  Remain hospitalized-patient has agreed to go to SNF (initially refused) social work aware and looking for SNF bed..  Barriers to discharge: Hypoxia requiring O2 supplementation  Antimicorbials  :    Anti-infectives (From admission, onward)   Start     Dose/Rate Route Frequency Ordered Stop   04/07/19 1000  remdesivir 100 mg in sodium chloride 0.9 % 100 mL IVPB  Status:  Discontinued     100 mg 200 mL/hr over 30 Minutes Intravenous Daily 04/06/19 0719 04/06/19 0731   04/06/19 1630  remdesivir 100 mg in sodium chloride 0.9 % 100 mL IVPB     100 mg 200 mL/hr over 30 Minutes Intravenous Daily 04/06/19 0733 04/08/19 1108   04/06/19 1330  cefTRIAXone (ROCEPHIN) 1 g in sodium chloride 0.9 % 100 mL IVPB     1 g 200 mL/hr over 30 Minutes Intravenous Every 24 hours 04/06/19 1251 04/08/19 1358   04/06/19 1330  azithromycin (ZITHROMAX) tablet 500 mg     500 mg Oral Daily 04/06/19 1251 04/06/19 1510   04/06/19 0730  remdesivir 200 mg in sodium chloride 0.9% 250 mL IVPB  Status:  Discontinued     200 mg 580 mL/hr over 30 Minutes Intravenous Once 04/06/19 0719 04/06/19 0731      Inpatient Medications  Scheduled Meds: . albuterol  2 puff Inhalation Q6H  . apixaban  5 mg Oral BID  . vitamin C  500 mg Oral Daily  . Chlorhexidine Gluconate Cloth  6 each Topical Q0600  . feeding supplement (ENSURE ENLIVE)  237 mL Oral BID BM  . fluticasone  2 spray Each Nare Daily  . folic acid  1 mg Oral  Daily  . insulin aspart  0-9 Units Subcutaneous TID WC  . levothyroxine  50 mcg Oral QAC breakfast  . mouth rinse  15 mL Mouth Rinse BID  . metoprolol tartrate  50 mg Oral BID  . pantoprazole  40 mg Oral Daily  .  sodium chloride flush  3 mL Intravenous Q12H  . sodium chloride flush  3 mL Intravenous Q12H  . sodium zirconium cyclosilicate  10 g Oral BID  . zinc sulfate  220 mg Oral Daily   Continuous Infusions: . sodium chloride Stopped (04/07/19 0030)   PRN Meds:.sodium chloride, acetaminophen, chlorpheniramine-HYDROcodone, diltiazem, guaiFENesin-dextromethorphan, menthol-cetylpyridinium, ondansetron **OR** ondansetron (ZOFRAN) IV, oxymetazoline, polyethylene glycol, sodium chloride, sodium chloride flush   Time Spent in minutes  25  See all Orders from today for further details  Oren Binet M.D on 04/16/2019 at 2:21 PM  To page go to [email or web address] - use universal password  [facility] -  Office  [phone]    Objective:   Vitals:   04/15/19 1945 04/16/19 0335 04/16/19 0735 04/16/19 1115  BP: (!) 150/84 111/63 103/67 (!) 117/59  Pulse: 85 78 81 83  Resp: (!) 23 (!) 24 (!) 23 (!) 21  Temp: 97.6 F (36.4 C) 97.8 F (36.6 C) 97.6 F (36.4 C) 97.9 F (36.6 C)  TempSrc: Oral Oral Oral Oral  SpO2: 100% 90% 91% 93%  Weight:      Height:        Wt Readings from Last 3 Encounters:  04/06/19 81.6 kg     Intake/Output Summary (Last 24 hours) at 04/16/2019 1421 Last data filed at 04/16/2019 0958 Gross per 24 hour  Intake 660 ml  Output 2350 ml  Net -1690 ml     Physical Exam Gen Exam:Alert awake-not in any distress HEENT:atraumatic, normocephalic Chest: B/L clear to auscultation anteriorly CVS:S1S2 regular Abdomen:soft non tender, non distended Extremities:no edema Neurology: Non focal Skin: no rash   Data Review:    CBC Recent Labs  Lab 04/10/19 0340 04/10/19 0340 04/11/19 0415 04/11/19 0415 04/12/19 0550 04/13/19 0357 04/14/19 0500 04/15/19 0530 04/16/19 0442  WBC 99.1*   < > 108.2*   < > 104.7* 99.9* 99.4* 96.1* 100.4*  HGB 12.3   < > 11.9*   < > 10.6* 11.1* 10.1* 10.2* 10.5*  HCT 38.9   < > 38.4   < > 33.8* 35.5* 32.7* 33.0* 33.7*  PLT 302   <  > 298   < > 246 219 186 179 195  MCV 92.6   < > 93.9   < > 93.9 95.2 95.1 96.5 97.1  MCH 29.3   < > 29.1   < > 29.4 29.8 29.4 29.8 30.3  MCHC 31.6   < > 31.0   < > 31.4 31.3 30.9 30.9 31.2  RDW 14.8   < > 14.6   < > 14.6 14.4 14.6 15.2 15.7*  LYMPHSABS 73.8*  --  79.6*  --   --   --   --   --   --   MONOABS 3.2*  --  3.0*  --   --   --   --   --   --   EOSABS 0.0  --  0.0  --   --   --   --   --   --   BASOSABS 0.1  --  0.2*  --   --   --   --   --   --    < > =  values in this interval not displayed.    Chemistries  Recent Labs  Lab 04/12/19 0550 04/12/19 0550 04/13/19 0357 04/13/19 0357 04/14/19 0500 04/14/19 1630 04/15/19 0530 04/15/19 1355 04/16/19 0442  NA 140   < > 142   < > 140 140 141 141 140  K 4.8   < > 4.8   < > 6.1* 5.8* 5.8* 5.2* 4.0  CL 103   < > 106   < > 105 104 104 103 100  CO2 29   < > 26   < > 27 25 28 25 28   GLUCOSE 160*   < > 130*   < > 114* 203* 106* 146* 88  BUN 61*   < > 59*   < > 57* 60* 53* 56* 54*  CREATININE 1.14*   < > 1.18*   < > 1.10* 1.44* 1.11* 1.20* 1.18*  CALCIUM 8.5*   < > 8.5*   < > 8.1* 8.1* 8.1* 8.3* 8.1*  AST 38  --  46*  --  54*  --  55*  --  62*  ALT 55*  --  60*  --  71*  --  75*  --  85*  ALKPHOS 82  --  74  --  71  --  72  --  77  BILITOT 1.1  --  1.7*  --  1.0  --  1.0  --  1.1   < > = values in this interval not displayed.   ------------------------------------------------------------------------------------------------------------------ No results for input(s): CHOL, HDL, LDLCALC, TRIG, CHOLHDL, LDLDIRECT in the last 72 hours.  Lab Results  Component Value Date   HGBA1C 6.1 (H) 04/06/2019   ------------------------------------------------------------------------------------------------------------------ No results for input(s): TSH, T4TOTAL, T3FREE, THYROIDAB in the last 72 hours.  Invalid input(s):  FREET3 ------------------------------------------------------------------------------------------------------------------ Recent Labs    04/15/19 0530 04/16/19 0442  FERRITIN 1,313* 1,400*    Coagulation profile No results for input(s): INR, PROTIME in the last 168 hours.  Recent Labs    04/15/19 0530 04/16/19 0442  DDIMER 1.20* 1.15*    Cardiac Enzymes No results for input(s): CKMB, TROPONINI, MYOGLOBIN in the last 168 hours.  Invalid input(s): CK ------------------------------------------------------------------------------------------------------------------    Component Value Date/Time   BNP 151.2 (H) 04/06/2019 1000    Micro Results No results found for this or any previous visit (from the past 240 hour(s)).  Radiology Reports DG Chest [place] AM  Result Date: 04/12/2019 CLINICAL DATA:  Shortness of breath. EXAM: PORTABLE CHEST 1 VIEW COMPARISON:  04/05/2019.  04/04/2019. FINDINGS: Interim removal of right IJ line. Right PICC line noted with tip over superior vena cava. Heart size stable. Diffuse progressive bilateral pulmonary infiltrates. No pleural effusion. No pneumothorax. IMPRESSION: 1. Interim removal of right IJ line. Right PICC line noted with tip over superior vena cava. 2.  Diffuse progressive bilateral pulmonary infiltrates. Electronically Signed   By: Marcello Moores  Register   On: 04/12/2019 07:14   [place] EKG SITE RITE  Result Date: 04/08/2019 If Site Rite image not attached, placement could not be confirmed due to current cardiac rhythm.  [place] EKG SITE RITE  Result Date: 04/06/2019 If Site Rite image not attached, placement could not be confirmed due to current cardiac rhythm.

## 2019-04-16 NOTE — Discharge Instructions (Addendum)
Follow with Primary MD Cox, Kirsten, MD in 7 days   Get CBC, CMP, 2 view Chest X ray -  checked next visit within 1 week by Primary MD   Activity: As tolerated with Full fall precautions use walker/cane & assistance as needed  Disposition Home   Diet: Heart Healthy   Special Instructions: If you have smoked or chewed Tobacco  in the last 2 yrs please stop smoking, stop any regular Alcohol  and or any Recreational drug use.  On your next visit with your primary care physician please Get Medicines reviewed and adjusted.  Please request your Prim.MD to go over all Hospital Tests and Procedure/Radiological results at the follow up, please get all Hospital records sent to your Prim MD by signing hospital release before you go home.  If you experience worsening of your admission symptoms, develop shortness of breath, life threatening emergency, suicidal or homicidal thoughts you must seek medical attention immediately by calling 911 or calling your MD immediately  if symptoms less severe.  You Must read complete instructions/literature along with all the possible adverse reactions/side effects for all the Medicines you take and that have been prescribed to you. Take any new Medicines after you have completely understood and accpet all the possible adverse reactions/side effects.   Do not drive, operate heavy machinery, perform activities at heights, swimming or participation in water activities or provide baby sitting services if your were admitted for syncope or siezures until you have seen by Primary MD or a Neurologist and advised to do so again.  Do not drive when taking Pain medications.  Do not take more than prescribed Pain, Sleep and Anxiety Medications  Wear Seat belts while driving.   Please note  You were cared for by a hospitalist during your hospital stay. If you have any questions about your discharge medications or the care you received while you were in the hospital after you  are discharged, you can call the unit and asked to speak with the hospitalist on call if the hospitalist that took care of you is not available. Once you are discharged, your primary care physician will handle any further medical issues. Please note that NO REFILLS for any discharge medications will be authorized once you are discharged, as it is imperative that you return to your primary care physician (or establish a relationship with a primary care physician if you do not have one) for your aftercare needs so that they can reassess your need for medications and monitor your lab values.       Person Under Monitoring Name: Cynthia Gardner  Location: [address] [place] [address]   Infection Prevention Recommendations for Individuals Confirmed to have, or Being Evaluated for, 2019 Novel Coronavirus (COVID-19) Infection Who Receive Care at Home  Individuals who are confirmed to have, or are being evaluated for, COVID-19 should follow the prevention steps below until a healthcare provider or local or state health department says they can return to normal activities.  Stay home except to get medical care You should restrict activities outside your home, except for getting medical care. Do not go to work, school, or public areas, and do not use public transportation or taxis.  Call ahead before visiting your doctor Before your medical appointment, call the healthcare provider and tell them that you have, or are being evaluated for, COVID-19 infection. This will help the healthcare provider's office take steps to keep other people from getting infected. Ask your healthcare provider  to call the local or state health department.  Monitor your symptoms Seek prompt medical attention if your illness is worsening (e.g., difficulty breathing). Before going to your medical appointment, call the healthcare provider and tell them that you have, or are being evaluated for, COVID-19 infection.  Ask your healthcare provider to call the local or state health department.  Wear a facemask You should wear a facemask that covers your nose and mouth when you are in the same room with other people and when you visit a healthcare provider. People who live with or visit you should also wear a facemask while they are in the same room with you.  Separate yourself from other people in your home As much as possible, you should stay in a different room from other people in your home. Also, you should use a separate bathroom, if available.  Avoid sharing household items You should not share dishes, drinking glasses, cups, eating utensils, towels, bedding, or other items with other people in your home. After using these items, you should wash them thoroughly with soap and water.  Cover your coughs and sneezes Cover your mouth and nose with a tissue when you cough or sneeze, or you can cough or sneeze into your sleeve. Throw used tissues in a lined trash can, and immediately wash your hands with soap and water for at least 20 seconds or use an alcohol-based hand rub.  Wash your [facility] your hands often and thoroughly with soap and water for at least 20 seconds. You can use an alcohol-based hand sanitizer if soap and water are not available and if your hands are not visibly dirty. Avoid touching your eyes, nose, and mouth with unwashed hands.   Prevention Steps for Caregivers and Household Members of Individuals Confirmed to have, or Being Evaluated for, COVID-19 Infection Being Cared for in the Home  If you live with, or provide care at home for, a person confirmed to have, or being evaluated for, COVID-19 infection please follow these guidelines to prevent infection:  Follow healthcare provider's instructions Make sure that you understand and can help the patient follow any healthcare provider instructions for all care.  Provide for the patient's basic needs You should help the  patient with basic needs in the home and provide support for getting groceries, prescriptions, and other personal needs.  Monitor the patient's symptoms If they are getting sicker, call his or her medical provider and tell them that the patient has, or is being evaluated for, COVID-19 infection. This will help the healthcare provider's office take steps to keep other people from getting infected. Ask the healthcare provider to call the local or state health department.  Limit the number of people who have contact with the patient  If possible, have only one caregiver for the patient.  Other household members should stay in another home or place of residence. If this is not possible, they should stay  in another room, or be separated from the patient as much as possible. Use a separate bathroom, if available.  Restrict visitors who do not have an essential need to be in the home.  Keep older adults, very young children, and other sick people away from the patient Keep older adults, very young children, and those who have compromised immune systems or chronic health conditions away from the patient. This includes people with chronic heart, lung, or kidney conditions, diabetes, and cancer.  Ensure good ventilation Make sure that shared spaces in the home have  good air flow, such as from an air conditioner or an opened window, weather permitting.  Wash your hands often  Wash your hands often and thoroughly with soap and water for at least 20 seconds. You can use an alcohol based hand sanitizer if soap and water are not available and if your hands are not visibly dirty.  Avoid touching your eyes, nose, and mouth with unwashed hands.  Use disposable paper towels to dry your hands. If not available, use dedicated cloth towels and replace them when they become wet.  Wear a facemask and gloves  Wear a disposable facemask at all times in the room and gloves when you touch or have contact  with the patient's blood, body fluids, and/or secretions or excretions, such as sweat, saliva, sputum, nasal mucus, vomit, urine, or feces.  Ensure the mask fits over your nose and mouth tightly, and do not touch it during use.  Throw out disposable facemasks and gloves after using them. Do not reuse.  Wash your hands immediately after removing your facemask and gloves.  If your personal clothing becomes contaminated, carefully remove clothing and launder. Wash your hands after handling contaminated clothing.  Place all used disposable facemasks, gloves, and other waste in a lined container before disposing them with other household waste.  Remove gloves and wash your hands immediately after handling these items.  Do not share dishes, glasses, or other household items with the patient  Avoid sharing household items. You should not share dishes, drinking glasses, cups, eating utensils, towels, bedding, or other items with a patient who is confirmed to have, or being evaluated for, COVID-19 infection.  After the person uses these items, you should wash them thoroughly with soap and water.  Wash laundry thoroughly  Immediately remove and wash clothes or bedding that have blood, body fluids, and/or secretions or excretions, such as sweat, saliva, sputum, nasal mucus, vomit, urine, or feces, on them.  Wear gloves when handling laundry from the patient.  Read and follow directions on labels of laundry or clothing items and detergent. In general, wash and dry with the warmest temperatures recommended on the label.  Clean all areas the individual has used often  Clean all touchable surfaces, such as counters, tabletops, doorknobs, bathroom fixtures, toilets, phones, keyboards, tablets, and bedside tables, every day. Also, clean any surfaces that may have blood, body fluids, and/or secretions or excretions on them.  Wear gloves when cleaning surfaces the patient has come in contact with.  Use  a diluted bleach solution (e.g., dilute bleach with 1 part bleach and 10 parts water) or a household disinfectant with a label that says EPA-registered for coronaviruses. To make a bleach solution at home, add 1 tablespoon of bleach to 1 quart (4 cups) of water. For a larger supply, add  cup of bleach to 1 gallon (16 cups) of water.  Read labels of cleaning products and follow recommendations provided on product labels. Labels contain instructions for safe and effective use of the cleaning product including precautions you should take when applying the product, such as wearing gloves or eye protection and making sure you have good ventilation during use of the product.  Remove gloves and wash hands immediately after cleaning.  Monitor yourself for signs and symptoms of illness Caregivers and household members are considered close contacts, should monitor their health, and will be asked to limit movement outside of the home to the extent possible. Follow the monitoring steps for close contacts listed on the symptom  monitoring form.   ? If you have additional questions, contact your local health department or call the epidemiologist on call at [phone] (available 24/7). ? This guidance is subject to change. For the most up-to-date guidance from [facility], please refer to their website: [email or web address]   Information on my medicine - ELIQUIS (apixaban) Why was Eliquis prescribed for you? Eliquis was prescribed for you to reduce the risk of forming blood clots that can cause a stroke if you have a medical condition called atrial fibrillation (a type of irregular heartbeat) OR to reduce the risk of a blood clots forming after orthopedic surgery.  What do You need to know about Eliquis ? Take your Eliquis TWICE DAILY - one tablet in the morning and one tablet in the evening with or without food.  It would be best to take the doses about  the same time each day.  If you have difficulty swallowing the tablet whole please discuss with your pharmacist how to take the medication safely.  Take Eliquis exactly as prescribed by your doctor and DO NOT stop taking Eliquis without talking to the doctor who prescribed the medication.  Stopping may increase your risk of developing a new clot or stroke.  Refill your prescription before you run out.  After discharge, you should have regular check-up appointments with your healthcare provider that is prescribing your Eliquis.  In the future your dose may need to be changed if your kidney function or weight changes by a significant amount or as you get older.  What do you do if you miss a dose? If you miss a dose, take it as soon as you remember on the same day and resume taking twice daily.  Do not take more than one dose of ELIQUIS at the same time.  Important Safety Information A possible side effect of Eliquis is bleeding. You should call your healthcare provider right away if you experience any of the following: ? Bleeding from an injury or your nose that does not stop. ? Unusual colored urine (red or dark brown) or unusual colored stools (red or black). ? Unusual bruising for unknown reasons. ? A serious fall or if you hit your head (even if there is no bleeding).  Some medicines may interact with Eliquis and might increase your risk of bleeding or clotting while on Eliquis. To help avoid this, consult your healthcare provider or pharmacist prior to using any new prescription or non-prescription medications, including herbals, vitamins, non-steroidal anti-inflammatory drugs (NSAIDs) and supplements.  This website has more information on Eliquis (apixaban): [email or web address].no.

## 2019-04-17 DIAGNOSIS — J9601 Acute respiratory failure with hypoxia: Secondary | ICD-10-CM

## 2019-04-17 LAB — GLUCOSE, CAPILLARY
Glucose-Capillary: 101 mg/dL — ABNORMAL HIGH (ref 70–99)
Glucose-Capillary: 108 mg/dL — ABNORMAL HIGH (ref 70–99)
Glucose-Capillary: 75 mg/dL (ref 70–99)
Glucose-Capillary: 95 mg/dL (ref 70–99)
Glucose-Capillary: 99 mg/dL (ref 70–99)

## 2019-04-17 LAB — COMPREHENSIVE METABOLIC PANEL WITH GFR
ALT: 69 U/L — ABNORMAL HIGH (ref 0–44)
AST: 47 U/L — ABNORMAL HIGH (ref 15–41)
Albumin: 2.8 g/dL — ABNORMAL LOW (ref 3.5–5.0)
Alkaline Phosphatase: 64 U/L (ref 38–126)
Anion gap: 9 (ref 5–15)
BUN: 47 mg/dL — ABNORMAL HIGH (ref 8–23)
CO2: 30 mmol/L (ref 22–32)
Calcium: 8 mg/dL — ABNORMAL LOW (ref 8.9–10.3)
Chloride: 101 mmol/L (ref 98–111)
Creatinine, Ser: 1.06 mg/dL — ABNORMAL HIGH (ref 0.44–1.00)
GFR calc Af Amer: 60 mL/min — ABNORMAL LOW
GFR calc non Af Amer: 52 mL/min — ABNORMAL LOW
Glucose, Bld: 87 mg/dL (ref 70–99)
Potassium: 3.6 mmol/L (ref 3.5–5.1)
Sodium: 140 mmol/L (ref 135–145)
Total Bilirubin: 1.1 mg/dL (ref 0.3–1.2)
Total Protein: 4.6 g/dL — ABNORMAL LOW (ref 6.5–8.1)

## 2019-04-17 LAB — CBC
HCT: 29.8 % — ABNORMAL LOW (ref 36.0–46.0)
Hemoglobin: 9.2 g/dL — ABNORMAL LOW (ref 12.0–15.0)
MCH: 30.1 pg (ref 26.0–34.0)
MCHC: 30.9 g/dL (ref 30.0–36.0)
MCV: 97.4 fL (ref 80.0–100.0)
Platelets: 159 10*3/uL (ref 150–400)
RBC: 3.06 MIL/uL — ABNORMAL LOW (ref 3.87–5.11)
RDW: 15.9 % — ABNORMAL HIGH (ref 11.5–15.5)
WBC: 73.8 10*3/uL (ref 4.0–10.5)
nRBC: 0 % (ref 0.0–0.2)

## 2019-04-17 LAB — FERRITIN: Ferritin: 876 ng/mL — ABNORMAL HIGH (ref 11–307)

## 2019-04-17 LAB — C-REACTIVE PROTEIN: CRP: 0.6 mg/dL

## 2019-04-17 LAB — D-DIMER, QUANTITATIVE: D-Dimer, Quant: 0.84 ug{FEU}/mL — ABNORMAL HIGH (ref 0.00–0.50)

## 2019-04-17 LAB — BRAIN NATRIURETIC PEPTIDE: B Natriuretic Peptide: 35.7 pg/mL (ref 0.0–100.0)

## 2019-04-17 MED ORDER — LACTATED RINGERS IV SOLN: INTRAVENOUS | Status: AC

## 2019-04-17 NOTE — Progress Notes (Signed)
PROGRESS NOTE                                                                                                                                                                                                             Patient Demographics:    Cynthia Gardner, is a 74 y.o. female, DOB - 1945-08-06, [id number]  Outpatient Primary MD for the patient is Cox, Elnita Maxwell, MD   Admit date - 04/06/2019   LOS - 11  No chief complaint on file.      Brief Narrative: Patient is a 74 y.o. 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 fib with RVR in the setting of COVID-19 pneumonia.  She was then transferred to the hospitalist service at [facility] on 1/6.  She was treated with steroids/remdesivir/Actemra-with slow gradual improvement-over the past several days-she has been stable on just 3 L of oxygen.  Plans are to discharge to SNF when bed available.  Please see below for further details.  COVID-19 medications: Remdesivir: 1/4>>1/8 Steroids: 1/4>>1/14 Actemra: 1/5 at [facility]  Other medications: Rocephin:1/4>> 1/8 Zithromax:1/4>>1/6   Subjective:   Patient in chair, appears comfortable, denies any headache, no fever, no chest pain or pressure, no shortness of breath , no abdominal pain. No focal weakness.    Assessment  & Plan :   Acute Hypoxic Resp Failure due to Covid 19 Viral pneumonia: She had severe disease and was treated appropriately with IV steroids, remdesivir and Actemra, she is much improved now and currently down to 2 L nasal cannula oxygen upon ambulation, symptom-free, continue to titrate down oxygen and steroids.  Advance activity.    Encouraged to sit up in  chair and daytime use I-S and flutter valve for pulmonary toiletry.   Fever: afebrile  O2 requirements:  SpO2: 95 % O2 Flow Rate (L/min): 2 L/min   COVID-19 Labs: Recent Labs    04/15/19 0530 04/16/19 0442 04/17/19 0444 04/17/19 0445  DDIMER 1.20* 1.15* 0.84*  --   FERRITIN 1,313* 1,400*  --  876*  CRP 0.6 0.5  --  0.6       Component Value Date/Time  BNP 35.7 04/17/2019 0830    No results for input(s): PROCALCITON in the last 168 hours.  No results found for: SARSCOV2NAA     Atrial fibrillation with RVR with [place] vas 2 score of greater than 3: In rate control on Lopressor and Eliquis combination.   Minimally elevated troponin: Trend is flat per documentation from [facility]-likely secondary demand ischemia.  Note-04/04/18 TTE: EF 60-65%-with no regional wall motion abnormality (done at [facility])  Hyperkalemia: Kayexalate and monitor.  AKI:  Improving-likely hemodynamically mediated.  Follow.  Transaminitis:  Mild transaminitis continues-this is secondary to COVID-19.  Follow.  Hypothyroidism: Continue Synthroid, check TSH.  No results found for: TSH  History of CVA: On anticoagulation given new onset A. fib.  Plavix has been discontinued-as there is no indication for Plavix at this point-especially now that she is on anticoagulation.    History of CLL: Per documentation from [facility]- baseline WBC count around 20-25,000-currently WBC count close to 100,000-likely secondary to acute illness/steroid use.  Spoke with Dr. Benay Spice over the phone on 1/10-recommends supportive care-with steroids-white count can even go higher per oncology.   GI prophylaxis: PPI  Consults  :  None  Procedures  :  None  Condition -fair  Family Communication  : Son updated over the phone on 1/16 by previous MD, message left by me on 04/17/2019 at 12:20 PM  Code Status :  Full Code  Diet :  Diet Order            Diet heart healthy/carb modified Room service appropriate? Yes; Fluid  consistency: Thin  Diet effective now               Disposition Plan  : SNF versus HH PT.  Barriers to discharge: Hypoxia requiring O2 supplementation  Antimicorbials  :    Anti-infectives (From admission, onward)   Start     Dose/Rate Route Frequency Ordered Stop   04/07/19 1000  remdesivir 100 mg in sodium chloride 0.9 % 100 mL IVPB  Status:  Discontinued     100 mg 200 mL/hr over 30 Minutes Intravenous Daily 04/06/19 0719 04/06/19 0731   04/06/19 1630  remdesivir 100 mg in sodium chloride 0.9 % 100 mL IVPB     100 mg 200 mL/hr over 30 Minutes Intravenous Daily 04/06/19 0733 04/08/19 1108   04/06/19 1330  cefTRIAXone (ROCEPHIN) 1 g in sodium chloride 0.9 % 100 mL IVPB     1 g 200 mL/hr over 30 Minutes Intravenous Every 24 hours 04/06/19 1251 04/08/19 1358   04/06/19 1330  azithromycin (ZITHROMAX) tablet 500 mg     500 mg Oral Daily 04/06/19 1251 04/06/19 1510   04/06/19 0730  remdesivir 200 mg in sodium chloride 0.9% 250 mL IVPB  Status:  Discontinued     200 mg 580 mL/hr over 30 Minutes Intravenous Once 04/06/19 0719 04/06/19 0731      DVT Prophylaxis  : Eliquis  Inpatient Medications  Scheduled Meds: . albuterol  2 puff Inhalation Q6H  . apixaban  5 mg Oral BID  . vitamin C  500 mg Oral Daily  . Chlorhexidine Gluconate Cloth  6 each Topical Q0600  . feeding supplement (ENSURE ENLIVE)  237 mL Oral BID BM  . fluticasone  2 spray Each Nare Daily  . folic acid  1 mg Oral Daily  . insulin aspart  0-9 Units Subcutaneous TID WC  . levothyroxine  50 mcg Oral QAC breakfast  . mouth rinse  15 mL Mouth Rinse BID  .  metoprolol tartrate  50 mg Oral BID  . pantoprazole  40 mg Oral Daily  . zinc sulfate  220 mg Oral Daily   Continuous Infusions: . lactated ringers 100 mL/hr at 04/17/19 0835   PRN Meds:.acetaminophen, chlorpheniramine-HYDROcodone, diltiazem, guaiFENesin-dextromethorphan, menthol-cetylpyridinium, [DISCONTINUED] ondansetron **OR** ondansetron (ZOFRAN) IV,  oxymetazoline, polyethylene glycol, sodium chloride   Time Spent in minutes  25  See all Orders from today for further details  Lala Lund M.D on 04/17/2019 at 12:17 PM  To page go to [email or web address] - use universal password  [facility] -  Office  [phone]    Objective:   Vitals:   04/17/19 0421 04/17/19 0725 04/17/19 0841 04/17/19 1119  BP: 126/66 136/68  108/68  Pulse:  78  84  Resp:  18  19  Temp:  97.6 F (36.4 C)  97.8 F (36.6 C)  TempSrc:  Oral  Oral  SpO2:  94% 94% 95%  Weight:      Height:        Wt Readings from Last 3 Encounters:  04/06/19 81.6 kg     Intake/Output Summary (Last 24 hours) at 04/17/2019 1217 Last data filed at 04/16/2019 1849 Gross per 24 hour  Intake 240 ml  Output -  Net 240 ml     Physical Exam  Awake Alert,  No new F.N deficits, Normal affect [place].AT,PERRAL Supple Neck,No JVD, No cervical lymphadenopathy appriciated.  Symmetrical Chest wall movement, Good air movement bilaterally, CTAB RRR,No Gallops, Rubs or new Murmurs, No Parasternal Heave +ve B.Sounds, Abd Soft, No tenderness, No organomegaly appriciated, No rebound - guarding or rigidity. No Cyanosis, Clubbing or edema, No new Rash or bruise    Data Review:    CBC Recent Labs  Lab 04/11/19 0415 04/12/19 0550 04/13/19 0357 04/14/19 0500 04/15/19 0530 04/16/19 0442 04/17/19 0444  WBC 108.2*   < > 99.9* 99.4* 96.1* 100.4* 73.8*  HGB 11.9*   < > 11.1* 10.1* 10.2* 10.5* 9.2*  HCT 38.4   < > 35.5* 32.7* 33.0* 33.7* 29.8*  PLT 298   < > 219 186 179 195 159  MCV 93.9   < > 95.2 95.1 96.5 97.1 97.4  MCH 29.1   < > 29.8 29.4 29.8 30.3 30.1  MCHC 31.0   < > 31.3 30.9 30.9 31.2 30.9  RDW 14.6   < > 14.4 14.6 15.2 15.7* 15.9*  LYMPHSABS 79.6*  --   --   --   --   --   --   MONOABS 3.0*  --   --   --   --   --   --   EOSABS 0.0  --   --   --   --   --   --   BASOSABS 0.2*  --   --   --   --   --   --    < > = values in this interval not displayed.     Chemistries  Recent Labs  Lab 04/13/19 0357 04/13/19 0357 04/14/19 0500 04/14/19 0500 04/14/19 1630 04/15/19 0530 04/15/19 1355 04/16/19 0442 04/17/19 0444  NA 142   < > 140   < > 140 141 141 140 140  K 4.8   < > 6.1*   < > 5.8* 5.8* 5.2* 4.0 3.6  CL 106   < > 105   < > 104 104 103 100 101  CO2 26   < > 27   < > 25 28 25 28  30  GLUCOSE 130*   < > 114*   < > 203* 106* 146* 88 87  BUN 59*   < > 57*   < > 60* 53* 56* 54* 47*  CREATININE 1.18*   < > 1.10*   < > 1.44* 1.11* 1.20* 1.18* 1.06*  CALCIUM 8.5*   < > 8.1*   < > 8.1* 8.1* 8.3* 8.1* 8.0*  AST 46*  --  54*  --   --  55*  --  62* 47*  ALT 60*  --  71*  --   --  75*  --  85* 69*  ALKPHOS 74  --  71  --   --  72  --  77 64  BILITOT 1.7*  --  1.0  --   --  1.0  --  1.1 1.1   < > = values in this interval not displayed.   ------------------------------------------------------------------------------------------------------------------ No results for input(s): CHOL, HDL, LDLCALC, TRIG, CHOLHDL, LDLDIRECT in the last 72 hours.  Lab Results  Component Value Date   HGBA1C 6.1 (H) 04/06/2019   ------------------------------------------------------------------------------------------------------------------ No results for input(s): TSH, T4TOTAL, T3FREE, THYROIDAB in the last 72 hours.  Invalid input(s): FREET3 ------------------------------------------------------------------------------------------------------------------ Recent Labs    04/16/19 0442 04/17/19 0445  FERRITIN 1,400* 876*    Coagulation profile No results for input(s): INR, PROTIME in the last 168 hours.  Recent Labs    04/16/19 0442 04/17/19 0444  DDIMER 1.15* 0.84*    Cardiac Enzymes No results for input(s): CKMB, TROPONINI, MYOGLOBIN in the last 168 hours.  Invalid input(s): CK ------------------------------------------------------------------------------------------------------------------    Component Value Date/Time   BNP 35.7 04/17/2019  0830    Micro Results No results found for this or any previous visit (from the past 240 hour(s)).  Radiology Reports DG Chest [place] AM  Result Date: 04/12/2019 CLINICAL DATA:  Shortness of breath. EXAM: PORTABLE CHEST 1 VIEW COMPARISON:  04/05/2019.  04/04/2019. FINDINGS: Interim removal of right IJ line. Right PICC line noted with tip over superior vena cava. Heart size stable. Diffuse progressive bilateral pulmonary infiltrates. No pleural effusion. No pneumothorax. IMPRESSION: 1. Interim removal of right IJ line. Right PICC line noted with tip over superior vena cava. 2.  Diffuse progressive bilateral pulmonary infiltrates. Electronically Signed   By: Marcello Moores  Register   On: 04/12/2019 07:14   [place] EKG SITE RITE  Result Date: 04/08/2019 If Site Rite image not attached, placement could not be confirmed due to current cardiac rhythm.  [place] EKG SITE RITE  Result Date: 04/06/2019 If Site Rite image not attached, placement could not be confirmed due to current cardiac rhythm.

## 2019-04-18 LAB — COMPREHENSIVE METABOLIC PANEL WITH GFR
ALT: 70 U/L — ABNORMAL HIGH (ref 0–44)
AST: 46 U/L — ABNORMAL HIGH (ref 15–41)
Albumin: 2.8 g/dL — ABNORMAL LOW (ref 3.5–5.0)
Alkaline Phosphatase: 64 U/L (ref 38–126)
Anion gap: 10 (ref 5–15)
BUN: 43 mg/dL — ABNORMAL HIGH (ref 8–23)
CO2: 28 mmol/L (ref 22–32)
Calcium: 8.4 mg/dL — ABNORMAL LOW (ref 8.9–10.3)
Chloride: 104 mmol/L (ref 98–111)
Creatinine, Ser: 1.07 mg/dL — ABNORMAL HIGH (ref 0.44–1.00)
GFR calc Af Amer: 59 mL/min — ABNORMAL LOW
GFR calc non Af Amer: 51 mL/min — ABNORMAL LOW
Glucose, Bld: 88 mg/dL (ref 70–99)
Potassium: 4 mmol/L (ref 3.5–5.1)
Sodium: 142 mmol/L (ref 135–145)
Total Bilirubin: 1.2 mg/dL (ref 0.3–1.2)
Total Protein: 4.7 g/dL — ABNORMAL LOW (ref 6.5–8.1)

## 2019-04-18 LAB — CBC WITH DIFFERENTIAL/PLATELET
Abs Immature Granulocytes: 0.71 10*3/uL — ABNORMAL HIGH (ref 0.00–0.07)
Basophils Absolute: 0.3 10*3/uL — ABNORMAL HIGH (ref 0.0–0.1)
Basophils Relative: 0 %
Eosinophils Absolute: 0.6 10*3/uL — ABNORMAL HIGH (ref 0.0–0.5)
Eosinophils Relative: 1 %
HCT: 30.2 % — ABNORMAL LOW (ref 36.0–46.0)
Hemoglobin: 9.3 g/dL — ABNORMAL LOW (ref 12.0–15.0)
Immature Granulocytes: 1 %
Lymphocytes Relative: 82 %
Lymphs Abs: 62.3 10*3/uL — ABNORMAL HIGH (ref 0.7–4.0)
MCH: 30.1 pg (ref 26.0–34.0)
MCHC: 30.8 g/dL (ref 30.0–36.0)
MCV: 97.7 fL (ref 80.0–100.0)
Monocytes Absolute: 3.4 10*3/uL — ABNORMAL HIGH (ref 0.1–1.0)
Monocytes Relative: 4 %
Neutro Abs: 9.2 10*3/uL — ABNORMAL HIGH (ref 1.7–7.7)
Neutrophils Relative %: 12 %
Platelets: 160 10*3/uL (ref 150–400)
RBC: 3.09 MIL/uL — ABNORMAL LOW (ref 3.87–5.11)
RDW: 15.9 % — ABNORMAL HIGH (ref 11.5–15.5)
WBC Morphology: ABNORMAL
WBC: 76.5 10*3/uL (ref 4.0–10.5)
nRBC: 0 % (ref 0.0–0.2)

## 2019-04-18 LAB — MAGNESIUM: Magnesium: 1.8 mg/dL (ref 1.7–2.4)

## 2019-04-18 LAB — GLUCOSE, CAPILLARY
Glucose-Capillary: 127 mg/dL — ABNORMAL HIGH (ref 70–99)
Glucose-Capillary: 84 mg/dL (ref 70–99)
Glucose-Capillary: 117 mg/dL — ABNORMAL HIGH (ref 70–99)

## 2019-04-18 LAB — D-DIMER, QUANTITATIVE: D-Dimer, Quant: 1.08 ug{FEU}/mL — ABNORMAL HIGH (ref 0.00–0.50)

## 2019-04-18 LAB — TSH: TSH: 5.912 u[IU]/mL — ABNORMAL HIGH (ref 0.350–4.500)

## 2019-04-18 LAB — C-REACTIVE PROTEIN: CRP: 0.5 mg/dL

## 2019-04-18 LAB — BRAIN NATRIURETIC PEPTIDE: B Natriuretic Peptide: 52.6 pg/mL (ref 0.0–100.0)

## 2019-04-18 MED ORDER — APIXABAN 5 MG PO TABS: 5.0000 mg | ORAL_TABLET | Freq: Two times a day (BID) | ORAL | 0 refills | Status: DC

## 2019-04-18 MED ORDER — METOPROLOL TARTRATE 50 MG PO TABS: 50.0000 mg | ORAL_TABLET | Freq: Two times a day (BID) | ORAL | 0 refills | Status: DC

## 2019-04-18 NOTE — Progress Notes (Signed)
SATURATION QUALIFICATIONS: (This note is used to comply with regulatory documentation for home oxygen)  Patient Saturations on Room Air at Rest = 89%  Patient Saturations on Room Air while Ambulating = 77%  Patient Saturations on 2Liters of oxygen while Ambulating = 83%  Please briefly explain why patient needs home oxygen: pt is walking with she definitely need the oxygen even at rest

## 2019-04-18 NOTE — TOC Initial Note (Addendum)
Transition of Care [facility]) - Initial/Assessment Note    Patient Details  Name: Cynthia Gardner MRN: [id number] Date of Birth: 1945/05/04  Transition of Care [facility]) CM/SW Contact:    Alberteen Sam, [place] Phone Number: [phone] 04/18/2019, 1:26 PM  Clinical Narrative:                   Update: [facility] accepted for home health services.   CSW spoke with patient regarding home health and oxygen needs, no preference as to agencies. CSW spoke with Magda Paganini with [facility], made oxygen referral and called [facility] to deliver portable O2 tank to room for discharge. [facility] reports they will be to home shortly to set up and patient can dc with portable.   CSW called Company secretary for home health PT, OT and RN. Orders are in for [facility]. Spoke with [employer] and she is reviewing chart, CSW will follow up but this should not hold up patient's dc. CSW will call patient's cell phone to confirm if [facility] was to take, patient is agreeable to this and reports her husband is on the way to pick her up once [facility] delivers O2 to room .  Expected Discharge Plan: [place] Barriers to Discharge: No Barriers Identified   Patient Goals and CMS Choice   CMS Medicare.gov Compare Post Acute Care list provided to:: Patient Choice offered to / list presented to : Patient  Expected Discharge Plan and Services Expected Discharge Plan: [place] Choice: [place] arrangements for the past 2 months: Single Family Home Expected Discharge Date: 04/18/19               DME Arranged: Oxygen DME Agency: [place] Date DME Agency Contacted: 04/18/19 Time DME Agency Contacted: [id number] Representative spoke with at DME Agency: Magda Paganini HH Arranged: PT, OT, RN [place] Agency: [facility] Date [place]: 04/18/19 Time HH Agency Contacted: 70 Representative spoke with at [place]: [place]  Prior Living Arrangements/Services Living arrangements for the past 2 months:  [place] Lives with:: Spouse Patient language and need for interpreter reviewed:: Yes Do you feel safe going back to the place where you live?: Yes      Need for Family Participation in Patient Care: Yes (Comment) Care giver support system in place?: Yes (comment)   Criminal Activity/Legal Involvement Pertinent to Current Situation/Hospitalization: No - Comment as needed  Activities of Daily Living Home Assistive Devices/Equipment: Eyeglasses ADL Screening (condition at time of admission) Patient's cognitive ability adequate to safely complete daily activities?: Yes Is the patient deaf or have difficulty hearing?: No Does the patient have difficulty seeing, even when wearing glasses/contacts?: No Does the patient have difficulty concentrating, remembering, or making decisions?: No Patient able to express need for assistance with ADLs?: No Does the patient have difficulty dressing or bathing?: No Independently performs ADLs?: No Communication: Independent Dressing (OT): Needs assistance Is this a change from baseline?: Pre-admission baseline Grooming: Appropriate for developmental age, Needs assistance Is this a change from baseline?: Pre-admission baseline Feeding: Independent Bathing: Independent Toileting: Needs assistance, Appropriate for developmental age Is this a change from baseline?: Pre-admission baseline In/Out Bed: Needs assistance Is this a change from baseline?: Pre-admission baseline Walks in Home: Independent, Needs assistance Is this a change from baseline?: Pre-admission baseline Does the patient have difficulty walking or climbing stairs?: Yes Weakness of Legs: Both Weakness of Arms/Hands: Both  Permission Sought/Granted Permission sought to share  information with : Case Manager, Customer service manager, Family Supports Permission granted to share information with : Yes, Verbal Permission Granted  Share Information with NAME: Juanda Crumble      Permission granted to share info w Relationship: spouse  Permission granted to share info w Contact Information: [phone]  Emotional Assessment Appearance:: Other (Comment Required(unable to assess - remote) Attitude/Demeanor/Rapport: Gracious Affect (typically observed): Calm Orientation: : Oriented to Self, Oriented to Place, Oriented to  Time, Oriented to Situation Alcohol / Substance Use: Not Applicable Psych Involvement: No (comment)  Admission diagnosis:  Acute respiratory failure with hypoxemia ([place]) [J96.01] Patient Active Problem List   Diagnosis Date Noted  . Acute respiratory failure with hypoxemia ([place]) 04/06/2019  . Pneumonia due to COVID-19 virus 04/06/2019  . HTN (hypertension) 04/06/2019  . Hypothyroidism 04/06/2019  . CLL (chronic lymphocytic leukemia) ([place]) 04/06/2019  . CVA (cerebral vascular accident) ([place]) 04/06/2019  . Atrial fibrillation with RVR ([place]) 04/06/2019   PCP:  Rochel Brome, MD Pharmacy:   [id number] [place], [address] [place] [address] Phone: [phone] Fax: [phone]  [facility] [place] [address] [place] [address] Phone: [phone] Fax: [phone]     Social Determinants of Health (SDOH) Interventions    Readmission Risk Interventions No flowsheet data found.

## 2019-04-18 NOTE — Discharge Summary (Signed)
Cynthia Gardner [id number] DOB: 08-Jul-1945 DOA: 04/06/2019  PCP: Rochel Brome, MD  Admit date: 04/06/2019  Discharge date: 04/18/2019  Admitted From: Home   disposition:  Home   Recommendations for Outpatient Follow-up:   Follow up with PCP in 1-2 weeks  PCP Please obtain BMP/CBC, 2 view CXR in 1week,  (see Discharge instructions)   PCP Please follow up on the following pending results: Needs outpatient cardiology follow-up echocardiogram.  Also needs repeat CBC, CMP in 10 to 14 days.   Home Health: PT,RN   Equipment/Devices: Walker, 2lit o2 PRN  Consultations: None  Discharge Condition: Stable    CODE STATUS: Full    Diet Recommendation: Heart Healthy   CC - SOB   Brief history of present illness from the day of admission and additional interim summary    Patient is a 74 y.o. female with PMHx of CLL, hypothyroidism, remote history of CVA without any focal deficits-who was diagnosed with COVID-19 on 12/30 (per notes from [facility] with shortness of breath-she was found to be profoundly hypoxemic requiring 100% nonrebreather mask along with A. fib with RVR in the setting of COVID-19 pneumonia.  She was then transferred to the hospitalist service at [facility] on 1/6.  She was treated with steroids/remdesivir/Actemra-with slow gradual improvement-over the past several days-she has been stable on just 3 L of oxygen.  Plans are to discharge to SNF when bed available.  Please see below for further details.                                                                 Hospital Course   Acute Hypoxic Resp Failure due to Covid 19 Viral pneumonia: She had severe disease and was treated appropriately with IV steroids, remdesivir and Actemra, she is much improved now and currently down to 2 L  nasal cannula oxygen upon ambulation, symptom-free, has now finished her remdesivir and steroid treatment, at rest she is stable on room air upon ambulation requires 1 to 2 L of oxygen, will be discharged home with as needed oxygen with home health RN and PT.  She is completely symptom-free.   SpO2: 94 % O2 Flow Rate (L/min): 2 L/min  Recent Labs  Lab 04/13/19 0357 04/13/19 0357 04/14/19 0500 04/15/19 0530 04/16/19 0442 04/17/19 0444 04/17/19 0445 04/17/19 0830 04/18/19 0530  CRP 0.8   < > <0.5 0.6 0.5  --  0.6  --  0.5  DDIMER 1.40*   < > 1.12* 1.20* 1.15* 0.84*  --   --  1.08*  FERRITIN 1,248*  --  1,162* 1,313* 1,400*  --  876*  --   --   BNP  --   --   --   --   --   --   --  35.7  52.6   < > = values in this interval not displayed.    Hepatic Function Latest Ref Rng & Units 04/18/2019 04/17/2019 04/16/2019  Total Protein 6.5 - 8.1 g/dL 4.7(L) 4.6(L) 5.2(L)  Albumin 3.5 - 5.0 g/dL 2.8(L) 2.8(L) 3.2(L)  AST 15 - 41 U/L 46(H) 47(H) 62(H)  ALT 0 - 44 U/L 70(H) 69(H) 85(H)  Alk Phosphatase 38 - 126 U/L 64 64 77  Total Bilirubin 0.3 - 1.2 mg/dL 1.2 1.1 1.1    Atrial fibrillation with RVR with [place] vas 2 score of greater than 3: In rate control on Lopressor and Eliquis combination.  Outpatient cardiology follow-up and echocardiogram recommended.  Minimally elevated troponin: Trend is flat per documentation from [facility]-likely secondary demand ischemia. Note-04/04/18 TTE: EF 60-65%-with no regional wall motion abnormality (done at [facility]), recommend outpatient cardiology follow-up and echocardiogram.  Hyperkalemia:  Resolved after Kayexalate.  AKI: Improving-likely hemodynamically mediated.  Follow.  Transaminitis: Mild transaminitis continues-this is secondary to COVID-19.  Follow.  Hypothyroidism: On home dose Synthroid.  PCP to monitor TSH, free T4 and T3.  History of CVA: On anticoagulation given new onset A. fib.  Plavix has been discontinued-as there is no indication  for Plavix at this point-especially now that she is on anticoagulation.    History of CLL: Per documentation from[facility]-baseline WBC count around 20-25,000-currently WBC count close to 100,000-likely secondary to acute illness/steroid use.    Previous MD spoke with Dr. Benay Spice over the phone on 1/10-recommends supportive care-with steroids-white count can even go higher per oncology.  PCP to repeat CBC in 7 to 10 days post discharge.    Discharge diagnosis     Principal Problem:   Acute respiratory failure with hypoxemia (HCC) Active Problems:   Pneumonia due to COVID-19 virus   HTN (hypertension)   Hypothyroidism   CLL (chronic lymphocytic leukemia) (HCC)   CVA (cerebral vascular accident) ([place])   Atrial fibrillation with RVR [facility])    Discharge instructions    Discharge Instructions    Amb referral to AFIB Clinic   Complete by: As directed    Diet - low sodium heart healthy   Complete by: As directed    Discharge instructions   Complete by: As directed    Follow with Primary MD Cox, Kirsten, MD in 7 days   Get CBC, CMP, 2 view Chest X ray -  checked next visit within 1 week by Primary MD   Activity: As tolerated with Full fall precautions use walker/cane & assistance as needed  Disposition Home   Diet: Heart Healthy   Special Instructions: If you have smoked or chewed Tobacco  in the last 2 yrs please stop smoking, stop any regular Alcohol  and or any Recreational drug use.  On your next visit with your primary care physician please Get Medicines reviewed and adjusted.  Please request your Prim.MD to go over all Hospital Tests and Procedure/Radiological results at the follow up, please get all Hospital records sent to your Prim MD by signing hospital release before you go home.  If you experience worsening of your admission symptoms, develop shortness of breath, life threatening emergency, suicidal or homicidal thoughts you must seek medical attention immediately by  calling 911 or calling your MD immediately  if symptoms less severe.  You Must read complete instructions/literature along with all the possible adverse reactions/side effects for all the Medicines you take and that have been prescribed to you. Take any new Medicines after you have completely understood and accpet all  the possible adverse reactions/side effects.   Do not drive, operate heavy machinery, perform activities at heights, swimming or participation in water activities or provide baby sitting services if your were admitted for syncope or siezures until you have seen by Primary MD or a Neurologist and advised to do so again.  Do not drive when taking Pain medications.  Do not take more than prescribed Pain, Sleep and Anxiety Medications  Wear Seat belts while driving.   Please note  You were cared for by a hospitalist during your hospital stay. If you have any questions about your discharge medications or the care you received while you were in the hospital after you are discharged, you can call the unit and asked to speak with the hospitalist on call if the hospitalist that took care of you is not available. Once you are discharged, your primary care physician will handle any further medical issues. Please note that NO REFILLS for any discharge medications will be authorized once you are discharged, as it is imperative that you return to your primary care physician (or establish a relationship with a primary care physician if you do not have one) for your aftercare needs so that they can reassess your need for medications and monitor your lab values.   Increase activity slowly   Complete by: As directed       Discharge Medications   Allergies as of 04/18/2019   No Known Allergies     Medication List    STOP taking these medications   clopidogrel 75 MG tablet Commonly known as: PLAVIX     TAKE these medications   apixaban 5 MG Tabs tablet Commonly known as: ELIQUIS Take 1  tablet (5 mg total) by mouth 2 (two) times daily.   cetirizine 10 MG tablet Commonly known as: ZYRTEC Take 10 mg by mouth at bedtime.   chlorthalidone 25 MG tablet Commonly known as: HYGROTON Take 25 mg by mouth daily.   cloNIDine 0.3 MG tablet Commonly known as: CATAPRES Take 0.3 mg by mouth 2 (two) times daily.   fluticasone 50 MCG/ACT nasal spray Commonly known as: FLONASE Place 2 sprays into both nostrils at bedtime.   folic acid 1 MG tablet Commonly known as: FOLVITE Take 1 mg by mouth daily.   hydroxypropyl methylcellulose / hypromellose 2.5 % ophthalmic solution Commonly known as: ISOPTO TEARS / GONIOVISC Place 1 drop into both eyes 4 (four) times daily as needed for dry eyes.   levothyroxine 50 MCG tablet Commonly known as: SYNTHROID Take 50 mcg by mouth daily.   metoprolol tartrate 50 MG tablet Commonly known as: LOPRESSOR Take 1 tablet (50 mg total) by mouth 2 (two) times daily.   potassium chloride 10 MEQ CR capsule Commonly known as: MICRO-K Take 40 mEq by mouth daily.   VITAMIN D3 PO Take 1 tablet by mouth daily.   VITAMIN E PO Take 1 capsule by mouth daily.   ZINC PO Take 1 tablet by mouth daily.            Durable Medical Equipment  (From admission, onward)         Start     Ordered   04/13/19 1545  For home use only DME oxygen  Once    Question Answer Comment  Length of Need 6 Months   Mode or (Route) Nasal cannula   Liters per Minute 2   Frequency Continuous (stationary and portable oxygen unit needed)   Oxygen conserving device Yes   Oxygen  delivery system Gas      04/13/19 1544          Follow-up Information    Cox, Kirsten, MD. Schedule an appointment as soon as possible for a visit in 1 week(s).   Specialties: Family Medicine, Interventional Cardiology, Radiology, Anesthesiology Contact information: [address] [place] [address] [phone]        Jerline Pain, MD. Schedule an appointment as  soon as possible for a visit in 2 week(s).   Specialty: Cardiology Why: Afib Contact information: [address] [place] [address] [place] [address] [phone]           Major procedures and Radiology Reports - PLEASE review detailed and final reports thoroughly  -       DG Chest [facility] AM  Result Date: 04/12/2019 CLINICAL DATA:  Shortness of breath. EXAM: PORTABLE CHEST 1 VIEW COMPARISON:  04/05/2019.  04/04/2019. FINDINGS: Interim removal of right IJ line. Right PICC line noted with tip over superior vena cava. Heart size stable. Diffuse progressive bilateral pulmonary infiltrates. No pleural effusion. No pneumothorax. IMPRESSION: 1. Interim removal of right IJ line. Right PICC line noted with tip over superior vena cava. 2.  Diffuse progressive bilateral pulmonary infiltrates. Electronically Signed   By: Marcello Moores  Register   On: 04/12/2019 07:14   [place] EKG SITE RITE  Result Date: 04/08/2019 If Site Rite image not attached, placement could not be confirmed due to current cardiac rhythm.  [place] EKG SITE RITE  Result Date: 04/06/2019 If Site Rite image not attached, placement could not be confirmed due to current cardiac rhythm.   Micro Results     No results found for this or any previous visit (from the past 240 hour(s)).  Today   Subjective    Cynthia Gardner today has no headache,no chest abdominal pain,no new weakness tingling or numbness, feels much better wants to go home today.     Objective   Blood pressure 122/60, pulse 85, temperature 97.6 F (36.4 C), temperature source Oral, resp. rate (!) 24, height _0  (1.676 m), weight 81.6 kg, SpO2 94 %.   Intake/Output Summary (Last 24 hours) at 04/18/2019 1113 Last data filed at 04/17/2019 1800 Gross per 24 hour  Intake 1131.59 ml  Output --  Net 1131.59 ml    Exam  Awake Alert,  No new F.N deficits, Normal affect [place].AT,PERRAL Supple Neck,No JVD, No cervical lymphadenopathy appriciated.  Symmetrical Chest wall  movement, Good air movement bilaterally, CTAB RRR,No Gallops,Rubs or new Murmurs, No Parasternal Heave +ve B.Sounds, Abd Soft, Non tender, No organomegaly appriciated, No rebound -guarding or rigidity. No Cyanosis, Clubbing or edema, No new Rash or bruise   Data Review   CBC w Diff:  Lab Results  Component Value Date   WBC 76.5 (HH) 04/18/2019   HGB 9.3 (L) 04/18/2019   HCT 30.2 (L) 04/18/2019   PLT 160 04/18/2019   LYMPHOPCT 82 04/18/2019   MONOPCT 4 04/18/2019   EOSPCT 1 04/18/2019   BASOPCT 0 04/18/2019    CMP:  Lab Results  Component Value Date   NA 142 04/18/2019   K 4.0 04/18/2019   CL 104 04/18/2019   CO2 28 04/18/2019   BUN 43 (H) 04/18/2019   CREATININE 1.07 (H) 04/18/2019   PROT 4.7 (L) 04/18/2019   ALBUMIN 2.8 (L) 04/18/2019   BILITOT 1.2 04/18/2019   ALKPHOS 64 04/18/2019   AST 46 (H) 04/18/2019   ALT 70 (H) 04/18/2019  .  Total Time in preparing paper work, data evaluation and todays exam - 75 minutes  Lala Lund M.D on 04/18/2019 at [place]  [phone]

## 2019-04-18 NOTE — Plan of Care (Signed)
Pt has been discharged home all education has been done she is aware of her new medication Elquis and she will not be taking Plavix anymore,. Education on meds and follow up visit has been discuss. Pt got picked up by her daughter in law. Pt took all her belonging with her and she has home oxygen set up and she took an oxygen tank with her. Pt also has a finger probe for Oxygen saturation monitoring.

## 2019-04-18 NOTE — Progress Notes (Signed)
Physical Therapy Treatment Patient Details Name: Cynthia Gardner MRN: [id number] DOB: Jan 19, 1946 Today's Date: 04/18/2019    History of Present Illness Patient is a 74 y.o. female with PMHx of CLL, hypothyroidism, remote history of CVA without any focal deficits-who was diagnosed with COVID-19 on 12/30 (per notes from [facility] with shortness of breath-she was found to be profoundly hypoxemic requiring 100% nonrebreather mask along with A. fib with RVR in the setting of COVID-19 pneumonia    PT Comments    Pt was able to ambulate twice around the room with supervision using the rollator (4 wheeled RW).  She needed 4 L O2 [place] to ambulate and maintain sats >88%.  She does recover well in < 3 mins.  Education re: O2 use at home and O2 weaning presented, but will need to be reinforced by [facility] therapy.  PT will continue to follow acutely for safe mobility progression   Follow Up Recommendations  Home health PT(HHPT and HHOT)     Equipment Recommendations  Other (comment)(home O2)    Recommendations for Other Services   NA     Precautions / Restrictions Precautions Precautions: Fall Precaution Comments: 2-4 L    Mobility  Bed Mobility               General bed mobility comments: Pt was OOB in the recliner chair.   Transfers Overall transfer level: Needs assistance Equipment used: 4-wheeled walker Transfers: Sit to/from [facility] Sit to Stand: Supervision Stand pivot transfers: Supervision       General transfer comment: Supervision for safety and line management.   Ambulation/Gait Ambulation/Gait assistance: Supervision Gait Distance (Feet): 20 Feet(x2) Assistive device: 4-wheeled walker Gait Pattern/deviations: Step-through pattern;Shuffle;Trunk flexed Gait velocity: decreased Gait velocity interpretation: 1.31 - 2.62 ft/sec, indicative of limited community ambulator General Gait Details: Pt with slow, shuffling gait, cues for upright  posture.  Walked twice.  Once on 2 L which she was unable to maintain sats but in the low 80s and once on 4 L which she maintained sats 88% or higher.  2/4 DOE and very short <3 mins recovery period.  O2 monitored by earlobe pedi probe.           Balance Overall balance assessment: Needs assistance Sitting-balance support: Feet supported;No upper extremity supported Sitting balance-Leahy Scale: Good     Standing balance support: Bilateral upper extremity supported;No upper extremity supported Standing balance-Leahy Scale: Fair Standing balance comment: close supervision, transferred to [facility] with supervision, but used hands for support and balance during transfer.                             Cognition Arousal/Alertness: Awake/alert Behavior During Therapy: WFL for tasks assessed/performed Overall Cognitive Status: Within Functional Limits for tasks assessed                                 General Comments: Not specifically tested, conversation normal.       Exercises Other Exercises Other Exercises: incentive spirometer and flutter valve x 10 each with cues on IS to slow inhale (max 750 mL).     General Comments General comments (skin integrity, edema, etc.): See RN ambulatory sat note, but looks like pt would benefit from 2 L O2 [place] at rest and 4 L O2 [place] during gait and mobility.  Pt aware of this and educated on self monitoring  with finger pulse ox (knowing she needs to get her finger nail polish removed).       Pertinent Vitals/Pain Pain Assessment: No/denies pain           PT Goals (current goals can now be found in the care plan section) Acute Rehab PT Goals Patient Stated Goal: to get better and go home Progress towards PT goals: Progressing toward goals    Frequency    Min 3X/week      PT Plan Discharge plan needs to be updated       AM-PAC PT "6 Clicks" Mobility   Outcome Measure  Help needed turning from your back to your  side while in a flat bed without using bedrails?: None Help needed moving from lying on your back to sitting on the side of a flat bed without using bedrails?: None Help needed moving to and from a bed to a chair (including a wheelchair)?: None Help needed standing up from a chair using your arms (e.g., wheelchair or bedside chair)?: None Help needed to walk in hospital room?: None Help needed climbing 3-5 steps with a railing? : A Little 6 Click Score: 23    End of Session Equipment Utilized During Treatment: Oxygen(2-4L) Activity Tolerance: Patient limited by fatigue Patient left: in chair;with call bell/phone within reach;with chair alarm set Nurse Communication: Mobility status PT Visit Diagnosis: Unsteadiness on feet (R26.81);History of falling (Z91.81);Difficulty in walking, not elsewhere classified (R26.2)     Time: [id number] PT Time Calculation (min) (ACUTE ONLY): 31 min  Charges:  $Gait Training: 8-22 mins $Therapeutic Activity: 8-22 mins          Cynthia Gardner, PT, DPT  Acute Rehabilitation [phone] pager #[phone] office  @ [place]: [phone]             04/18/2019, 2:18 PM

## 2019-04-18 NOTE — Care Management Important Message (Addendum)
Important Message  Patient Details  Name: Cynthia Gardner MRN: [id number] Date of Birth: 06/10/1945   Medicare Important Message Given:  Yes - Important Message mailed due to current National Emergency  Verbal consent obtained due to current National Emergency  Relationship to patient: Child Contact Name: Ledon Snare Call Date: 04/18/19  Time: 1209 Phone: [phone] Outcome: Spoke with contact Important Message mailed to: Patient address on file    Delorse Lek 04/18/2019, 12:10 PM

## 2019-04-18 NOTE — Progress Notes (Signed)
Spoke with Social Worker [place] and she said that pt is good to go Oxygen will be delivered at the house after d/c.

## 2019-04-19 ENCOUNTER — Other Ambulatory Visit: Payer: Self-pay

## 2019-04-19 DIAGNOSIS — U071 COVID-19: Secondary | ICD-10-CM

## 2019-04-19 NOTE — Consult Note (Signed)
Patient screened post for post hospital follow up needs.  Patient could benefit from EMMI pneumonia due to risk for unplanned readmissions in the [facility] [ACO].  Natividad Brood, RN BSN [facility] Liaison  [phone] business mobile phone Toll free office [phone]  Fax number: [phone] [email or web address]

## 2019-04-21 DIAGNOSIS — I10 Essential (primary) hypertension: Secondary | ICD-10-CM | POA: Diagnosis not present

## 2019-04-21 DIAGNOSIS — Z8701 Personal history of pneumonia (recurrent): Secondary | ICD-10-CM | POA: Diagnosis not present

## 2019-04-21 DIAGNOSIS — Z856 Personal history of leukemia: Secondary | ICD-10-CM | POA: Diagnosis not present

## 2019-04-21 DIAGNOSIS — Z8673 Personal history of transient ischemic attack (TIA), and cerebral infarction without residual deficits: Secondary | ICD-10-CM | POA: Diagnosis not present

## 2019-04-21 DIAGNOSIS — Z87891 Personal history of nicotine dependence: Secondary | ICD-10-CM | POA: Diagnosis not present

## 2019-04-21 DIAGNOSIS — E039 Hypothyroidism, unspecified: Secondary | ICD-10-CM | POA: Diagnosis not present

## 2019-04-21 DIAGNOSIS — I4891 Unspecified atrial fibrillation: Secondary | ICD-10-CM | POA: Diagnosis not present

## 2019-04-21 DIAGNOSIS — Z7901 Long term (current) use of anticoagulants: Secondary | ICD-10-CM | POA: Diagnosis not present

## 2019-04-21 DIAGNOSIS — Z9981 Dependence on supplemental oxygen: Secondary | ICD-10-CM | POA: Diagnosis not present

## 2019-04-21 DIAGNOSIS — U071 COVID-19: Secondary | ICD-10-CM | POA: Diagnosis not present

## 2019-04-22 DIAGNOSIS — Z9981 Dependence on supplemental oxygen: Secondary | ICD-10-CM | POA: Diagnosis not present

## 2019-04-22 DIAGNOSIS — Z7901 Long term (current) use of anticoagulants: Secondary | ICD-10-CM | POA: Diagnosis not present

## 2019-04-22 DIAGNOSIS — Z8701 Personal history of pneumonia (recurrent): Secondary | ICD-10-CM | POA: Diagnosis not present

## 2019-04-22 DIAGNOSIS — E039 Hypothyroidism, unspecified: Secondary | ICD-10-CM | POA: Diagnosis not present

## 2019-04-22 DIAGNOSIS — Z8673 Personal history of transient ischemic attack (TIA), and cerebral infarction without residual deficits: Secondary | ICD-10-CM | POA: Diagnosis not present

## 2019-04-22 DIAGNOSIS — I10 Essential (primary) hypertension: Secondary | ICD-10-CM | POA: Diagnosis not present

## 2019-04-22 DIAGNOSIS — Z87891 Personal history of nicotine dependence: Secondary | ICD-10-CM | POA: Diagnosis not present

## 2019-04-22 DIAGNOSIS — U071 COVID-19: Secondary | ICD-10-CM | POA: Diagnosis not present

## 2019-04-22 DIAGNOSIS — Z856 Personal history of leukemia: Secondary | ICD-10-CM | POA: Diagnosis not present

## 2019-04-22 DIAGNOSIS — I4891 Unspecified atrial fibrillation: Secondary | ICD-10-CM | POA: Diagnosis not present

## 2019-04-25 DIAGNOSIS — Z8701 Personal history of pneumonia (recurrent): Secondary | ICD-10-CM | POA: Diagnosis not present

## 2019-04-25 DIAGNOSIS — Z7901 Long term (current) use of anticoagulants: Secondary | ICD-10-CM | POA: Diagnosis not present

## 2019-04-25 DIAGNOSIS — Z9981 Dependence on supplemental oxygen: Secondary | ICD-10-CM | POA: Diagnosis not present

## 2019-04-25 DIAGNOSIS — J9601 Acute respiratory failure with hypoxia: Secondary | ICD-10-CM | POA: Diagnosis not present

## 2019-04-25 DIAGNOSIS — C9110 Chronic lymphocytic leukemia of B-cell type not having achieved remission: Secondary | ICD-10-CM | POA: Diagnosis not present

## 2019-04-25 DIAGNOSIS — N179 Acute kidney failure, unspecified: Secondary | ICD-10-CM | POA: Diagnosis not present

## 2019-04-25 DIAGNOSIS — J1282 Pneumonia due to coronavirus disease 2019: Secondary | ICD-10-CM | POA: Diagnosis not present

## 2019-04-25 DIAGNOSIS — E039 Hypothyroidism, unspecified: Secondary | ICD-10-CM | POA: Diagnosis not present

## 2019-04-25 DIAGNOSIS — Z87891 Personal history of nicotine dependence: Secondary | ICD-10-CM | POA: Diagnosis not present

## 2019-04-25 DIAGNOSIS — Z856 Personal history of leukemia: Secondary | ICD-10-CM | POA: Diagnosis not present

## 2019-04-25 DIAGNOSIS — U071 COVID-19: Secondary | ICD-10-CM | POA: Diagnosis not present

## 2019-04-25 DIAGNOSIS — Z8673 Personal history of transient ischemic attack (TIA), and cerebral infarction without residual deficits: Secondary | ICD-10-CM | POA: Diagnosis not present

## 2019-04-25 DIAGNOSIS — I4891 Unspecified atrial fibrillation: Secondary | ICD-10-CM | POA: Diagnosis not present

## 2019-04-25 DIAGNOSIS — I10 Essential (primary) hypertension: Secondary | ICD-10-CM | POA: Diagnosis not present

## 2019-04-25 DIAGNOSIS — E875 Hyperkalemia: Secondary | ICD-10-CM | POA: Diagnosis not present

## 2019-04-26 DIAGNOSIS — I4891 Unspecified atrial fibrillation: Secondary | ICD-10-CM | POA: Diagnosis not present

## 2019-04-26 DIAGNOSIS — Z7901 Long term (current) use of anticoagulants: Secondary | ICD-10-CM | POA: Diagnosis not present

## 2019-04-26 DIAGNOSIS — I10 Essential (primary) hypertension: Secondary | ICD-10-CM | POA: Diagnosis not present

## 2019-04-26 DIAGNOSIS — Z8701 Personal history of pneumonia (recurrent): Secondary | ICD-10-CM | POA: Diagnosis not present

## 2019-04-26 DIAGNOSIS — Z8673 Personal history of transient ischemic attack (TIA), and cerebral infarction without residual deficits: Secondary | ICD-10-CM | POA: Diagnosis not present

## 2019-04-26 DIAGNOSIS — Z9981 Dependence on supplemental oxygen: Secondary | ICD-10-CM | POA: Diagnosis not present

## 2019-04-26 DIAGNOSIS — Z87891 Personal history of nicotine dependence: Secondary | ICD-10-CM | POA: Diagnosis not present

## 2019-04-26 DIAGNOSIS — U071 COVID-19: Secondary | ICD-10-CM | POA: Diagnosis not present

## 2019-04-26 DIAGNOSIS — E039 Hypothyroidism, unspecified: Secondary | ICD-10-CM | POA: Diagnosis not present

## 2019-04-26 DIAGNOSIS — Z856 Personal history of leukemia: Secondary | ICD-10-CM | POA: Diagnosis not present

## 2019-04-28 DIAGNOSIS — Z856 Personal history of leukemia: Secondary | ICD-10-CM | POA: Diagnosis not present

## 2019-04-28 DIAGNOSIS — Z8701 Personal history of pneumonia (recurrent): Secondary | ICD-10-CM | POA: Diagnosis not present

## 2019-04-28 DIAGNOSIS — Z87891 Personal history of nicotine dependence: Secondary | ICD-10-CM | POA: Diagnosis not present

## 2019-04-28 DIAGNOSIS — I10 Essential (primary) hypertension: Secondary | ICD-10-CM | POA: Diagnosis not present

## 2019-04-28 DIAGNOSIS — U071 COVID-19: Secondary | ICD-10-CM | POA: Diagnosis not present

## 2019-04-28 DIAGNOSIS — I4891 Unspecified atrial fibrillation: Secondary | ICD-10-CM | POA: Diagnosis not present

## 2019-04-28 DIAGNOSIS — Z8673 Personal history of transient ischemic attack (TIA), and cerebral infarction without residual deficits: Secondary | ICD-10-CM | POA: Diagnosis not present

## 2019-04-28 DIAGNOSIS — Z7901 Long term (current) use of anticoagulants: Secondary | ICD-10-CM | POA: Diagnosis not present

## 2019-04-28 DIAGNOSIS — E039 Hypothyroidism, unspecified: Secondary | ICD-10-CM | POA: Diagnosis not present

## 2019-04-28 DIAGNOSIS — Z9981 Dependence on supplemental oxygen: Secondary | ICD-10-CM | POA: Diagnosis not present

## 2019-04-29 DIAGNOSIS — I10 Essential (primary) hypertension: Secondary | ICD-10-CM | POA: Diagnosis not present

## 2019-04-29 DIAGNOSIS — Z7901 Long term (current) use of anticoagulants: Secondary | ICD-10-CM | POA: Diagnosis not present

## 2019-04-29 DIAGNOSIS — E039 Hypothyroidism, unspecified: Secondary | ICD-10-CM | POA: Diagnosis not present

## 2019-04-29 DIAGNOSIS — Z8701 Personal history of pneumonia (recurrent): Secondary | ICD-10-CM | POA: Diagnosis not present

## 2019-04-29 DIAGNOSIS — I4891 Unspecified atrial fibrillation: Secondary | ICD-10-CM | POA: Diagnosis not present

## 2019-04-29 DIAGNOSIS — Z87891 Personal history of nicotine dependence: Secondary | ICD-10-CM | POA: Diagnosis not present

## 2019-04-29 DIAGNOSIS — U071 COVID-19: Secondary | ICD-10-CM | POA: Diagnosis not present

## 2019-04-29 DIAGNOSIS — Z8673 Personal history of transient ischemic attack (TIA), and cerebral infarction without residual deficits: Secondary | ICD-10-CM | POA: Diagnosis not present

## 2019-04-29 DIAGNOSIS — Z856 Personal history of leukemia: Secondary | ICD-10-CM | POA: Diagnosis not present

## 2019-04-29 DIAGNOSIS — Z9981 Dependence on supplemental oxygen: Secondary | ICD-10-CM | POA: Diagnosis not present

## 2019-05-02 ENCOUNTER — Other Ambulatory Visit: Payer: Self-pay

## 2019-05-02 DIAGNOSIS — Z8701 Personal history of pneumonia (recurrent): Secondary | ICD-10-CM | POA: Diagnosis not present

## 2019-05-02 DIAGNOSIS — E039 Hypothyroidism, unspecified: Secondary | ICD-10-CM | POA: Diagnosis not present

## 2019-05-02 DIAGNOSIS — Z7901 Long term (current) use of anticoagulants: Secondary | ICD-10-CM | POA: Diagnosis not present

## 2019-05-02 DIAGNOSIS — Z9981 Dependence on supplemental oxygen: Secondary | ICD-10-CM | POA: Diagnosis not present

## 2019-05-02 DIAGNOSIS — Z8673 Personal history of transient ischemic attack (TIA), and cerebral infarction without residual deficits: Secondary | ICD-10-CM | POA: Diagnosis not present

## 2019-05-02 DIAGNOSIS — Z87891 Personal history of nicotine dependence: Secondary | ICD-10-CM | POA: Diagnosis not present

## 2019-05-02 DIAGNOSIS — I4891 Unspecified atrial fibrillation: Secondary | ICD-10-CM | POA: Diagnosis not present

## 2019-05-02 DIAGNOSIS — Z856 Personal history of leukemia: Secondary | ICD-10-CM | POA: Diagnosis not present

## 2019-05-02 DIAGNOSIS — U071 COVID-19: Secondary | ICD-10-CM | POA: Diagnosis not present

## 2019-05-02 DIAGNOSIS — I10 Essential (primary) hypertension: Secondary | ICD-10-CM | POA: Diagnosis not present

## 2019-05-02 MED ORDER — LEVOTHYROXINE SODIUM 50 MCG PO TABS: 50.0000 ug | ORAL_TABLET | Freq: Every day | ORAL | 0 refills | Status: DC

## 2019-05-03 DIAGNOSIS — Z87891 Personal history of nicotine dependence: Secondary | ICD-10-CM | POA: Diagnosis not present

## 2019-05-03 DIAGNOSIS — Z8673 Personal history of transient ischemic attack (TIA), and cerebral infarction without residual deficits: Secondary | ICD-10-CM | POA: Diagnosis not present

## 2019-05-03 DIAGNOSIS — I10 Essential (primary) hypertension: Secondary | ICD-10-CM | POA: Diagnosis not present

## 2019-05-03 DIAGNOSIS — Z8701 Personal history of pneumonia (recurrent): Secondary | ICD-10-CM | POA: Diagnosis not present

## 2019-05-03 DIAGNOSIS — I4891 Unspecified atrial fibrillation: Secondary | ICD-10-CM | POA: Diagnosis not present

## 2019-05-03 DIAGNOSIS — Z856 Personal history of leukemia: Secondary | ICD-10-CM | POA: Diagnosis not present

## 2019-05-03 DIAGNOSIS — Z7901 Long term (current) use of anticoagulants: Secondary | ICD-10-CM | POA: Diagnosis not present

## 2019-05-03 DIAGNOSIS — U071 COVID-19: Secondary | ICD-10-CM | POA: Diagnosis not present

## 2019-05-03 DIAGNOSIS — E039 Hypothyroidism, unspecified: Secondary | ICD-10-CM | POA: Diagnosis not present

## 2019-05-03 DIAGNOSIS — Z9981 Dependence on supplemental oxygen: Secondary | ICD-10-CM | POA: Diagnosis not present

## 2019-05-04 DIAGNOSIS — Z9981 Dependence on supplemental oxygen: Secondary | ICD-10-CM | POA: Diagnosis not present

## 2019-05-04 DIAGNOSIS — I10 Essential (primary) hypertension: Secondary | ICD-10-CM | POA: Diagnosis not present

## 2019-05-04 DIAGNOSIS — Z856 Personal history of leukemia: Secondary | ICD-10-CM | POA: Diagnosis not present

## 2019-05-04 DIAGNOSIS — Z87891 Personal history of nicotine dependence: Secondary | ICD-10-CM | POA: Diagnosis not present

## 2019-05-04 DIAGNOSIS — I4891 Unspecified atrial fibrillation: Secondary | ICD-10-CM | POA: Diagnosis not present

## 2019-05-04 DIAGNOSIS — U071 COVID-19: Secondary | ICD-10-CM | POA: Diagnosis not present

## 2019-05-04 DIAGNOSIS — E039 Hypothyroidism, unspecified: Secondary | ICD-10-CM | POA: Diagnosis not present

## 2019-05-04 DIAGNOSIS — Z7901 Long term (current) use of anticoagulants: Secondary | ICD-10-CM | POA: Diagnosis not present

## 2019-05-04 DIAGNOSIS — Z8701 Personal history of pneumonia (recurrent): Secondary | ICD-10-CM | POA: Diagnosis not present

## 2019-05-04 DIAGNOSIS — Z8673 Personal history of transient ischemic attack (TIA), and cerebral infarction without residual deficits: Secondary | ICD-10-CM | POA: Diagnosis not present

## 2019-05-05 DIAGNOSIS — Z856 Personal history of leukemia: Secondary | ICD-10-CM | POA: Diagnosis not present

## 2019-05-05 DIAGNOSIS — U071 COVID-19: Secondary | ICD-10-CM | POA: Diagnosis not present

## 2019-05-05 DIAGNOSIS — Z87891 Personal history of nicotine dependence: Secondary | ICD-10-CM | POA: Diagnosis not present

## 2019-05-05 DIAGNOSIS — I4891 Unspecified atrial fibrillation: Secondary | ICD-10-CM | POA: Diagnosis not present

## 2019-05-05 DIAGNOSIS — Z8701 Personal history of pneumonia (recurrent): Secondary | ICD-10-CM | POA: Diagnosis not present

## 2019-05-05 DIAGNOSIS — Z7901 Long term (current) use of anticoagulants: Secondary | ICD-10-CM | POA: Diagnosis not present

## 2019-05-05 DIAGNOSIS — I10 Essential (primary) hypertension: Secondary | ICD-10-CM | POA: Diagnosis not present

## 2019-05-05 DIAGNOSIS — E039 Hypothyroidism, unspecified: Secondary | ICD-10-CM | POA: Diagnosis not present

## 2019-05-05 DIAGNOSIS — Z9981 Dependence on supplemental oxygen: Secondary | ICD-10-CM | POA: Diagnosis not present

## 2019-05-05 DIAGNOSIS — Z8673 Personal history of transient ischemic attack (TIA), and cerebral infarction without residual deficits: Secondary | ICD-10-CM | POA: Diagnosis not present

## 2019-05-06 ENCOUNTER — Other Ambulatory Visit: Payer: Self-pay

## 2019-05-06 NOTE — Patient Outreach (Signed)
Red Emmi:  Reason for alert:  Wheezing more: yes Swelling in feet/hand : yes  Placed call to patient and explained reason for call.  Reviewed emmi alerts. Patient reports that she has not had any wheezing and that she has not had any swelling. Reports she noted that the automated system recorded her messages wrong.   Reviewed follow up call with MD and patient has seen primary MD since hospital discharge.  PLAN:: will close case as no needs.  Tomasa Rand, RN, BSN, CEN [facility] [employer] [phone]

## 2019-05-09 ENCOUNTER — Other Ambulatory Visit: Payer: Self-pay | Admitting: Family Medicine

## 2019-05-09 ENCOUNTER — Other Ambulatory Visit: Payer: Self-pay

## 2019-05-09 DIAGNOSIS — Z87891 Personal history of nicotine dependence: Secondary | ICD-10-CM | POA: Diagnosis not present

## 2019-05-09 DIAGNOSIS — Z8673 Personal history of transient ischemic attack (TIA), and cerebral infarction without residual deficits: Secondary | ICD-10-CM | POA: Diagnosis not present

## 2019-05-09 DIAGNOSIS — E039 Hypothyroidism, unspecified: Secondary | ICD-10-CM | POA: Diagnosis not present

## 2019-05-09 DIAGNOSIS — I10 Essential (primary) hypertension: Secondary | ICD-10-CM | POA: Diagnosis not present

## 2019-05-09 DIAGNOSIS — Z9981 Dependence on supplemental oxygen: Secondary | ICD-10-CM | POA: Diagnosis not present

## 2019-05-09 DIAGNOSIS — Z856 Personal history of leukemia: Secondary | ICD-10-CM | POA: Diagnosis not present

## 2019-05-09 DIAGNOSIS — I4891 Unspecified atrial fibrillation: Secondary | ICD-10-CM | POA: Diagnosis not present

## 2019-05-09 DIAGNOSIS — Z8701 Personal history of pneumonia (recurrent): Secondary | ICD-10-CM | POA: Diagnosis not present

## 2019-05-09 DIAGNOSIS — Z7901 Long term (current) use of anticoagulants: Secondary | ICD-10-CM | POA: Diagnosis not present

## 2019-05-09 DIAGNOSIS — U071 COVID-19: Secondary | ICD-10-CM | POA: Diagnosis not present

## 2019-05-09 NOTE — Patient Outreach (Signed)
Red emmi referral: Date of red emmi call:  05/06/2019 Reason for alert:  Swelling in hands, feet and changes in weight- yes    Reviewed EMR. Placed call to patient with no answer. Left message requesting a call back. PLAN: will mail unsuccessful outreach letter.  Will reattempt in 3 days.  Tomasa Rand, RN, BSN, CEN [facility] [employer] [phone]

## 2019-05-10 DIAGNOSIS — I10 Essential (primary) hypertension: Secondary | ICD-10-CM | POA: Diagnosis not present

## 2019-05-10 DIAGNOSIS — Z9981 Dependence on supplemental oxygen: Secondary | ICD-10-CM | POA: Diagnosis not present

## 2019-05-10 DIAGNOSIS — Z7901 Long term (current) use of anticoagulants: Secondary | ICD-10-CM | POA: Diagnosis not present

## 2019-05-10 DIAGNOSIS — I4891 Unspecified atrial fibrillation: Secondary | ICD-10-CM | POA: Diagnosis not present

## 2019-05-10 DIAGNOSIS — U071 COVID-19: Secondary | ICD-10-CM | POA: Diagnosis not present

## 2019-05-10 DIAGNOSIS — Z856 Personal history of leukemia: Secondary | ICD-10-CM | POA: Diagnosis not present

## 2019-05-10 DIAGNOSIS — Z8673 Personal history of transient ischemic attack (TIA), and cerebral infarction without residual deficits: Secondary | ICD-10-CM | POA: Diagnosis not present

## 2019-05-10 DIAGNOSIS — Z87891 Personal history of nicotine dependence: Secondary | ICD-10-CM | POA: Diagnosis not present

## 2019-05-10 DIAGNOSIS — Z8701 Personal history of pneumonia (recurrent): Secondary | ICD-10-CM | POA: Diagnosis not present

## 2019-05-10 DIAGNOSIS — E039 Hypothyroidism, unspecified: Secondary | ICD-10-CM | POA: Diagnosis not present

## 2019-05-11 ENCOUNTER — Ambulatory Visit: Payer: Medicare HMO | Admitting: Cardiology

## 2019-05-12 ENCOUNTER — Other Ambulatory Visit: Payer: Self-pay

## 2019-05-12 NOTE — Patient Outreach (Signed)
Re Emmi:  Placed call to patient who answered. Reviewed reason for call. Patient reports she is doing well and denies any swelling or weight change.  Reports she realized automated call recorded her call wrong.  PLAN:will close case as no needs.  Tomasa Rand, RN, BSN, CEN [facility] [employer] [phone]

## 2019-05-13 ENCOUNTER — Other Ambulatory Visit: Payer: Self-pay

## 2019-05-13 DIAGNOSIS — Z8673 Personal history of transient ischemic attack (TIA), and cerebral infarction without residual deficits: Secondary | ICD-10-CM | POA: Diagnosis not present

## 2019-05-13 DIAGNOSIS — Z7901 Long term (current) use of anticoagulants: Secondary | ICD-10-CM | POA: Diagnosis not present

## 2019-05-13 DIAGNOSIS — I4891 Unspecified atrial fibrillation: Secondary | ICD-10-CM | POA: Diagnosis not present

## 2019-05-13 DIAGNOSIS — U071 COVID-19: Secondary | ICD-10-CM | POA: Diagnosis not present

## 2019-05-13 DIAGNOSIS — E039 Hypothyroidism, unspecified: Secondary | ICD-10-CM | POA: Diagnosis not present

## 2019-05-13 DIAGNOSIS — I10 Essential (primary) hypertension: Secondary | ICD-10-CM | POA: Diagnosis not present

## 2019-05-13 DIAGNOSIS — Z8701 Personal history of pneumonia (recurrent): Secondary | ICD-10-CM | POA: Diagnosis not present

## 2019-05-13 DIAGNOSIS — Z9981 Dependence on supplemental oxygen: Secondary | ICD-10-CM | POA: Diagnosis not present

## 2019-05-13 DIAGNOSIS — Z87891 Personal history of nicotine dependence: Secondary | ICD-10-CM | POA: Diagnosis not present

## 2019-05-13 DIAGNOSIS — Z856 Personal history of leukemia: Secondary | ICD-10-CM | POA: Diagnosis not present

## 2019-05-13 NOTE — Patient Outreach (Signed)
Red Emmi: Date of red emmi:  05/12/2019 Reason for alert:  Feeling more short of breath.  Placed call to patient who answered and reports she is not feeling short of breath. Reports difficultly with automated calls.  Request to be removed from automated calls.  PLAN: denies needs. Will close case. Will ask for patient to be removed from [facility] program.  Tomasa Rand, RN, BSN, [place] Coordinator [phone]

## 2019-05-17 DIAGNOSIS — I480 Paroxysmal atrial fibrillation: Secondary | ICD-10-CM | POA: Diagnosis not present

## 2019-05-17 DIAGNOSIS — Z856 Personal history of leukemia: Secondary | ICD-10-CM | POA: Diagnosis not present

## 2019-05-17 DIAGNOSIS — E039 Hypothyroidism, unspecified: Secondary | ICD-10-CM | POA: Diagnosis not present

## 2019-05-17 DIAGNOSIS — R011 Cardiac murmur, unspecified: Secondary | ICD-10-CM | POA: Diagnosis not present

## 2019-05-17 DIAGNOSIS — Z8673 Personal history of transient ischemic attack (TIA), and cerebral infarction without residual deficits: Secondary | ICD-10-CM | POA: Diagnosis not present

## 2019-05-17 DIAGNOSIS — I4891 Unspecified atrial fibrillation: Secondary | ICD-10-CM | POA: Diagnosis not present

## 2019-05-17 DIAGNOSIS — I10 Essential (primary) hypertension: Secondary | ICD-10-CM | POA: Diagnosis not present

## 2019-05-17 DIAGNOSIS — I639 Cerebral infarction, unspecified: Secondary | ICD-10-CM | POA: Diagnosis not present

## 2019-05-17 DIAGNOSIS — U071 COVID-19: Secondary | ICD-10-CM | POA: Diagnosis not present

## 2019-05-17 DIAGNOSIS — Z7901 Long term (current) use of anticoagulants: Secondary | ICD-10-CM | POA: Diagnosis not present

## 2019-05-17 DIAGNOSIS — Z87891 Personal history of nicotine dependence: Secondary | ICD-10-CM | POA: Diagnosis not present

## 2019-05-17 DIAGNOSIS — Z8701 Personal history of pneumonia (recurrent): Secondary | ICD-10-CM | POA: Diagnosis not present

## 2019-05-17 DIAGNOSIS — Z9981 Dependence on supplemental oxygen: Secondary | ICD-10-CM | POA: Diagnosis not present

## 2019-05-18 DIAGNOSIS — I4891 Unspecified atrial fibrillation: Secondary | ICD-10-CM | POA: Diagnosis not present

## 2019-05-18 DIAGNOSIS — Z9981 Dependence on supplemental oxygen: Secondary | ICD-10-CM | POA: Diagnosis not present

## 2019-05-18 DIAGNOSIS — Z87891 Personal history of nicotine dependence: Secondary | ICD-10-CM | POA: Diagnosis not present

## 2019-05-18 DIAGNOSIS — U071 COVID-19: Secondary | ICD-10-CM | POA: Diagnosis not present

## 2019-05-18 DIAGNOSIS — I10 Essential (primary) hypertension: Secondary | ICD-10-CM | POA: Diagnosis not present

## 2019-05-18 DIAGNOSIS — Z8701 Personal history of pneumonia (recurrent): Secondary | ICD-10-CM | POA: Diagnosis not present

## 2019-05-18 DIAGNOSIS — Z8673 Personal history of transient ischemic attack (TIA), and cerebral infarction without residual deficits: Secondary | ICD-10-CM | POA: Diagnosis not present

## 2019-05-18 DIAGNOSIS — Z856 Personal history of leukemia: Secondary | ICD-10-CM | POA: Diagnosis not present

## 2019-05-18 DIAGNOSIS — Z7901 Long term (current) use of anticoagulants: Secondary | ICD-10-CM | POA: Diagnosis not present

## 2019-05-18 DIAGNOSIS — E039 Hypothyroidism, unspecified: Secondary | ICD-10-CM | POA: Diagnosis not present

## 2019-05-19 DIAGNOSIS — I4891 Unspecified atrial fibrillation: Secondary | ICD-10-CM | POA: Diagnosis not present

## 2019-05-19 DIAGNOSIS — U071 COVID-19: Secondary | ICD-10-CM | POA: Diagnosis not present

## 2019-05-19 DIAGNOSIS — Z856 Personal history of leukemia: Secondary | ICD-10-CM | POA: Diagnosis not present

## 2019-05-19 DIAGNOSIS — Z8701 Personal history of pneumonia (recurrent): Secondary | ICD-10-CM | POA: Diagnosis not present

## 2019-05-19 DIAGNOSIS — Z9981 Dependence on supplemental oxygen: Secondary | ICD-10-CM | POA: Diagnosis not present

## 2019-05-19 DIAGNOSIS — I10 Essential (primary) hypertension: Secondary | ICD-10-CM | POA: Diagnosis not present

## 2019-05-19 DIAGNOSIS — E039 Hypothyroidism, unspecified: Secondary | ICD-10-CM | POA: Diagnosis not present

## 2019-05-19 DIAGNOSIS — Z8673 Personal history of transient ischemic attack (TIA), and cerebral infarction without residual deficits: Secondary | ICD-10-CM | POA: Diagnosis not present

## 2019-05-19 DIAGNOSIS — Z7901 Long term (current) use of anticoagulants: Secondary | ICD-10-CM | POA: Diagnosis not present

## 2019-05-19 DIAGNOSIS — Z87891 Personal history of nicotine dependence: Secondary | ICD-10-CM | POA: Diagnosis not present

## 2019-05-24 ENCOUNTER — Other Ambulatory Visit: Payer: Self-pay | Admitting: Family Medicine

## 2019-05-24 DIAGNOSIS — I4891 Unspecified atrial fibrillation: Secondary | ICD-10-CM | POA: Diagnosis not present

## 2019-05-24 DIAGNOSIS — Z856 Personal history of leukemia: Secondary | ICD-10-CM | POA: Diagnosis not present

## 2019-05-24 DIAGNOSIS — Z9981 Dependence on supplemental oxygen: Secondary | ICD-10-CM | POA: Diagnosis not present

## 2019-05-24 DIAGNOSIS — Z8701 Personal history of pneumonia (recurrent): Secondary | ICD-10-CM | POA: Diagnosis not present

## 2019-05-24 DIAGNOSIS — E039 Hypothyroidism, unspecified: Secondary | ICD-10-CM | POA: Diagnosis not present

## 2019-05-24 DIAGNOSIS — U071 COVID-19: Secondary | ICD-10-CM | POA: Diagnosis not present

## 2019-05-24 DIAGNOSIS — Z8673 Personal history of transient ischemic attack (TIA), and cerebral infarction without residual deficits: Secondary | ICD-10-CM | POA: Diagnosis not present

## 2019-05-24 DIAGNOSIS — Z7901 Long term (current) use of anticoagulants: Secondary | ICD-10-CM | POA: Diagnosis not present

## 2019-05-24 DIAGNOSIS — I10 Essential (primary) hypertension: Secondary | ICD-10-CM | POA: Diagnosis not present

## 2019-05-24 DIAGNOSIS — Z87891 Personal history of nicotine dependence: Secondary | ICD-10-CM | POA: Diagnosis not present

## 2019-05-26 DIAGNOSIS — I4891 Unspecified atrial fibrillation: Secondary | ICD-10-CM | POA: Diagnosis not present

## 2019-05-26 DIAGNOSIS — E039 Hypothyroidism, unspecified: Secondary | ICD-10-CM | POA: Diagnosis not present

## 2019-05-26 DIAGNOSIS — Z9981 Dependence on supplemental oxygen: Secondary | ICD-10-CM | POA: Diagnosis not present

## 2019-05-26 DIAGNOSIS — Z8701 Personal history of pneumonia (recurrent): Secondary | ICD-10-CM | POA: Diagnosis not present

## 2019-05-26 DIAGNOSIS — Z7901 Long term (current) use of anticoagulants: Secondary | ICD-10-CM | POA: Diagnosis not present

## 2019-05-26 DIAGNOSIS — I10 Essential (primary) hypertension: Secondary | ICD-10-CM | POA: Diagnosis not present

## 2019-05-26 DIAGNOSIS — Z87891 Personal history of nicotine dependence: Secondary | ICD-10-CM | POA: Diagnosis not present

## 2019-05-26 DIAGNOSIS — Z8673 Personal history of transient ischemic attack (TIA), and cerebral infarction without residual deficits: Secondary | ICD-10-CM | POA: Diagnosis not present

## 2019-05-26 DIAGNOSIS — U071 COVID-19: Secondary | ICD-10-CM | POA: Diagnosis not present

## 2019-05-26 DIAGNOSIS — Z856 Personal history of leukemia: Secondary | ICD-10-CM | POA: Diagnosis not present

## 2019-05-31 DIAGNOSIS — Z7901 Long term (current) use of anticoagulants: Secondary | ICD-10-CM | POA: Diagnosis not present

## 2019-05-31 DIAGNOSIS — Z8701 Personal history of pneumonia (recurrent): Secondary | ICD-10-CM | POA: Diagnosis not present

## 2019-05-31 DIAGNOSIS — Z9981 Dependence on supplemental oxygen: Secondary | ICD-10-CM | POA: Diagnosis not present

## 2019-05-31 DIAGNOSIS — U071 COVID-19: Secondary | ICD-10-CM | POA: Diagnosis not present

## 2019-05-31 DIAGNOSIS — Z856 Personal history of leukemia: Secondary | ICD-10-CM | POA: Diagnosis not present

## 2019-05-31 DIAGNOSIS — I10 Essential (primary) hypertension: Secondary | ICD-10-CM | POA: Diagnosis not present

## 2019-05-31 DIAGNOSIS — I4891 Unspecified atrial fibrillation: Secondary | ICD-10-CM | POA: Diagnosis not present

## 2019-05-31 DIAGNOSIS — E039 Hypothyroidism, unspecified: Secondary | ICD-10-CM | POA: Diagnosis not present

## 2019-05-31 DIAGNOSIS — Z8673 Personal history of transient ischemic attack (TIA), and cerebral infarction without residual deficits: Secondary | ICD-10-CM | POA: Diagnosis not present

## 2019-05-31 DIAGNOSIS — Z87891 Personal history of nicotine dependence: Secondary | ICD-10-CM | POA: Diagnosis not present

## 2019-06-02 ENCOUNTER — Other Ambulatory Visit: Payer: Self-pay | Admitting: Family Medicine

## 2019-06-04 DIAGNOSIS — I4891 Unspecified atrial fibrillation: Secondary | ICD-10-CM | POA: Diagnosis not present

## 2019-06-04 DIAGNOSIS — U071 COVID-19: Secondary | ICD-10-CM | POA: Diagnosis not present

## 2019-06-04 DIAGNOSIS — E039 Hypothyroidism, unspecified: Secondary | ICD-10-CM | POA: Diagnosis not present

## 2019-06-04 DIAGNOSIS — Z7901 Long term (current) use of anticoagulants: Secondary | ICD-10-CM | POA: Diagnosis not present

## 2019-06-04 DIAGNOSIS — Z8701 Personal history of pneumonia (recurrent): Secondary | ICD-10-CM | POA: Diagnosis not present

## 2019-06-04 DIAGNOSIS — Z87891 Personal history of nicotine dependence: Secondary | ICD-10-CM | POA: Diagnosis not present

## 2019-06-04 DIAGNOSIS — Z9181 History of falling: Secondary | ICD-10-CM | POA: Diagnosis not present

## 2019-06-04 DIAGNOSIS — Z9981 Dependence on supplemental oxygen: Secondary | ICD-10-CM | POA: Diagnosis not present

## 2019-06-04 DIAGNOSIS — M6281 Muscle weakness (generalized): Secondary | ICD-10-CM

## 2019-06-04 DIAGNOSIS — Z856 Personal history of leukemia: Secondary | ICD-10-CM | POA: Diagnosis not present

## 2019-06-04 DIAGNOSIS — Z8673 Personal history of transient ischemic attack (TIA), and cerebral infarction without residual deficits: Secondary | ICD-10-CM | POA: Diagnosis not present

## 2019-06-04 DIAGNOSIS — I10 Essential (primary) hypertension: Secondary | ICD-10-CM | POA: Diagnosis not present

## 2019-06-08 DIAGNOSIS — I10 Essential (primary) hypertension: Secondary | ICD-10-CM | POA: Diagnosis not present

## 2019-06-08 DIAGNOSIS — Z87891 Personal history of nicotine dependence: Secondary | ICD-10-CM | POA: Diagnosis not present

## 2019-06-08 DIAGNOSIS — Z856 Personal history of leukemia: Secondary | ICD-10-CM | POA: Diagnosis not present

## 2019-06-08 DIAGNOSIS — Z8673 Personal history of transient ischemic attack (TIA), and cerebral infarction without residual deficits: Secondary | ICD-10-CM | POA: Diagnosis not present

## 2019-06-08 DIAGNOSIS — U071 COVID-19: Secondary | ICD-10-CM | POA: Diagnosis not present

## 2019-06-08 DIAGNOSIS — I4891 Unspecified atrial fibrillation: Secondary | ICD-10-CM | POA: Diagnosis not present

## 2019-06-08 DIAGNOSIS — Z8701 Personal history of pneumonia (recurrent): Secondary | ICD-10-CM | POA: Diagnosis not present

## 2019-06-08 DIAGNOSIS — Z7901 Long term (current) use of anticoagulants: Secondary | ICD-10-CM | POA: Diagnosis not present

## 2019-06-08 DIAGNOSIS — E039 Hypothyroidism, unspecified: Secondary | ICD-10-CM | POA: Diagnosis not present

## 2019-06-08 DIAGNOSIS — Z9981 Dependence on supplemental oxygen: Secondary | ICD-10-CM | POA: Diagnosis not present

## 2019-06-09 DIAGNOSIS — Z7901 Long term (current) use of anticoagulants: Secondary | ICD-10-CM | POA: Diagnosis not present

## 2019-06-09 DIAGNOSIS — I4891 Unspecified atrial fibrillation: Secondary | ICD-10-CM | POA: Diagnosis not present

## 2019-06-09 DIAGNOSIS — Z9981 Dependence on supplemental oxygen: Secondary | ICD-10-CM | POA: Diagnosis not present

## 2019-06-09 DIAGNOSIS — Z856 Personal history of leukemia: Secondary | ICD-10-CM | POA: Diagnosis not present

## 2019-06-09 DIAGNOSIS — U071 COVID-19: Secondary | ICD-10-CM | POA: Diagnosis not present

## 2019-06-09 DIAGNOSIS — I10 Essential (primary) hypertension: Secondary | ICD-10-CM | POA: Diagnosis not present

## 2019-06-09 DIAGNOSIS — Z87891 Personal history of nicotine dependence: Secondary | ICD-10-CM | POA: Diagnosis not present

## 2019-06-09 DIAGNOSIS — E039 Hypothyroidism, unspecified: Secondary | ICD-10-CM | POA: Diagnosis not present

## 2019-06-09 DIAGNOSIS — Z8701 Personal history of pneumonia (recurrent): Secondary | ICD-10-CM | POA: Diagnosis not present

## 2019-06-09 DIAGNOSIS — Z8673 Personal history of transient ischemic attack (TIA), and cerebral infarction without residual deficits: Secondary | ICD-10-CM | POA: Diagnosis not present

## 2019-06-16 DIAGNOSIS — I4891 Unspecified atrial fibrillation: Secondary | ICD-10-CM | POA: Diagnosis not present

## 2019-06-16 DIAGNOSIS — Z87891 Personal history of nicotine dependence: Secondary | ICD-10-CM | POA: Diagnosis not present

## 2019-06-16 DIAGNOSIS — I10 Essential (primary) hypertension: Secondary | ICD-10-CM | POA: Diagnosis not present

## 2019-06-16 DIAGNOSIS — Z856 Personal history of leukemia: Secondary | ICD-10-CM | POA: Diagnosis not present

## 2019-06-16 DIAGNOSIS — Z9981 Dependence on supplemental oxygen: Secondary | ICD-10-CM | POA: Diagnosis not present

## 2019-06-16 DIAGNOSIS — Z8673 Personal history of transient ischemic attack (TIA), and cerebral infarction without residual deficits: Secondary | ICD-10-CM | POA: Diagnosis not present

## 2019-06-16 DIAGNOSIS — Z8701 Personal history of pneumonia (recurrent): Secondary | ICD-10-CM | POA: Diagnosis not present

## 2019-06-16 DIAGNOSIS — U071 COVID-19: Secondary | ICD-10-CM | POA: Diagnosis not present

## 2019-06-16 DIAGNOSIS — E039 Hypothyroidism, unspecified: Secondary | ICD-10-CM | POA: Diagnosis not present

## 2019-06-16 DIAGNOSIS — Z7901 Long term (current) use of anticoagulants: Secondary | ICD-10-CM | POA: Diagnosis not present

## 2019-07-15 ENCOUNTER — Telehealth: Payer: Self-pay | Admitting: Family Medicine

## 2019-07-15 NOTE — Progress Notes (Signed)
  Chronic Care Management   Note  07/15/2019 Name: BERNARDETTE TRUJEQUE MRN: [id number] DOB: 1945-08-02  KURSTEN FINNEGAN is a 74 y.o. year old female who is a primary care patient of Cox, Kirsten, MD. I reached out to [name] by phone today in response to a referral sent by Ms. Berneice Gandy Maudlin's PCP, Cox, Kirsten, MD.   Ms. Patrone was given information about Chronic Care Management services today including:  1. CCM service includes personalized support from designated clinical staff supervised by her physician, including individualized plan of care and coordination with other care providers 2. 24/7 contact phone numbers for assistance for urgent and routine care needs. 3. Service will only be billed when office clinical staff spend 20 minutes or more in a month to coordinate care. 4. Only one practitioner may furnish and bill the service in a calendar month. 5. The patient may stop CCM services at any time (effective at the end of the month) by phone call to the office staff.   Patient agreed to services and verbal consent obtained.   Follow up plan:   Earney Hamburg Upstream Scheduler

## 2019-07-20 ENCOUNTER — Other Ambulatory Visit: Payer: Self-pay | Admitting: Family Medicine

## 2019-07-26 ENCOUNTER — Other Ambulatory Visit: Payer: Self-pay | Admitting: Family Medicine

## 2019-07-26 NOTE — Telephone Encounter (Signed)
Patient is due for labs and a doctor's visit. Thanks, Dr. Tobie Poet

## 2019-07-27 ENCOUNTER — Other Ambulatory Visit: Payer: Self-pay | Admitting: Family Medicine

## 2019-08-12 NOTE — Chronic Care Management (AMB) (Deleted)
Chronic Care Management Pharmacy  Name: Cynthia Gardner  MRN: [id number] DOB: 1946/02/11  Chief Complaint/ HPI  Cynthia Gardner,  74 y.o. , female presents for their Initial CCM visit with the clinical pharmacist via telephone due to COVID-19 Pandemic.  PCP : Cynthia Brome, MD  Their chronic conditions include: HTN, Afib, CVA, Hypothyroidism, CLL.   Office Visits:*** 04/25/2019 - Transition of Care visit. No medication changes. Patient presents on 2L of O2 and using walker.  Consult Visit:*** 05/17/2019 - Cardio - atrial fibrillation during COVID Pneumona. Continue Eliquis for at least 6 more months to evaluate A fib once off of Oxygen.  04/06/2019 - ED visit with transfer to [facility] for COVID pneumonia requiring O2. Discharged from [facility] 04/28/2019. Changed Plavix to Eliquis due to Afib and added Metoprolol tartrate.   Medications: Outpatient Encounter Medications as of 08/15/2019  Medication Sig  . apixaban (ELIQUIS) 5 MG TABS tablet Take 1 tablet (5 mg total) by mouth 2 (two) times daily.  . cetirizine (ZYRTEC) 10 MG tablet TAKE 1 TABLET BY MOUTH EVERY DAY  . chlorthalidone (HYGROTON) 25 MG tablet Take 25 mg by mouth daily.  . Cholecalciferol (VITAMIN D3 PO) Take 1 tablet by mouth daily.  . cloNIDine (CATAPRES) 0.3 MG tablet TAKE 1 TABLET TWICE A DAY  . CVS CHEWABLE C WITH ROSE HIPS 500 MG CHEW TAKE 1 TABLET BY MOUTH EVERY DAY  . fluticasone (FLONASE) 50 MCG/ACT nasal spray Place 2 sprays into both nostrils at bedtime.   . folic acid (FOLVITE) 1 MG tablet Take 1 mg by mouth daily.  . hydroxypropyl methylcellulose / hypromellose (ISOPTO TEARS / GONIOVISC) 2.5 % ophthalmic solution Place 1 drop into both eyes 4 (four) times daily as needed for dry eyes.  Marland Kitchen levothyroxine (SYNTHROID) 50 MCG tablet Take 1 tablet (50 mcg total) by mouth daily. Please call for an appointment. Thank you, Dr. Tobie Poet  . metoprolol tartrate (LOPRESSOR) 50 MG tablet Take 1 tablet (50 mg total) by mouth 2 (two)  times daily.  . Multiple Vitamins-Minerals (ZINC PO) Take 1 tablet by mouth daily.  . potassium chloride (MICRO-K) 10 MEQ CR capsule Take 40 mEq by mouth daily.  . Vitamin D, Ergocalciferol, (DRISDOL) 1.25 MG (50000 UNIT) CAPS capsule TAKE 1 CAPSULE 2 TIMES A WEEK  . VITAMIN E PO Take 1 capsule by mouth daily.   No facility-administered encounter medications on file as of 08/15/2019.     Current Diagnosis/Assessment:  Goals Addressed   None     AFIB   Patient is currently rate controlled. HR 79 BPM  Patient has failed these meds in past: clopidogrel 75 mg qhs Patient is currently {CHL Controlled/[phone]} on the following medications: Eliquis 5 mg bid, metoprolol tartrate 50 mg bid  We discussed:  {CHL HP Upstream Pharmacy discussion:[phone]}  Plan  Continue {CHL HP Upstream Pharmacy Plans:[phone]}  Hypothyroidism   TSH  Date Value Ref Range Status  04/18/2019 5.912 (H) 0.350 - 4.500 uIU/mL Final    Comment:    Performed by a 3rd Generation assay with a functional sensitivity of <=0.01 uIU/mL. Performed at [facility], [place] [address], [place], [place] [address]      Patient has failed these meds in past: *** Patient is currently {CHL Controlled/[phone]} on the following medications: levothyroxine 50 mcg daily We discussed:  {CHL HP Upstream Pharmacy discussion:[phone]}  Plan  Continue {CHL HP Upstream Pharmacy Plans:[phone]}  Hypertension   BP today is:  {CHL HP UPSTREAM Pharmacist  BP [phone]}  Office blood pressures are  BP Readings from Last 3 Encounters:  04/18/19 138/68    Patient has failed these meds in the past: amlodipine, losartan/hctz and valsartan Patient is currently controlled/uncontrolled*** on the following medications: chlorthalidone 25 mg daily, metoprolol tartrate 50 mg bid, clonidine 0.3 mg bid Patient checks BP at home {CHL HP BP Monitoring  Frequency:[phone]}  Patient home BP readings are ranging: ***  We discussed {CHL HP Upstream Pharmacy discussion:[phone]}  Plan  Continue {CHL HP Upstream Pharmacy Plans:[phone]}   ***Allergies   Patient has failed these meds in past: *** Patient is currently {CHL Controlled/Uncontrolled:[phone]} on the following medications: cetirizine 10 mg daily, fluticasone 50 mcg 2 sprays qhs. We discussed:  ***  Plan  Continue {CHL HP Upstream Pharmacy Plans:[phone]}   Health Maintenance   Patient is currently {CHL Controlled/Uncontrolled:[phone]} on the following medications: *** Vitamin E -  Vitamin D 50,000 twice a week -  Isopto tears -  Folic acid -  CVS vitamin C with Rose Hips chewable -  Zinc -  We discussed:  ***  Plan  Continue {CHL HP Upstream Pharmacy GL:[id number]  Vaccines   Reviewed and discussed patient's vaccination history.  Patient has had Prevnar and Pneumovax vaccine.    There is no immunization history on file for this patient.  Plan  Recommended patient receive *** vaccine in *** office/pharmacy.     Medication Management   [facility] medications Uses pill box? {Yes or If no, why not?:20788} Pt endorses ***% compliance  We discussed: ***  Plan  {US Pharmacy IK:[id number]    Follow up: *** month phone visit  ***

## 2019-08-15 ENCOUNTER — Other Ambulatory Visit: Payer: Self-pay | Admitting: Family Medicine

## 2019-08-15 ENCOUNTER — Telehealth: Payer: Medicare HMO

## 2019-08-26 ENCOUNTER — Other Ambulatory Visit: Payer: Self-pay

## 2019-08-26 DIAGNOSIS — E039 Hypothyroidism, unspecified: Secondary | ICD-10-CM

## 2019-08-26 DIAGNOSIS — I10 Essential (primary) hypertension: Secondary | ICD-10-CM

## 2019-08-26 NOTE — Progress Notes (Signed)
Patient had an appt on 08/15/2019 with [name], PharmD.

## 2019-09-05 ENCOUNTER — Telehealth: Payer: Self-pay | Admitting: Family Medicine

## 2019-09-05 NOTE — Progress Notes (Signed)
  Chronic Care Management   Outreach Note  09/05/2019 Name: JEWELS LANGONE MRN: [id number] DOB: [birth year]  Referred by: Rochel Brome, MD Reason for referral : No chief complaint on file.   An unsuccessful telephone outreach was attempted today. The patient was referred to the pharmacist for assistance with care management and care coordination.   This note is not being shared with the patient for the following reason: To respect privacy (The patient or proxy has requested that the information not be shared).  Follow Up Plan:   Earney Hamburg Upstream Scheduler

## 2019-09-11 ENCOUNTER — Other Ambulatory Visit: Payer: Self-pay | Admitting: Family Medicine

## 2019-09-12 ENCOUNTER — Other Ambulatory Visit: Payer: Self-pay | Admitting: Family Medicine

## 2019-09-12 DIAGNOSIS — L650 Telogen effluvium: Secondary | ICD-10-CM

## 2019-09-12 DIAGNOSIS — I4891 Unspecified atrial fibrillation: Secondary | ICD-10-CM

## 2019-09-12 DIAGNOSIS — E038 Other specified hypothyroidism: Secondary | ICD-10-CM

## 2019-09-12 DIAGNOSIS — I10 Essential (primary) hypertension: Secondary | ICD-10-CM | POA: Diagnosis not present

## 2019-09-12 NOTE — Progress Notes (Signed)
labwork only. Pt needs to make an appointment. Kc

## 2019-09-13 LAB — CBC WITH DIFFERENTIAL/PLATELET
Basophils Absolute: 0.2 10*3/uL (ref 0.0–0.2)
Basos: 1 %
EOS (ABSOLUTE): 0.6 10*3/uL — ABNORMAL HIGH (ref 0.0–0.4)
Eos: 2 %
Hematocrit: 37.1 % (ref 34.0–46.6)
Hemoglobin: 12 g/dL (ref 11.1–15.9)
Immature Grans (Abs): 0 10*3/uL (ref 0.0–0.1)
Immature Granulocytes: 0 %
Lymphocytes Absolute: 16.9 10*3/uL — ABNORMAL HIGH (ref 0.7–3.1)
Lymphs: 68 %
MCH: 29.6 pg (ref 26.6–33.0)
MCHC: 32.3 g/dL (ref 31.5–35.7)
MCV: 91 fL (ref 79–97)
Monocytes Absolute: 2.2 10*3/uL — ABNORMAL HIGH (ref 0.1–0.9)
Monocytes: 9 %
Neutrophils Absolute: 5.1 10*3/uL (ref 1.4–7.0)
Neutrophils: 20 %
Platelets: 270 10*3/uL (ref 150–450)
RBC: 4.06 x10E6/uL (ref 3.77–5.28)
RDW: 14.4 % (ref 11.7–15.4)
WBC: 24.9 10*3/uL (ref 3.4–10.8)

## 2019-09-13 LAB — COMPREHENSIVE METABOLIC PANEL WITH GFR
ALT: 10 [IU]/L (ref 0–32)
AST: 20 [IU]/L (ref 0–40)
Albumin/Globulin Ratio: 1.6 (ref 1.2–2.2)
Albumin: 4 g/dL (ref 3.7–4.7)
Alkaline Phosphatase: 92 [IU]/L (ref 48–121)
BUN/Creatinine Ratio: 19 (ref 12–28)
BUN: 25 mg/dL (ref 8–27)
Bilirubin Total: 0.9 mg/dL (ref 0.0–1.2)
CO2: 19 mmol/L — ABNORMAL LOW (ref 20–29)
Calcium: 9.1 mg/dL (ref 8.7–10.3)
Chloride: 105 mmol/L (ref 96–106)
Creatinine, Ser: 1.31 mg/dL — ABNORMAL HIGH (ref 0.57–1.00)
GFR calc Af Amer: 46 mL/min/{1.73_m2} — ABNORMAL LOW
GFR calc non Af Amer: 40 mL/min/{1.73_m2} — ABNORMAL LOW
Globulin, Total: 2.5 g/dL (ref 1.5–4.5)
Glucose: 109 mg/dL — ABNORMAL HIGH (ref 65–99)
Potassium: 5 mmol/L (ref 3.5–5.2)
Sodium: 139 mmol/L (ref 134–144)
Total Protein: 6.5 g/dL (ref 6.0–8.5)

## 2019-09-13 LAB — FERRITIN: Ferritin: 103 ng/mL (ref 15–150)

## 2019-09-13 LAB — LIPID PANEL
Chol/HDL Ratio: 4.1 ratio (ref 0.0–4.4)
Cholesterol, Total: 153 mg/dL (ref 100–199)
HDL: 37 mg/dL — ABNORMAL LOW
LDL Chol Calc (NIH): 89 mg/dL (ref 0–99)
Triglycerides: 153 mg/dL — ABNORMAL HIGH (ref 0–149)
VLDL Cholesterol Cal: 27 mg/dL (ref 5–40)

## 2019-09-13 LAB — CARDIOVASCULAR RISK ASSESSMENT

## 2019-09-13 LAB — TSH: TSH: 3.86 u[IU]/mL (ref 0.450–4.500)

## 2019-09-19 ENCOUNTER — Other Ambulatory Visit: Payer: Self-pay

## 2019-09-19 ENCOUNTER — Encounter: Payer: Self-pay | Admitting: Family Medicine

## 2019-09-19 ENCOUNTER — Other Ambulatory Visit: Payer: Self-pay | Admitting: Family Medicine

## 2019-09-19 ENCOUNTER — Ambulatory Visit (INDEPENDENT_AMBULATORY_CARE_PROVIDER_SITE_OTHER): Payer: Medicare HMO | Admitting: Family Medicine

## 2019-09-19 VITALS — BP 110/70 | HR 63 | Temp 93.3°F | Ht 66.0 in | Wt 175.0 lb

## 2019-09-19 DIAGNOSIS — C9110 Chronic lymphocytic leukemia of B-cell type not having achieved remission: Secondary | ICD-10-CM

## 2019-09-19 DIAGNOSIS — E782 Mixed hyperlipidemia: Secondary | ICD-10-CM | POA: Diagnosis not present

## 2019-09-19 DIAGNOSIS — E038 Other specified hypothyroidism: Secondary | ICD-10-CM | POA: Diagnosis not present

## 2019-09-19 DIAGNOSIS — R7303 Prediabetes: Secondary | ICD-10-CM | POA: Insufficient documentation

## 2019-09-19 DIAGNOSIS — I10 Essential (primary) hypertension: Secondary | ICD-10-CM

## 2019-09-19 DIAGNOSIS — I6340 Cerebral infarction due to embolism of unspecified cerebral artery: Secondary | ICD-10-CM

## 2019-09-19 MED ORDER — DIPHENOXYLATE-ATROPINE 2.5-0.025 MG PO TABS: 1.0000 | ORAL_TABLET | Freq: Four times a day (QID) | ORAL | 2 refills | Status: AC | PRN

## 2019-09-19 NOTE — Progress Notes (Signed)
Subjective:  Patient ID: Cynthia Gardner, female    DOB: February 20, 1946  Age: 74 y.o. MRN: [id number]  Chief Complaint  Patient presents with  . Hypertension  . Hyperlipidemia  . Prediabetes    HPI [name] presents with a diagnosis of impaired fasting glucose.  This was diagnosed 1 years ago.  The course has been stable and nonprogressive.  Currently not having any related symptoms     Pt presents with hyperlipidemia.  Current treatment includes a low cholesterol/low fat diet.  Compliance with treatment has been good; she follows up as directed.      Pt presents for follow up of hypertension.  Her current cardiac medication regimen includes chlorthalidone 25 mg once daily, clonidine 0.3 mg twice daily, and metoprolol tartrate 50 mg one twice a day.  She is tolerating the medication well without side effects.  Compliance with treatment has been good; she takes her medication as directed and follows up as directed.      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  This was diagnosed 2008.  The course has been stable and nonprogressive.  Most recent wbc has dropped significantly and ferritin levels have dropped.  Currently not having any related symptoms Follows up with oncology. Sees them twice a year normally, but has not seen oncology recently due to covid 19.  Current Outpatient Medications on File Prior to Visit  Medication Sig Dispense Refill  . apixaban (ELIQUIS) 5 MG TABS tablet Take 1 tablet (5 mg total) by mouth 2 (two) times daily. 60 tablet 0  . cetirizine (ZYRTEC) 10 MG tablet TAKE 1 TABLET BY MOUTH EVERY DAY 90 tablet 3  . chlorthalidone (HYGROTON) 25 MG tablet Take 25 mg by mouth daily.    . Cholecalciferol (VITAMIN D3 PO) Take 1 tablet by mouth daily.    . cloNIDine (CATAPRES) 0.3 MG tablet TAKE 1 TABLET TWICE A DAY 180 tablet 1  . CVS CHEWABLE C WITH ROSE HIPS 500 MG CHEW TAKE 1 TABLET BY MOUTH EVERY DAY 100 tablet 1  .  fluticasone (FLONASE) 50 MCG/ACT nasal spray Place 2 sprays into both nostrils at bedtime.     . folic acid (FOLVITE) 1 MG tablet Take 1 mg by mouth daily.    . hydroxypropyl methylcellulose / hypromellose (ISOPTO TEARS / GONIOVISC) 2.5 % ophthalmic solution Place 1 drop into both eyes 4 (four) times daily as needed for dry eyes.    Marland Kitchen levothyroxine (SYNTHROID) 50 MCG tablet Take 1 tablet (50 mcg total) by mouth daily. Please call for an appointment. Thank you, Dr. Tobie Poet 30 tablet 0  . metoprolol tartrate (LOPRESSOR) 50 MG tablet Take 1 tablet (50 mg total) by mouth 2 (two) times daily. 60 tablet 0  . Multiple Vitamins-Minerals (ZINC PO) Take 1 tablet by mouth daily.    . potassium chloride (MICRO-K) 10 MEQ CR capsule TAKE 4 CAPSULES BY MOUTH EVERY DAY 360 capsule 1  . Vitamin D, Ergocalciferol, (DRISDOL) 1.25 MG (50000 UNIT) CAPS capsule TAKE 1 CAPSULE 2 TIMES A WEEK 24 capsule 0  . VITAMIN E PO Take 1 capsule by mouth daily.     No current facility-administered medications on file prior to visit.   Past Medical History:  Diagnosis Date  . Antiphospholipid antibody syndrome ([place])   . CVA (cerebral vascular accident) ([place]) 04/06/2019  . History of COVID-19   . HTN (hypertension) 04/06/2019  . Hypothyroidism 04/06/2019  . Leukemia ([place])  Past Surgical History:  Procedure Laterality Date  . CATARACT EXTRACTION Right   . COLON RESECTION  2008   ischemic bowel    Family History  Problem Relation Age of Onset  . Parkinson's disease Father   . Cirrhosis Brother        from agent orange  . Hypertension Other   . Diabetes Other    Social History   Socioeconomic History  . Marital status: Married    Spouse name: Not on file  . Number of children: 2  . Years of education: Not on file  . Highest education level: Not on file  Occupational History  . Not on file  Tobacco Use  . Smoking status: Former Research scientist (life sciences)  . Smokeless tobacco: Never Used  Vaping Use  . Vaping Use: Never used    Substance and Sexual Activity  . Alcohol use: Not Currently  . Drug use: Never  . Sexual activity: Not Currently    Birth control/protection: None  Other Topics Concern  . Not on file  Social History Narrative  . Not on file   Social Determinants of Health   Financial Resource Strain:   . Difficulty of Paying Living Expenses:   Food Insecurity:   . Worried About Charity fundraiser in the Last Year:   . Arboriculturist in the Last Year:   Transportation Needs:   . Film/video editor (Medical):   Marland Kitchen Lack of Transportation (Non-Medical):   Physical Activity:   . Days of Exercise per Week:   . Minutes of Exercise per Session:   Stress:   . Feeling of Stress :   Social Connections:   . Frequency of Communication with Friends and Family:   . Frequency of Social Gatherings with Friends and Family:   . Attends Religious Services:   . Active Member of Clubs or Organizations:   . Attends Archivist Meetings:   Marland Kitchen Marital Status:     Review of Systems  Constitutional: Negative for chills, fatigue and fever.  HENT: Negative for congestion, ear pain and sore throat.   Respiratory: Negative for cough and shortness of breath.   Cardiovascular: Negative for chest pain, palpitations and leg swelling.  Gastrointestinal: Positive for diarrhea (up yesterday 4-5 times at night. GI doctor used to give her lomotil and would like a rx for this as it worked well. ). Negative for abdominal pain, constipation, nausea and vomiting.  Endocrine: Negative for polydipsia, polyphagia and polyuria.  Genitourinary: Negative for dysuria and urgency.  Musculoskeletal: Negative for arthralgias, back pain and myalgias.  Neurological: Negative for dizziness and headaches.  Psychiatric/Behavioral: Negative for dysphoric mood. The patient is not nervous/anxious.     Objective:  BP 110/70   Pulse 63   Temp (!) 93.3 F (34.1 C)   Ht 5\' 6"  (1.676 m)   Wt 175 lb (79.4 kg)   SpO2 100%   BMI  28.25 kg/m   BP/Weight 09/19/2019 2/37/6283 04/05/1759  Systolic BP 607 371 -  Diastolic BP 70 68 -  Wt. (Lbs) 175 - 179.9  BMI 28.25 - 29.04    Physical Exam Vitals reviewed.  Constitutional:      Appearance: Normal appearance. She is normal weight.  Neck:     Vascular: No carotid bruit.  Cardiovascular:     Rate and Rhythm: Normal rate and regular rhythm.     Pulses: Normal pulses.     Heart sounds: Normal heart sounds.  Pulmonary:  Effort: Pulmonary effort is normal. No respiratory distress.     Breath sounds: Normal breath sounds.  Abdominal:     General: Abdomen is flat. Bowel sounds are normal.     Palpations: Abdomen is soft.     Tenderness: There is no abdominal tenderness.  Neurological:     Mental Status: She is alert and oriented to person, place, and time.  Psychiatric:        Mood and Affect: Mood normal.        Behavior: Behavior normal.     Diabetic Foot Exam - Simple   No data filed       Lab Results  Component Value Date   WBC 24.9 (HH) 09/12/2019   HGB 12.0 09/12/2019   HCT 37.1 09/12/2019   PLT 270 09/12/2019   GLUCOSE 109 (H) 09/12/2019   CHOL 153 09/12/2019   TRIG 153 (H) 09/12/2019   HDL 37 (L) 09/12/2019   LDLCALC 89 09/12/2019   ALT 10 09/12/2019   AST 20 09/12/2019   NA 139 09/12/2019   K 5.0 09/12/2019   CL 105 09/12/2019   CREATININE 1.31 (H) 09/12/2019   BUN 25 09/12/2019   CO2 19 (L) 09/12/2019   TSH 3.860 09/12/2019   HGBA1C 6.1 (H) 04/06/2019      Assessment & Plan:  1. Essential hypertension Well controlled.  No changes to medicines.  Continue to work on eating a healthy diet and exercise.   2. Prediabetes Recommend continue to work on eating healthy diet and exercise.  3. Mixed hyperlipidemia Well controlled.  No changes to medicines.  Continue to work on eating a healthy diet and exercise.    4. CLL (chronic lymphocytic leukemia) (HCC)  WBC decreased. Sent labs to oncology.  5. Cerebrovascular accident  (CVA) due to embolism of cerebral artery ([place]) Due to atrial fibrillation. On eliquis now. No sequelae.  6. Other specified hypothyroidism The current medical regimen is effective;  continue present plan and medications.    Follow-up: Return in about 3 months (around 12/20/2019) for fasting.  An After Visit Summary was printed and given to the patient.  Rochel Brome [facility] [phone]

## 2019-09-22 ENCOUNTER — Other Ambulatory Visit: Payer: Self-pay | Admitting: Family Medicine

## 2019-10-10 ENCOUNTER — Other Ambulatory Visit: Payer: Self-pay | Admitting: Legal Medicine

## 2019-10-10 ENCOUNTER — Other Ambulatory Visit: Payer: Self-pay | Admitting: Family Medicine

## 2019-10-10 DIAGNOSIS — E038 Other specified hypothyroidism: Secondary | ICD-10-CM

## 2019-10-10 MED ORDER — LEVOTHYROXINE SODIUM 50 MCG PO TABS: 50.0000 ug | ORAL_TABLET | Freq: Every day | ORAL | 6 refills | Status: DC

## 2019-10-23 ENCOUNTER — Other Ambulatory Visit: Payer: Self-pay | Admitting: Family Medicine

## 2019-11-14 DIAGNOSIS — C9110 Chronic lymphocytic leukemia of B-cell type not having achieved remission: Secondary | ICD-10-CM | POA: Diagnosis not present

## 2019-12-02 ENCOUNTER — Other Ambulatory Visit: Payer: Self-pay | Admitting: Family Medicine

## 2019-12-13 ENCOUNTER — Other Ambulatory Visit: Payer: Self-pay

## 2019-12-13 DIAGNOSIS — I4891 Unspecified atrial fibrillation: Secondary | ICD-10-CM

## 2019-12-19 ENCOUNTER — Telehealth: Payer: Self-pay | Admitting: Family Medicine

## 2019-12-19 NOTE — Progress Notes (Signed)
  Chronic Care Management   Note  12/19/2019 Name: Cynthia Gardner MRN: [id number] DOB: Mar 30, 1946  Cynthia Gardner is a 74 y.o. year old female who is a primary care patient of Cox, Kirsten, MD. I reached out to [name] by phone today in response to a referral sent by [name]'s PCP, Cox, Kirsten, MD.   [name] was given information about Chronic Care Management services today including:  1. CCM service includes personalized support from designated clinical staff supervised by her physician, including individualized plan of care and coordination with other care providers 2. 24/7 contact phone numbers for assistance for urgent and routine care needs. 3. Service will only be billed when office clinical staff spend 20 minutes or more in a month to coordinate care. 4. Only one practitioner may furnish and bill the service in a calendar month. 5. The patient may stop CCM services at any time (effective at the end of the month) by phone call to the office staff.   Patient agreed to services and verbal consent obtained.   Follow up plan:   [place]

## 2019-12-20 DIAGNOSIS — C9110 Chronic lymphocytic leukemia of B-cell type not having achieved remission: Secondary | ICD-10-CM | POA: Diagnosis not present

## 2019-12-20 DIAGNOSIS — Z7901 Long term (current) use of anticoagulants: Secondary | ICD-10-CM | POA: Diagnosis not present

## 2019-12-20 DIAGNOSIS — I10 Essential (primary) hypertension: Secondary | ICD-10-CM | POA: Diagnosis not present

## 2019-12-20 DIAGNOSIS — I480 Paroxysmal atrial fibrillation: Secondary | ICD-10-CM | POA: Diagnosis not present

## 2019-12-20 DIAGNOSIS — I639 Cerebral infarction, unspecified: Secondary | ICD-10-CM | POA: Diagnosis not present

## 2019-12-26 ENCOUNTER — Ambulatory Visit (INDEPENDENT_AMBULATORY_CARE_PROVIDER_SITE_OTHER): Payer: Medicare HMO | Admitting: Family Medicine

## 2019-12-26 ENCOUNTER — Other Ambulatory Visit: Payer: Self-pay

## 2019-12-26 VITALS — BP 108/60 | HR 56 | Temp 96.4°F | Resp 16 | Ht 66.0 in | Wt 178.6 lb

## 2019-12-26 DIAGNOSIS — Z23 Encounter for immunization: Secondary | ICD-10-CM | POA: Diagnosis not present

## 2019-12-26 DIAGNOSIS — I10 Essential (primary) hypertension: Secondary | ICD-10-CM

## 2019-12-26 DIAGNOSIS — E038 Other specified hypothyroidism: Secondary | ICD-10-CM | POA: Diagnosis not present

## 2019-12-26 DIAGNOSIS — C9110 Chronic lymphocytic leukemia of B-cell type not having achieved remission: Secondary | ICD-10-CM | POA: Diagnosis not present

## 2019-12-26 DIAGNOSIS — R7303 Prediabetes: Secondary | ICD-10-CM | POA: Diagnosis not present

## 2019-12-26 DIAGNOSIS — I4891 Unspecified atrial fibrillation: Secondary | ICD-10-CM | POA: Diagnosis not present

## 2019-12-26 DIAGNOSIS — E782 Mixed hyperlipidemia: Secondary | ICD-10-CM | POA: Diagnosis not present

## 2019-12-26 DIAGNOSIS — E559 Vitamin D deficiency, unspecified: Secondary | ICD-10-CM | POA: Diagnosis not present

## 2019-12-26 DIAGNOSIS — D6861 Antiphospholipid syndrome: Secondary | ICD-10-CM

## 2019-12-26 MED ORDER — APIXABAN 5 MG PO TABS: 5.0000 mg | ORAL_TABLET | Freq: Two times a day (BID) | ORAL | 0 refills | Status: DC

## 2019-12-26 NOTE — Progress Notes (Signed)
Subjective:  Patient ID: Cynthia Gardner, female    DOB: 04/20/1945  Age: 74 y.o. MRN: [id number]  Chief Complaint  Patient presents with  . Hypertension  . Hyperlipidemia    Cynthia Gardner presents with a diagnosis of impaired fasting glucose.  Last A1c was 6.1.  The course has been stable and nonprogressive.  Currently not having any related symptoms. Eats healthy and walking for exercise.     Pt presents with hyperlipidemia.  Current treatment includes a low cholesterol/low fat diet.  Currently on no medicines. Compliance with treatment has been good; she follows up as directed.      Pt presents for follow up of hypertension.  Her current cardiac medication regimen includes chlorthalidone 25 mg once daily, clonidine 0.3 mg twice daily, and metoprolol tartrate 50 mg one twice a day.  She is tolerating the medication well without side effects.  Compliance with treatment has been good; she takes her medication as directed and follows up as directed. At home BP range 120/080's. Patient has history of AFIB with RVR which developed during covid 19 hospitalization. She is currently in NSR still continuing to take Eliquis. 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  This was diagnosed 2008.  The course has been stable and nonprogressive.  Most recent wbc has dropped significantly and ferritin levels have dropped.  Currently not having any related symptoms Follows up with oncology. Sees them twice a year normally, but has not seen oncology recently due to covid 19.  Current Outpatient Medications on File Prior to Visit  Medication Sig Dispense Refill  . Zinc Sulfate (ZINC-220 PO) Take by mouth.    . cetirizine (ZYRTEC) 10 MG tablet TAKE 1 TABLET BY MOUTH EVERY DAY 90 tablet 3  . chlorthalidone (HYGROTON) 25 MG tablet TAKE 1 TABLET BY MOUTH EVERY DAY 90 tablet 1  . Cholecalciferol (VITAMIN D3 PO) Take 1 tablet by mouth daily.    .  cloNIDine (CATAPRES) 0.3 MG tablet TAKE 1 TABLET TWICE A DAY 180 tablet 1  . [facility] CHEWABLE C WITH ROSE HIPS 500 MG CHEW TAKE 1 TABLET BY MOUTH EVERY DAY 100 tablet 1  . diphenoxylate-atropine (LOMOTIL) 2.5-0.025 MG tablet Take 1 tablet by mouth 4 (four) times daily as needed for diarrhea or loose stools. 60 tablet 2  . fluticasone (FLONASE) 50 MCG/ACT nasal spray Place 2 sprays into both nostrils at bedtime.     . folic acid (FOLVITE) 1 MG tablet TAKE 1 TABLET BY MOUTH EVERY DAY 90 tablet 1  . hydroxypropyl methylcellulose / hypromellose (ISOPTO TEARS / GONIOVISC) 2.5 % ophthalmic solution Place 1 drop into both eyes 4 (four) times daily as needed for dry eyes.    Marland Kitchen levothyroxine (SYNTHROID) 50 MCG tablet Take 1 tablet (50 mcg total) by mouth daily. 30 tablet 6  . metoprolol tartrate (LOPRESSOR) 50 MG tablet Take 1 tablet (50 mg total) by mouth 2 (two) times daily. 60 tablet 0  . Multiple Vitamins-Minerals (ZINC PO) Take 1 tablet by mouth daily.    . potassium chloride (MICRO-K) 10 MEQ CR capsule TAKE 4 CAPSULES BY MOUTH EVERY DAY 360 capsule 1  . Vitamin D, Ergocalciferol, (DRISDOL) 1.25 MG (50000 UNIT) CAPS capsule TAKE 1 CAPSULE 2 TIMES A WEEK 24 capsule 0  . VITAMIN E PO Take 1 capsule by mouth daily.     No current facility-administered medications on file prior to visit.  Past Medical History:  Diagnosis Date  . Antiphospholipid antibody syndrome ([place])   . CVA (cerebral vascular accident) ([place]) 04/06/2019  . History of COVID-19   . HTN (hypertension) 04/06/2019  . Hypothyroidism 04/06/2019  . Leukemia [facility])    Past Surgical History:  Procedure Laterality Date  . CATARACT EXTRACTION Right   . COLON RESECTION  2008   ischemic bowel    Family History  Problem Relation Age of Onset  . Parkinson's disease Father   . Cirrhosis Brother        from agent orange  . Hypertension Other   . Diabetes Other    Social History   Socioeconomic History  . Marital status: Married    Spouse  name: Not on file  . Number of children: 2  . Years of education: Not on file  . Highest education level: Not on file  Occupational History  . Not on file  Tobacco Use  . Smoking status: Former Research scientist (life sciences)  . Smokeless tobacco: Never Used  Vaping Use  . Vaping Use: Never used  Substance and Sexual Activity  . Alcohol use: Not Currently  . Drug use: Never  . Sexual activity: Not Currently    Birth control/protection: None  Other Topics Concern  . Not on file  Social History Narrative  . Not on file   Social Determinants of Health   Financial Resource Strain:   . Difficulty of Paying Living Expenses: Not on file  Food Insecurity:   . Worried About Charity fundraiser in the Last Year: Not on file  . Ran Out of Food in the Last Year: Not on file  Transportation Needs:   . Lack of Transportation (Medical): Not on file  . Lack of Transportation (Non-Medical): Not on file  Physical Activity:   . Days of Exercise per Week: Not on file  . Minutes of Exercise per Session: Not on file  Stress:   . Feeling of Stress : Not on file  Social Connections:   . Frequency of Communication with Friends and Family: Not on file  . Frequency of Social Gatherings with Friends and Family: Not on file  . Attends Religious Services: Not on file  . Active Member of Clubs or Organizations: Not on file  . Attends Archivist Meetings: Not on file  . Marital Status: Not on file    Review of Systems  Constitutional: Negative for chills, fatigue and fever.  HENT: Negative for congestion, ear pain and sore throat.   Respiratory: Negative for cough and shortness of breath.   Cardiovascular: Negative for chest pain, palpitations and leg swelling.  Gastrointestinal: Negative for abdominal pain, constipation, diarrhea (up yesterday 4-5 times at night. GI doctor used to give her lomotil and would like a rx for this as it worked well. ), nausea and vomiting.  Endocrine: Negative for polydipsia,  polyphagia and polyuria.  Genitourinary: Negative for dysuria and urgency.  Musculoskeletal: Negative for arthralgias, back pain and myalgias.  Neurological: Negative for dizziness and headaches.  Psychiatric/Behavioral: Negative for dysphoric mood. The patient is not nervous/anxious.     Objective:  BP 108/60   Pulse (!) 56   Temp (!) 96.4 F (35.8 C)   Resp 16   Ht 5\' 6"  (1.676 m)   Wt 178 lb 9.6 oz (81 kg)   BMI 28.83 kg/m   BP/Weight 12/26/2019 09/19/2019 7/41/2878  Systolic BP 676 720 947  Diastolic BP 60 70 68  Wt. (Lbs) 178.6 175 -  BMI 28.83 28.25 -    Physical Exam Vitals reviewed.  Constitutional:      Appearance: Normal appearance. She is normal weight.  Neck:     Vascular: No carotid bruit.  Cardiovascular:     Rate and Rhythm: Normal rate and regular rhythm.     Pulses: Normal pulses.     Heart sounds: Normal heart sounds.  Pulmonary:     Effort: Pulmonary effort is normal. No respiratory distress.     Breath sounds: Normal breath sounds.  Abdominal:     General: Abdomen is flat. Bowel sounds are normal.     Palpations: Abdomen is soft.     Tenderness: There is no abdominal tenderness.  Neurological:     Mental Status: She is alert and oriented to person, place, and time.  Psychiatric:        Mood and Affect: Mood normal.        Behavior: Behavior normal.     Diabetic Foot Exam - Simple   No data filed       Lab Results  Component Value Date   WBC 24.9 (HH) 09/12/2019   HGB 12.0 09/12/2019   HCT 37.1 09/12/2019   PLT 270 09/12/2019   GLUCOSE 109 (H) 09/12/2019   CHOL 153 09/12/2019   TRIG 153 (H) 09/12/2019   HDL 37 (L) 09/12/2019   LDLCALC 89 09/12/2019   ALT 10 09/12/2019   AST 20 09/12/2019   NA 139 09/12/2019   K 5.0 09/12/2019   CL 105 09/12/2019   CREATININE 1.31 (H) 09/12/2019   BUN 25 09/12/2019   CO2 19 (L) 09/12/2019   TSH 3.860 09/12/2019   HGBA1C 6.1 (H) 04/06/2019      Assessment & Plan:  1. Essential  hypertension Well controlled.  No changes to medicines.  Continue to work on eating a healthy diet and exercise.   2. Prediabetes Recommend continue to work on eating healthy diet and exercise.  3. Mixed hyperlipidemia Well controlled.  No changes to medicines.  Continue to work on eating a healthy diet and exercise.    4. CLL (chronic lymphocytic leukemia) (HCC)  Stable followed by Dr. Humphrey Rolls will send labs to oncology.  5. Atrial fibrillation with RVR (HCC) Resolved. Currently on eliquis. Consider discontinuation of eliquis. I will review cardiology notes.  6. Other specified hypothyroidism The current medical regimen is effective;  continue present plan and medications.  7. Vitamin D deficiency - VITAMIN D 25 Hydroxy (Vit-D Deficiency, Fractures)  8. Need for immunization against influenza - Flu Vaccine QUAD High Dose(Fluad)  9. Antiphospholipid AB syndrome.  - Needs to remain on eliquis lifelong.   Follow-up: Return in about 4 months (around 04/26/2020) for fasting.  An After Visit Summary was printed and given to the patient.  Rochel Brome [facility] [phone]

## 2019-12-28 LAB — CBC WITH DIFFERENTIAL/PLATELET
Basophils Absolute: 0.2 10*3/uL (ref 0.0–0.2)
Basos: 1 %
EOS (ABSOLUTE): 0.5 10*3/uL — ABNORMAL HIGH (ref 0.0–0.4)
Eos: 2 %
Hematocrit: 36.5 % (ref 34.0–46.6)
Hemoglobin: 12.1 g/dL (ref 11.1–15.9)
Immature Grans (Abs): 0 10*3/uL (ref 0.0–0.1)
Immature Granulocytes: 0 %
Lymphocytes Absolute: 23.4 10*3/uL — ABNORMAL HIGH (ref 0.7–3.1)
Lymphs: 69 %
MCH: 30.8 pg (ref 26.6–33.0)
MCHC: 33.2 g/dL (ref 31.5–35.7)
MCV: 93 fL (ref 79–97)
Monocytes Absolute: 2.2 10*3/uL — ABNORMAL HIGH (ref 0.1–0.9)
Monocytes: 7 %
Neutrophils Absolute: 7 10*3/uL (ref 1.4–7.0)
Neutrophils: 21 %
Platelets: 266 10*3/uL (ref 150–450)
RBC: 3.93 x10E6/uL (ref 3.77–5.28)
RDW: 13.3 % (ref 11.7–15.4)
WBC: 33.4 10*3/uL (ref 3.4–10.8)

## 2019-12-28 LAB — HEMOGLOBIN A1C
Est. average glucose Bld gHb Est-mCnc: 111 mg/dL
Hgb A1c MFr Bld: 5.5 % (ref 4.8–5.6)

## 2019-12-28 LAB — VITAMIN D 25 HYDROXY (VIT D DEFICIENCY, FRACTURES): Vit D, 25-Hydroxy: 28 ng/mL — ABNORMAL LOW (ref 30.0–100.0)

## 2019-12-28 LAB — COMPREHENSIVE METABOLIC PANEL WITH GFR
ALT: 13 [IU]/L (ref 0–32)
AST: 15 [IU]/L (ref 0–40)
Albumin/Globulin Ratio: 1.8 (ref 1.2–2.2)
Albumin: 4.1 g/dL (ref 3.7–4.7)
Alkaline Phosphatase: 99 [IU]/L (ref 44–121)
BUN/Creatinine Ratio: 19 (ref 12–28)
BUN: 23 mg/dL (ref 8–27)
Bilirubin Total: 0.6 mg/dL (ref 0.0–1.2)
CO2: 21 mmol/L (ref 20–29)
Calcium: 8.9 mg/dL (ref 8.7–10.3)
Chloride: 105 mmol/L (ref 96–106)
Creatinine, Ser: 1.19 mg/dL — ABNORMAL HIGH (ref 0.57–1.00)
GFR calc Af Amer: 52 mL/min/{1.73_m2} — ABNORMAL LOW
GFR calc non Af Amer: 45 mL/min/{1.73_m2} — ABNORMAL LOW
Globulin, Total: 2.3 g/dL (ref 1.5–4.5)
Glucose: 101 mg/dL — ABNORMAL HIGH (ref 65–99)
Potassium: 4.8 mmol/L (ref 3.5–5.2)
Sodium: 139 mmol/L (ref 134–144)
Total Protein: 6.4 g/dL (ref 6.0–8.5)

## 2019-12-28 LAB — LIPID PANEL
Chol/HDL Ratio: 4.4 ratio (ref 0.0–4.4)
Cholesterol, Total: 158 mg/dL (ref 100–199)
HDL: 36 mg/dL — ABNORMAL LOW
LDL Chol Calc (NIH): 94 mg/dL (ref 0–99)
Triglycerides: 158 mg/dL — ABNORMAL HIGH (ref 0–149)
VLDL Cholesterol Cal: 28 mg/dL (ref 5–40)

## 2019-12-28 LAB — CARDIOVASCULAR RISK ASSESSMENT

## 2019-12-30 NOTE — Chronic Care Management (AMB) (Signed)
Chronic Care Management Pharmacy  Name: Cynthia Gardner  MRN: [id number] DOB: 02-25-1946  Chief Complaint/ HPI  Cynthia Gardner,  74 y.o. , female presents for their Initial CCM visit with the clinical pharmacist via telephone due to COVID-19 Pandemic.  PCP : Rochel Brome, MD  Their chronic conditions include: hypertension, hx of CVA, afib with RVR, hypothyroidism, hyperlipidemia, prediabetes.   Office Visits: 12/26/2019 - flu shot given. Continue current medication.  09/19/2019 - Hypertension - current medication regimen effective.  Consult Visit: 12/20/2019 - Cardiology - Eliquis is recommended but expensive for patient. Provided a 30 day voucher. Afib well controlled.  11/14/2019 - Oncology -continue present therapy. Well controlled. F/U in 6 months.  Medications: Outpatient Encounter Medications as of 01/03/2020  Medication Sig  . apixaban (ELIQUIS) 5 MG TABS tablet Take 1 tablet (5 mg total) by mouth 2 (two) times daily.  . chlorthalidone (HYGROTON) 25 MG tablet TAKE 1 TABLET BY MOUTH EVERY DAY  . Cholecalciferol (VITAMIN D3 PO) Take 1 tablet by mouth daily.  . cloNIDine (CATAPRES) 0.3 MG tablet TAKE 1 TABLET TWICE A DAY  . [facility] CHEWABLE C WITH ROSE HIPS 500 MG CHEW TAKE 1 TABLET BY MOUTH EVERY DAY  . diphenoxylate-atropine (LOMOTIL) 2.5-0.025 MG tablet Take 1 tablet by mouth 4 (four) times daily as needed for diarrhea or loose stools.  . fluticasone (FLONASE) 50 MCG/ACT nasal spray Place 2 sprays into both nostrils at bedtime.   . folic acid (FOLVITE) 1 MG tablet TAKE 1 TABLET BY MOUTH EVERY DAY  . hydroxypropyl methylcellulose / hypromellose (ISOPTO TEARS / GONIOVISC) 2.5 % ophthalmic solution Place 1 drop into both eyes 4 (four) times daily as needed for dry eyes.  Marland Kitchen levothyroxine (SYNTHROID) 50 MCG tablet Take 1 tablet (50 mcg total) by mouth daily.  . metoprolol tartrate (LOPRESSOR) 50 MG tablet Take 1 tablet (50 mg total) by mouth 2 (two) times daily.  . potassium chloride  (MICRO-K) 10 MEQ CR capsule TAKE 4 CAPSULES BY MOUTH EVERY DAY  . Zinc Sulfate (ZINC-220 PO) Take by mouth.  . cetirizine (ZYRTEC) 10 MG tablet TAKE 1 TABLET BY MOUTH EVERY DAY (Patient not taking: Reported on 01/03/2020)  . Multiple Vitamins-Minerals (ZINC PO) Take 1 tablet by mouth daily. (Patient not taking: Reported on 01/03/2020)  . Vitamin D, Ergocalciferol, (DRISDOL) 1.25 MG (50000 UNIT) CAPS capsule TAKE 1 CAPSULE 2 TIMES A WEEK (Patient not taking: Reported on 01/03/2020)  . VITAMIN E PO Take 1 capsule by mouth daily. (Patient not taking: Reported on 01/03/2020)   No facility-administered encounter medications on file as of 01/03/2020.   No Known Allergies  SDOH Screenings   Alcohol Screen:   . Last Alcohol Screening Score (AUDIT): Not on file  Depression (PHQ2-9): Low Risk   . PHQ-2 Score: 0  Financial Resource Strain: Low Risk   . Difficulty of Paying Living Expenses: Not hard at all  Food Insecurity: No Food Insecurity  . Worried About Charity fundraiser in the Last Year: Never true  . Ran Out of Food in the Last Year: Never true  Housing: Low Risk   . Last Housing Risk Score: 0  Physical Activity:   . Days of Exercise per Week: Not on file  . Minutes of Exercise per Session: Not on file  Social Connections:   . Frequency of Communication with Friends and Family: Not on file  . Frequency of Social Gatherings with Friends and Family: Not on file  . Attends  Religious Services: Not on file  . Active Member of Clubs or Organizations: Not on file  . Attends Archivist Meetings: Not on file  . Marital Status: Not on file  Stress:   . Feeling of Stress : Not on file  Tobacco Use: Medium Risk  . Smoking Tobacco Use: Former Smoker  . Smokeless Tobacco Use: Never Used  Transportation Needs: No Transportation Needs  . Lack of Transportation (Medical): No  . Lack of Transportation (Non-Medical): No    Current Diagnosis/Assessment:  Goals Addressed             This Visit's Progress   . Pharmacy Care Plan       CARE PLAN ENTRY (see longitudinal plan of care for additional care plan information)  Current Barriers:  . Chronic Disease Management support, education, and care coordination needs related to Hypertension, Hyperlipidemia, and Atrial Fibrillation   Hypertension BP Readings from Last 3 Encounters:  12/26/19 108/60  09/19/19 110/70  04/18/19 138/68   . Pharmacist Clinical Goal(s): o Over the next 90 days, patient will work with PharmD and providers to maintain BP goal <130/80 . Current regimen:  o Chlorthalidone 25 mg daily  o Clonidine 0.3 mg bid . Interventions: o Reviewed home blood pressure readings.  o Discussed patient's increased sleepiness for 1 hour after taking medication.  o Encouraged patient to continue with healthy diet and exercise.  . Patient self care activities - Over the next 90 days, patient will: o Check BP weekly, document, and provide at future appointments o Ensure daily salt intake < 2300 mg/day  Hyperlipidemia Lab Results  Component Value Date/Time   LDLCALC 94 12/26/2019 09:21 AM   . Pharmacist Clinical Goal(s): o Over the next 90 days, patient will work with PharmD and providers to achieve LDL goal < 70 mg/dL . Current regimen:  o Diet/lifestyle . Interventions: o Reviewed recent lab results.  o Discussed healthy diet and exercise recommendations.  o Reviewed options for medication management of cholesterol.  . Patient self care activities - Over the next 90 days, patient will: o Begin taking fenofibrate daily if provider approves.  o Consume a diet rich in vegetables, fruit, lean meats and whole grains.   Prediabetes Lab Results  Component Value Date/Time   HGBA1C 5.5 12/26/2019 09:21 AM   HGBA1C 6.1 (H) 04/06/2019 10:00 AM   . Pharmacist Clinical Goal(s): o Over the next 90 days, patient will work with PharmD and providers to maintain A1c goal <6.5% . Current regimen:  o Diet and  lifestyle . Interventions: o Reviewed diet and lifestyle modifications to keep good blood sugar control.  . Patient self care activities - Over the next 90 days, patient will: o Continue to work on healthy diet and activity to manage blood sugar.   Afib . Pharmacist Clinical Goal(s) o Over the next 90 days, patient will work with PharmD and providers to control Afib and reduce risk of stroke . Current regimen:  o Metoprolol 50 mg bid o Eliquis 5 mg bid . Interventions: o Discussed reasoning for lifelong Eliquis treatment.  o Reviewed criteria for patient assistance but patient is not eligible.  . Patient self care activities - Over the next 90 days, patient will: o Continue taking Eliquis each day as prescribed.   Medication management . Pharmacist Clinical Goal(s): o Over the next 90 days, patient will work with PharmD and providers to maintain optimal medication adherence . [facility] . Interventions o Comprehensive medication  review performed. o Continue current medication management strategy . Patient self care activities - Over the next 90 days, patient will: o Focus on medication adherence by using pill box.  o Take medications as prescribed o Report any questions or concerns to PharmD and/or provider(s)  Initial goal documentation        AFIB   Patient is currently rate controlled. HR 56 BPM  Vitals with BMI 12/26/2019 09/19/2019 04/18/2019  Height _0  _1  -  Weight 178 lbs 10 oz 175 lbs -  BMI 32.35 57.32 -  Systolic 202 542 706  Diastolic 60 70 68  Pulse 56 63 80   Patient has failed these meds in past: n/a Patient is currently controlled on the following medications:   Metoprolol 50 mg bid   Eliquis 5 mg bid   We discussed:  Afib started after COVID. Reports good control at this time. Eliquis is expensive but patient is not eligible for patient assistance. Patient can afford the expensive copay but was hoping to return to Plavix  if just as effective. Pharmacist reviewed Dr. Alyse Low recommendation to continue Eliquis lifelong due to increased risk of stroke and DVT. Patient understands and will continue with Eliquis.   Plan  Continue current medications  ,  Prediabetes   Recent Relevant Labs: Lab Results  Component Value Date/Time   HGBA1C 5.5 12/26/2019 09:21 AM   HGBA1C 6.1 (H) 04/06/2019 10:00 AM     Patient has failed these meds in past: n/a Patient is currently controlled on the following medications: diet and lifestyle  We discussed: diet and exercise extensively  Plan  Continue control with diet and exercise and  Hypertension   BP today is:  <130/80  Office blood pressures are  BP Readings from Last 3 Encounters:  12/26/19 108/60  09/19/19 110/70  04/18/19 138/68    Patient has failed these meds in the past: none reported Patient is currently controlled on the following medications:   Chlorthalidone 25 mg daily  Clonidine 0.3 mg bid  Patient checks BP at home weekly  Patient home BP readings are ranging: ~110/60  We discussed diet and exercise extensively. Patient has lost ~10 lbs around in the past year. Starting to drink a protein shake in the morning, lunch and sensible meal at supper.   Patient reports that she feels tired for about 1 hour after taking BP medications. She is really pleased with her good blood pressure readings. Pharmacist discussed monitoring for lightheadedness or using caution to avoid falls if bp drops low. Patient reports understanding. Pharmacist will discuss with Dr. Tobie Poet to ensure no medication adjustments needed. Patient reports previously her blood pressure was alarmingly high during visits.   Plan  Continue current medications but pharmacist will discuss lower blood pressure readings with Dr. Tobie Poet.   Hyperlipidemia   LDL goal < 70  Lipid Panel     Component Value Date/Time   CHOL 158 12/26/2019 0921   TRIG 158 (H) 12/26/2019 0921   HDL 36 (L)  12/26/2019 0921   LDLCALC 94 12/26/2019 0921    Hepatic Function Latest Ref Rng & Units 12/26/2019 09/12/2019 04/18/2019  Total Protein 6.0 - 8.5 g/dL 6.4 6.5 4.7(L)  Albumin 3.7 - 4.7 g/dL 4.1 4.0 2.8(L)  AST 0 - 40 IU/L 15 20 46(H)  ALT 0 - 32 IU/L 13 10 70(H)  Alk Phosphatase 44 - 121 IU/L 99 92 64  Total Bilirubin 0.0 - 1.2 mg/dL 0.6 0.9 1.2  The ASCVD Risk score Mikey Bussing DC Jr., et al., 2013) failed to calculate for the following reasons:   The patient has a prior MI or stroke diagnosis   Patient has failed these meds in past: fenofibrate Patient is currently uncontrolled on the following medications:  . Diet/lifestyle  We discussed:  diet and exercise extensively.   Diet - eats on the run a lot. Doesn't eat the foods she should. Tries to stay healthy. 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 Eats 2 main meals a day with a light snack.   Exercise - walks 2 dogs daily.   Patient reports that she was previously taking fenofibrate for cholesterol management but stopped due to cost years ago. She would be interested in resuming treatment with fenofibrate due to slightly elevated cholesterol. Patient would like to avoid a statin due to risk of muscle pain and weakness associated. Her husband did not tolerate and patietn does not want to try at this time. Pharmacist educated patient on the benefits of statin but patient prefers to resume fenofibrate if possible. Pharmacist will discuss with Dr. Tobie Poet.   Plan  Consider beginning cholesterol lowering medication (fenofibrate per patient's request).   Hypothyroidism   Lab Results  Component Value Date/Time   TSH 3.860 09/12/2019 12:00 AM   TSH 5.912 (H) 04/18/2019 05:30 AM    Patient has failed these meds in past: n/a Patient is currently controlled on the following medications:  . Levothyroxine 50 mcg daily  We discussed:  diet and exercise extensively  Plan  Continue current medications  Osteopenia / Osteoporosis     Vit D, 25-Hydroxy  Date Value Ref Range Status  12/26/2019 28.0 (L) 30.0 - 100.0 ng/mL Final    Comment:    Vitamin D deficiency has been defined by the [place] practice guideline as a level of serum 25-OH vitamin D less than 20 ng/mL (1,2). The Endocrine Society went on to further define vitamin D insufficiency as a level between 21 and 29 ng/mL (2). 1. IOM (Institute of Medicine). 2010. Dietary reference    intakes for calcium and D. [place]: The    [employer]. 2. Holick MF, Binkley [place], Bischoff-Ferrari HA, et al.    Evaluation, treatment, and prevention of vitamin D    deficiency: an Endocrine Society clinical practice    guideline. JCEM. 2011 Jul; 96(7):1911-30.      Patient has failed these meds in past: n/a Patient is currently uncontrolled on the following medications:  Marland Kitchen Vitamin D 1000 daily   We discussed:  Recommend [phone] units of vitamin D daily. Recommend 1200 mg of calcium daily from dietary and supplemental sources. Recommend weight-bearing and muscle strengthening exercises for building and maintaining bone density.   Diet: Patient doesn't drink milk regularly. Eats a lot of salads. Likes salmon. Beginning to drink protein shake which has calcium in it as well. Discussed setting goal of 1200 mg calcium each day in diet. If unable to get in diet recommended supplementing with oral calcium supplement.   Discussed risks of low vitamin D. Patient is willing to supplement with prescription strength vitamin D 50,000 units weekly again if Dr. Tobie Poet prefers. Pharmacist will discuss with Dr. Tobie Poet and update patient accordingly.   Plan  Consider prescription Vitamin D orincreasing OTC Vitamin D supplement.   Allergic Rhinitis   Patient has failed these meds in past: cetirizine  Patient is currently controlled on the following medications:  .  Fluticasone 50 mcg/act 2 sprays into both nostrils at bedtime  We  discussed:  Patient reports good control of symptoms at this time. Patient is currently taking a holiday from cetirizine but will begin antihistamine again if needed.   Plan  Continue current medications    Health Maintenance   Patient is currently controlled on the following medications:  . diphenoxylate-atropine 2.50-0.024 mg qid prn diarrhea or loose stools . Folic acid 1 mg daily - supplementation . Chewable C with [place] daily - supplementation . Zinc daily - supplementation . Artificial tears qid prn dry eyes  We discussed:  Patient reports good control of symptoms. Rarely has diarrhea needing medication at this time.   Plan  Continue current medications  Vaccines   Reviewed and discussed patient's vaccination history. Patient has not had Shingrix vaccine. Patient would consider Moderna third shot when available. Patient has had both Pneumonia vaccines. Does not think she is up to date on Tetanus shot. Dr. Tobie Poet had given her a prescription to get it at the pharmacy. .   Immunization History  Administered Date(s) Administered  . Fluad Quad(high Dose 65+) 12/26/2019  . Moderna SARS-COVID-2 Vaccination 05/20/2019, 06/17/2019    Plan  Recommended patient receive TDAP and Shingrix vaccine in pharmacy.   Medication Management   [facility] Uses pill box? Yes Fill history: <5 day gap   We discussed: Current pharmacy is preferred with insurance plan and patient is satisfied with pharmacy services  Plan  Continue current medication management strategy    Follow up: 4 month phone visit

## 2020-01-01 ENCOUNTER — Encounter: Payer: Self-pay | Admitting: Family Medicine

## 2020-01-03 ENCOUNTER — Ambulatory Visit: Payer: Medicare HMO

## 2020-01-03 ENCOUNTER — Other Ambulatory Visit: Payer: Self-pay

## 2020-01-03 DIAGNOSIS — R7303 Prediabetes: Secondary | ICD-10-CM

## 2020-01-03 DIAGNOSIS — E782 Mixed hyperlipidemia: Secondary | ICD-10-CM

## 2020-01-03 DIAGNOSIS — I10 Essential (primary) hypertension: Secondary | ICD-10-CM

## 2020-01-03 DIAGNOSIS — I4891 Unspecified atrial fibrillation: Secondary | ICD-10-CM

## 2020-01-03 NOTE — Patient Instructions (Addendum)
Visit Information  Thank you for your time discussing your medications. I look forward to working with you to achieve your health care goals. Below is a summary of what we talked about during our visit.   Goals Addressed            This Visit's Progress   . Pharmacy Care Plan       CARE PLAN ENTRY (see longitudinal plan of care for additional care plan information)  Current Barriers:  . Chronic Disease Management support, education, and care coordination needs related to Hypertension, Hyperlipidemia, and Atrial Fibrillation   Hypertension BP Readings from Last 3 Encounters:  12/26/19 108/60  09/19/19 110/70  04/18/19 138/68   . Pharmacist Clinical Goal(s): o Over the next 90 days, patient will work with PharmD and providers to maintain BP goal <130/80 . Current regimen:  o Chlorthalidone 25 mg daily  o Clonidine 0.3 mg bid . Interventions: o Reviewed home blood pressure readings.  o Discussed patient's increased sleepiness for 1 hour after taking medication.  o Encouraged patient to continue with healthy diet and exercise.  . Patient self care activities - Over the next 90 days, patient will: o Check BP weekly, document, and provide at future appointments o Ensure daily salt intake < 2300 mg/day  Hyperlipidemia Lab Results  Component Value Date/Time   LDLCALC 94 12/26/2019 09:21 AM   . Pharmacist Clinical Goal(s): o Over the next 90 days, patient will work with PharmD and providers to achieve LDL goal < 70 mg/dL . Current regimen:  o Diet/lifestyle . Interventions: o Reviewed recent lab results.  o Discussed healthy diet and exercise recommendations.  o Reviewed options for medication management of cholesterol.  . Patient self care activities - Over the next 90 days, patient will: o Begin taking fenofibrate daily if provider approves.  o Consume a diet rich in vegetables, fruit, lean meats and whole grains.   Prediabetes Lab Results  Component Value Date/Time    HGBA1C 5.5 12/26/2019 09:21 AM   HGBA1C 6.1 (H) 04/06/2019 10:00 AM   . Pharmacist Clinical Goal(s): o Over the next 90 days, patient will work with PharmD and providers to maintain A1c goal <6.5% . Current regimen:  o Diet and lifestyle . Interventions: o Reviewed diet and lifestyle modifications to keep good blood sugar control.  . Patient self care activities - Over the next 90 days, patient will: o Continue to work on healthy diet and activity to manage blood sugar.   Afib . Pharmacist Clinical Goal(s) o Over the next 90 days, patient will work with PharmD and providers to control Afib and reduce risk of stroke . Current regimen:  o Metoprolol 50 mg bid o Eliquis 5 mg bid . Interventions: o Discussed reasoning for lifelong Eliquis treatment.  o Reviewed criteria for patient assistance but patient is not eligible.  . Patient self care activities - Over the next 90 days, patient will: o Continue taking Eliquis each day as prescribed.   Medication management . Pharmacist Clinical Goal(s): o Over the next 90 days, patient will work with PharmD and providers to maintain optimal medication adherence . Current pharmacy: [facility] . Interventions o Comprehensive medication review performed. o Continue current medication management strategy . Patient self care activities - Over the next 90 days, patient will: o Focus on medication adherence by using pill box.  o Take medications as prescribed o Report any questions or concerns to PharmD and/or provider(s)  Initial goal documentation  Cynthia Gardner was given information about Chronic Care Management services today including:  1. CCM service includes personalized support from designated clinical staff supervised by her physician, including individualized plan of care and coordination with other care providers 2. 24/7 contact phone numbers for assistance for urgent and routine care needs. 3. Standard insurance,  coinsurance, copays and deductibles apply for chronic care management only during months in which we provide at least 20 minutes of these services. Most insurances cover these services at 100%, however patients may be responsible for any copay, coinsurance and/or deductible if applicable. This service may help you avoid the need for more expensive face-to-face services. 4. Only one practitioner may furnish and bill the service in a calendar month. 5. The patient may stop CCM services at any time (effective at the end of the month) by phone call to the office staff.  Patient agreed to services and verbal consent obtained.   The patient verbalized understanding of instructions provided today and agreed to receive a mailed copy of patient instruction and/or educational materials. Telephone follow up appointment with pharmacy team member scheduled for: 05/2020  [name], PharmD Clinical Pharmacist [facility] [phone] (office) [phone] (mobile)  DASH Eating Plan DASH stands for "Dietary Approaches to Stop Hypertension." The DASH eating plan is a healthy eating plan that has been shown to reduce high blood pressure (hypertension). It may also reduce your risk for type 2 diabetes, heart disease, and stroke. The DASH eating plan may also help with weight loss. What are tips for following this plan?  General guidelines  Avoid eating more than 2,300 mg (milligrams) of salt (sodium) a day. If you have hypertension, you may need to reduce your sodium intake to 1,500 mg a day.  Limit alcohol intake to no more than 1 drink a day for nonpregnant women and 2 drinks a day for men. One drink equals 12 oz of beer, 5 oz of wine, or 1 oz of hard liquor.  Work with your health care provider to maintain a healthy body weight or to lose weight. Ask what an ideal weight is for you.  Get at least 30 minutes of exercise that causes your heart to beat faster (aerobic exercise) most days of  the week. Activities may include walking, swimming, or biking.  Work with your health care provider or diet and nutrition specialist (dietitian) to adjust your eating plan to your individual calorie needs. Reading food labels   Check food labels for the amount of sodium per serving. Choose foods with less than 5 percent of the Daily Value of sodium. Generally, foods with less than 300 mg of sodium per serving fit into this eating plan.  To find whole grains, look for the word "whole" as the first word in the ingredient list. Shopping  Buy products labeled as "low-sodium" or "no salt added."  Buy fresh foods. Avoid canned foods and premade or frozen meals. Cooking  Avoid adding salt when cooking. Use salt-free seasonings or herbs instead of table salt or sea salt. Check with your health care provider or pharmacist before using salt substitutes.  Do not fry foods. Cook foods using healthy methods such as baking, boiling, grilling, and broiling instead.  Cook with heart-healthy oils, such as olive, canola, soybean, or sunflower oil. Meal planning  Eat a balanced diet that includes: ? 5 or more servings of fruits and vegetables each day. At each meal, try to fill half of your plate with fruits and vegetables. ?  Up to 6-8 servings of whole grains each day. ? Less than 6 oz of lean meat, poultry, or fish each day. A 3-oz serving of meat is about the same size as a deck of cards. One egg equals 1 oz. ? 2 servings of low-fat dairy each day. ? A serving of nuts, seeds, or beans 5 times each week. ? Heart-healthy fats. Healthy fats called Omega-3 fatty acids are found in foods such as flaxseeds and coldwater fish, like sardines, salmon, and mackerel.  Limit how much you eat of the following: ? Canned or prepackaged foods. ? Food that is high in trans fat, such as fried foods. ? Food that is high in saturated fat, such as fatty meat. ? Sweets, desserts, sugary drinks, and other foods with  added sugar. ? Full-fat dairy products.  Do not salt foods before eating.  Try to eat at least 2 vegetarian meals each week.  Eat more home-cooked food and less restaurant, buffet, and fast food.  When eating at a restaurant, ask that your food be prepared with less salt or no salt, if possible. What foods are recommended? The items listed may not be a complete list. Talk with your dietitian about what dietary choices are best for you. Grains Whole-grain or whole-wheat bread. Whole-grain or whole-wheat pasta. Neel Buffone rice. Modena Morrow. Bulgur. Whole-grain and low-sodium cereals. Pita bread. Low-fat, low-sodium crackers. Whole-wheat flour tortillas. Vegetables Fresh or frozen vegetables (raw, steamed, roasted, or grilled). Low-sodium or reduced-sodium tomato and vegetable juice. Low-sodium or reduced-sodium tomato sauce and tomato paste. Low-sodium or reduced-sodium canned vegetables. Fruits All fresh, dried, or frozen fruit. Canned fruit in natural juice (without added sugar). Meat and other protein foods Skinless chicken or [place]. Ground chicken or [place]. Pork with fat trimmed off. Fish and seafood. Egg whites. Dried beans, peas, or lentils. Unsalted nuts, nut butters, and seeds. Unsalted canned beans. Lean cuts of beef with fat trimmed off. Low-sodium, lean deli meat. Dairy Low-fat (1%) or fat-free (skim) milk. Fat-free, low-fat, or reduced-fat cheeses. Nonfat, low-sodium ricotta or cottage cheese. Low-fat or nonfat yogurt. Low-fat, low-sodium cheese. Fats and oils Soft margarine without trans fats. Vegetable oil. Low-fat, reduced-fat, or light mayonnaise and salad dressings (reduced-sodium). Canola, safflower, olive, soybean, and sunflower oils. Avocado. Seasoning and other foods Herbs. Spices. Seasoning mixes without salt. Unsalted popcorn and pretzels. Fat-free sweets. What foods are not recommended? The items listed may not be a complete list. Talk with your dietitian about what  dietary choices are best for you. Grains Baked goods made with fat, such as croissants, muffins, or some breads. Dry pasta or rice meal packs. Vegetables Creamed or fried vegetables. Vegetables in a cheese sauce. Regular canned vegetables (not low-sodium or reduced-sodium). Regular canned tomato sauce and paste (not low-sodium or reduced-sodium). Regular tomato and vegetable juice (not low-sodium or reduced-sodium). Angie Fava. Olives. Fruits Canned fruit in a light or heavy syrup. Fried fruit. Fruit in cream or butter sauce. Meat and other protein foods Fatty cuts of meat. Ribs. Fried meat. Berniece Salines. Sausage. Bologna and other processed lunch meats. Salami. Fatback. Hotdogs. Bratwurst. Salted nuts and seeds. Canned beans with added salt. Canned or smoked fish. Whole eggs or egg yolks. Chicken or [place] with skin. Dairy Whole or 2% milk, cream, and half-and-half. Whole or full-fat cream cheese. Whole-fat or sweetened yogurt. Full-fat cheese. Nondairy creamers. Whipped toppings. Processed cheese and cheese spreads. Fats and oils Butter. Stick margarine. Lard. Shortening. Ghee. Bacon fat. Tropical oils, such as coconut, palm kernel, or palm oil.  Seasoning and other foods Salted popcorn and pretzels. Onion salt, garlic salt, seasoned salt, table salt, and sea salt. Worcestershire sauce. Tartar sauce. Barbecue sauce. Teriyaki sauce. Soy sauce, including reduced-sodium. Steak sauce. Canned and packaged gravies. Fish sauce. Oyster sauce. Cocktail sauce. Horseradish that you find on the shelf. Ketchup. Mustard. Meat flavorings and tenderizers. Bouillon cubes. Hot sauce and Tabasco sauce. Premade or packaged marinades. Premade or packaged taco seasonings. Relishes. Regular salad dressings. Where to find more information:  National Heart, Lung, and [place]: https://wilson-eaton.com/  American Heart Association: www.heart.org Summary  The DASH eating plan is a healthy eating plan that has been shown to reduce  high blood pressure (hypertension). It may also reduce your risk for type 2 diabetes, heart disease, and stroke.  With the DASH eating plan, you should limit salt (sodium) intake to 2,300 mg a day. If you have hypertension, you may need to reduce your sodium intake to 1,500 mg a day.  When on the DASH eating plan, aim to eat more fresh fruits and vegetables, whole grains, lean proteins, low-fat dairy, and heart-healthy fats.  Work with your health care provider or diet and nutrition specialist (dietitian) to adjust your eating plan to your individual calorie needs. This information is not intended to replace advice given to you by your health care provider. Make sure you discuss any questions you have with your health care provider. Document Revised: 02/27/2017 Document Reviewed: 03/10/2016 Elsevier Patient Education  2020 [employer].

## 2020-01-11 ENCOUNTER — Telehealth: Payer: Self-pay

## 2020-01-11 NOTE — Telephone Encounter (Signed)
-----   Message from Rochel Brome, MD sent at 01/09/2020 12:08 AM EDT ----- I would recommend her cut the clonidine in 1/2 unless she is willing to have [place] send her a new rx.  I would increase otc vitamin d to 5000 u daily (otc to avoid donut hole.) Her cholesterol was only slightly high. I would recommend diet and exercise over medicines at this time.Marland Kitchen  Kc  ----- Message ----- From: Burnice Logan, [facility] Sent: 01/03/2020   9:48 AM EDT To: Rochel Brome, MD  Dr. Tobie Poet,   Patient reports that she is very drowsy for 1 hour after taking bp medication. When she takes bp it is around 108/60 mmHg. She states otherwise she feels fine. Patient denies dizziness or falls.  She is happy with lower bp compared to previous high readings in years past. Wanted to let you know.  Patient's recent vitamin D is low with daily 1000 unit supplementation. She is willing to resume prescription strength vitamin d or can increase OTC supplementation if you prefer.   Patient discussed cholesterol results. We discussed the option of a statin but patient has concerns with muscle pain/weakness.She would like to consider resuming fenofibrate if you agree. She states that it worked well for her in the past.   Thank you,  Emeterio Reeve

## 2020-01-11 NOTE — Chronic Care Management (AMB) (Signed)
Left Cynthia Gardner a message with Dr. Alyse Low recommendation to cut clonidine dose in half twice daily to avoid drowsiness.   Pharmacist will follow-up with her to monitor blood pressure in 1 month.   [name], PharmD, [facility] Clinical Pharmacist [facility] [phone] (office) [phone] (mobile)

## 2020-01-13 ENCOUNTER — Other Ambulatory Visit: Payer: Self-pay | Admitting: Family Medicine

## 2020-01-13 MED ORDER — POTASSIUM CHLORIDE ER 10 MEQ PO CPCR: ORAL_CAPSULE | ORAL | 1 refills | Status: DC

## 2020-01-26 ENCOUNTER — Telehealth: Payer: Self-pay

## 2020-01-26 NOTE — Chronic Care Management (AMB) (Signed)
    Chronic Care Management Pharmacy Assistant   Name: Cynthia Gardner  MRN: [id number] DOB: January 02, 1946  Reason for Encounter: Medication Review - Medication and Total Gaps - All Measures Adherence.   PCP : Rochel Brome, MD  Allergies:  No Known Allergies  Medications: Outpatient Encounter Medications as of 01/26/2020  Medication Sig  . apixaban (ELIQUIS) 5 MG TABS tablet Take 1 tablet (5 mg total) by mouth 2 (two) times daily.  . cetirizine (ZYRTEC) 10 MG tablet TAKE 1 TABLET BY MOUTH EVERY DAY (Patient not taking: Reported on 01/03/2020)  . chlorthalidone (HYGROTON) 25 MG tablet TAKE 1 TABLET BY MOUTH EVERY DAY  . Cholecalciferol (VITAMIN D3 PO) Take 1 tablet by mouth daily.  . cloNIDine (CATAPRES) 0.3 MG tablet TAKE 1 TABLET TWICE A DAY  . [facility] CHEWABLE C WITH ROSE HIPS 500 MG CHEW TAKE 1 TABLET BY MOUTH EVERY DAY  . diphenoxylate-atropine (LOMOTIL) 2.5-0.025 MG tablet Take 1 tablet by mouth 4 (four) times daily as needed for diarrhea or loose stools.  . fluticasone (FLONASE) 50 MCG/ACT nasal spray Place 2 sprays into both nostrils at bedtime.   . folic acid (FOLVITE) 1 MG tablet TAKE 1 TABLET BY MOUTH EVERY DAY  . hydroxypropyl methylcellulose / hypromellose (ISOPTO TEARS / GONIOVISC) 2.5 % ophthalmic solution Place 1 drop into both eyes 4 (four) times daily as needed for dry eyes.  Marland Kitchen levothyroxine (SYNTHROID) 50 MCG tablet Take 1 tablet (50 mcg total) by mouth daily.  . metoprolol tartrate (LOPRESSOR) 50 MG tablet Take 1 tablet (50 mg total) by mouth 2 (two) times daily.  . Multiple Vitamins-Minerals (ZINC PO) Take 1 tablet by mouth daily. (Patient not taking: Reported on 01/03/2020)  . potassium chloride (MICRO-K) 10 MEQ CR capsule TAKE 4 CAPSULES BY MOUTH EVERY DAY  . Vitamin D, Ergocalciferol, (DRISDOL) 1.25 MG (50000 UNIT) CAPS capsule TAKE 1 CAPSULE 2 TIMES A WEEK (Patient not taking: Reported on 01/03/2020)  . VITAMIN E PO Take 1 capsule by mouth daily. (Patient not taking: Reported  on 01/03/2020)  . Zinc Sulfate (ZINC-220 PO) Take by mouth.   No facility-administered encounter medications on file as of 01/26/2020.    Current Diagnosis: Patient Active Problem List   Diagnosis Date Noted  . Mixed hyperlipidemia 09/19/2019  . Prediabetes 09/19/2019  . HTN (hypertension) 04/06/2019  . Hypothyroidism 04/06/2019  . CLL (chronic lymphocytic leukemia) ([place]) 04/06/2019  . CVA (cerebral vascular accident) ([place]) 04/06/2019  . Atrial fibrillation with RVR ([place]) 04/06/2019  . Antiphospholipid syndrome ([place]) 03/05/2017    Follow-Up:  Pharmacist Review - Reviewed chart and adherence measures. Per [employer], patient has three(3) total gaps - all measures . 1. Colorectal Cancer Screening  2. Wellness Bundle 3. Annual Wellness Visit.  Elly Modena Owens Shark, [place], Notified   Judithann Sheen, [facility] Clinical Pharmacist Assistant [phone]

## 2020-02-09 ENCOUNTER — Other Ambulatory Visit: Payer: Self-pay | Admitting: Physician Assistant

## 2020-03-09 ENCOUNTER — Telehealth (INDEPENDENT_AMBULATORY_CARE_PROVIDER_SITE_OTHER): Payer: Medicare HMO | Admitting: Nurse Practitioner

## 2020-03-09 DIAGNOSIS — J019 Acute sinusitis, unspecified: Secondary | ICD-10-CM

## 2020-03-09 DIAGNOSIS — I482 Chronic atrial fibrillation, unspecified: Secondary | ICD-10-CM | POA: Diagnosis not present

## 2020-03-09 DIAGNOSIS — Z7901 Long term (current) use of anticoagulants: Secondary | ICD-10-CM | POA: Diagnosis not present

## 2020-03-09 DIAGNOSIS — D849 Immunodeficiency, unspecified: Secondary | ICD-10-CM

## 2020-03-09 DIAGNOSIS — D6861 Antiphospholipid syndrome: Secondary | ICD-10-CM

## 2020-03-09 DIAGNOSIS — Z8673 Personal history of transient ischemic attack (TIA), and cerebral infarction without residual deficits: Secondary | ICD-10-CM

## 2020-03-09 DIAGNOSIS — C9110 Chronic lymphocytic leukemia of B-cell type not having achieved remission: Secondary | ICD-10-CM | POA: Diagnosis not present

## 2020-03-09 MED ORDER — AZITHROMYCIN 250 MG PO TABS: ORAL_TABLET | ORAL | 0 refills | Status: DC

## 2020-03-09 NOTE — Progress Notes (Addendum)
Virtual Visit via Telephone Note   This visit type was conducted due to national recommendations for restrictions regarding the COVID-19 Pandemic (e.g. social distancing) in an effort to limit this patient's exposure and mitigate transmission in our community.  Due to her co-morbid illnesses, this patient is at least at moderate risk for complications without adequate follow up.  This format is felt to be most appropriate for this patient at this time.  The patient did not have access to video technology/had technical difficulties with video requiring transitioning to audio format only (telephone).  All issues noted in this document were discussed and addressed.  No physical exam could be performed with this format.  Patient verbally consented to a telehealth visit.   Date:  03/09/2020   ID:  Cynthia Gardner, DOB Mar 05, 1946, MRN [id number]  Patient Location: Home Provider Location: Office/Clinic  PCP:  Rochel Brome, MD   Evaluation Performed:  Established patient, acute telemedicine visit  Chief Complaint: Cough  History of Present Illness:    Cynthia Gardner is a 74 y.o. female with URI symptoms which started yesterday. Feels some what better this a.m.Cough started yesterday and reports it is productive and milky in color. Has not been taking any medication for her symptoms. States her husband had a negative COVID19 test last week.  She has past medical history of chronic lymphocytic leukemia, a-fib, hypertension, previous CVA, hypothyroidism, and antiphospholipid syndrome.  Patient stated she lost her brother yesterday.  The patient does have symptoms concerning for COVID-19 infection (fever, chills, cough, or new shortness of breath).    Past Medical History:  Diagnosis Date  . Antiphospholipid antibody syndrome ([place])   . CVA (cerebral vascular accident) ([place]) 04/06/2019  . History of COVID-19   . HTN (hypertension) 04/06/2019  . Hypothyroidism 04/06/2019  . Leukemia [facility])     Past  Surgical History:  Procedure Laterality Date  . CATARACT EXTRACTION Right   . COLON RESECTION  2008   ischemic bowel    Family History  Problem Relation Age of Onset  . Parkinson's disease Father   . Cirrhosis Brother        from agent orange  . Hypertension Other   . Diabetes Other     Social History   Socioeconomic History  . Marital status: Married    Spouse name: Not on file  . Number of children: 2  . Years of education: Not on file  . Highest education level: Not on file  Occupational History  . Not on file  Tobacco Use  . Smoking status: Former Research scientist (life sciences)  . Smokeless tobacco: Never Used  Vaping Use  . Vaping Use: Never used  Substance and Sexual Activity  . Alcohol use: Not Currently  . Drug use: Never  . Sexual activity: Not Currently    Birth control/protection: None  Other Topics Concern  . Not on file  Social History Narrative  . Not on file   Social Determinants of Health   Financial Resource Strain: Low Risk   . Difficulty of Paying Living Expenses: Not hard at all  Food Insecurity: No Food Insecurity  . Worried About Charity fundraiser in the Last Year: Never true  . Ran Out of Food in the Last Year: Never true  Transportation Needs: No Transportation Needs  . Lack of Transportation (Medical): No  . Lack of Transportation (Non-Medical): No  Physical Activity: Not on file  Stress: Not on file  Social Connections: Not on file  Intimate  Partner Violence: Not on file    Outpatient Medications Prior to Visit  Medication Sig Dispense Refill  . apixaban (ELIQUIS) 5 MG TABS tablet Take 1 tablet (5 mg total) by mouth 2 (two) times daily. 60 tablet 0  . cetirizine (ZYRTEC) 10 MG tablet TAKE 1 TABLET BY MOUTH EVERY DAY (Patient not taking: Reported on 01/03/2020) 90 tablet 3  . chlorthalidone (HYGROTON) 25 MG tablet TAKE 1 TABLET BY MOUTH EVERY DAY 90 tablet 1  . Cholecalciferol (VITAMIN D3 PO) Take 1 tablet by mouth daily.    . cloNIDine (CATAPRES) 0.3  MG tablet TAKE 1 TABLET TWICE A DAY 180 tablet 1  . CVS CHEWABLE C WITH ROSE HIPS 500 MG CHEW TAKE 1 TABLET BY MOUTH EVERY DAY 100 tablet 1  . diphenoxylate-atropine (LOMOTIL) 2.5-0.025 MG tablet Take 1 tablet by mouth 4 (four) times daily as needed for diarrhea or loose stools. 60 tablet 2  . fluticasone (FLONASE) 50 MCG/ACT nasal spray Place 2 sprays into both nostrils at bedtime.     . folic acid (FOLVITE) 1 MG tablet TAKE 1 TABLET BY MOUTH EVERY DAY 90 tablet 1  . hydroxypropyl methylcellulose / hypromellose (ISOPTO TEARS / GONIOVISC) 2.5 % ophthalmic solution Place 1 drop into both eyes 4 (four) times daily as needed for dry eyes.    Marland Kitchen levothyroxine (SYNTHROID) 50 MCG tablet Take 1 tablet (50 mcg total) by mouth daily. 30 tablet 6  . metoprolol tartrate (LOPRESSOR) 50 MG tablet Take 1 tablet (50 mg total) by mouth 2 (two) times daily. 60 tablet 0  . Multiple Vitamins-Minerals (ZINC PO) Take 1 tablet by mouth daily. (Patient not taking: Reported on 01/03/2020)    . potassium chloride (MICRO-K) 10 MEQ CR capsule TAKE 4 CAPSULES BY MOUTH EVERY DAY 360 capsule 1  . Vitamin D, Ergocalciferol, (DRISDOL) 1.25 MG (50000 UNIT) CAPS capsule TAKE 1 CAPSULE 2 TIMES A WEEK (Patient not taking: Reported on 01/03/2020) 24 capsule 0  . VITAMIN E PO Take 1 capsule by mouth daily. (Patient not taking: Reported on 01/03/2020)    . Zinc Sulfate (ZINC-220 PO) Take by mouth.     No facility-administered medications prior to visit.    Allergies:   Patient has no known allergies.   Social History   Tobacco Use  . Smoking status: Former Research scientist (life sciences)  . Smokeless tobacco: Never Used  Vaping Use  . Vaping Use: Never used  Substance Use Topics  . Alcohol use: Not Currently  . Drug use: Never     Review of Systems  Constitutional: Positive for malaise/fatigue. Negative for chills and fever.  HENT: Positive for congestion, sinus pain and sore throat. Negative for ear pain.   Respiratory: Positive for cough and  sputum production (thick, clear to white). Negative for shortness of breath and wheezing.   Cardiovascular: Negative for chest pain and palpitations.       A-fib  Gastrointestinal: Negative for diarrhea, melena, nausea and vomiting.  Genitourinary: Negative for dysuria, flank pain and frequency.  Musculoskeletal: Negative for joint pain and myalgias.  Skin: Negative for itching.  Neurological: Positive for headaches. Negative for dizziness.  Endo/Heme/Allergies:       Immunocompromised  Psychiatric/Behavioral:       Lost sibling yesterday     Labs/Other Tests and Data Reviewed:    Recent Labs: 04/18/2019: B Natriuretic Peptide 52.6; Magnesium 1.8 09/12/2019: TSH 3.860 12/26/2019: ALT 13; BUN 23; Creatinine, Ser 1.19; Hemoglobin 12.1; Platelets 266; Potassium 4.8; Sodium 139   Recent Lipid  Panel Lab Results  Component Value Date/Time   CHOL 158 12/26/2019 09:21 AM   TRIG 158 (H) 12/26/2019 09:21 AM   HDL 36 (L) 12/26/2019 09:21 AM   CHOLHDL 4.4 12/26/2019 09:21 AM   LDLCALC 94 12/26/2019 09:21 AM    Wt Readings from Last 3 Encounters:  12/26/19 178 lb 9.6 oz (81 kg)  09/19/19 175 lb (79.4 kg)  04/06/19 179 lb 14.3 oz (81.6 kg)     Objective:    There were no vitals taken for this visit.  No physical exam due to telemedicine visit Physical Exam Vitals reviewed.  Neurological:     Mental Status: She is alert.      ASSESSMENT & PLAN:    1. Acute non-recurrent sinusitis, unspecified location - azithromycin (ZITHROMAX) 250 MG tablet; Take two tablets by mouth on day one Take one tablet daily by mouth day 2-5  Dispense: 6 tablet; Refill: 0  2. CLL (chronic lymphocytic leukemia) ([place])  3. Immunocompromised patient ([place]) - azithromycin (ZITHROMAX) 250 MG tablet; Take two tablets by mouth on day one Take one tablet daily by mouth day 2-5  Dispense: 6 tablet; Refill: 0  4. Chronic atrial fibrillation (HCC)  5. Long term (current) use of anticoagulants  6.  Antiphospholipid syndrome ([place])  7. History of CVA (cerebrovascular accident)     COVID-19 Education: The signs and symptoms of COVID-19 were discussed with the patient and how to seek care for testing (follow up with PCP or arrange E-visit). The importance of social distancing was discussed today.   Telephone visit: 10:08-10:12, I had a 4 minute conversation with the patient discussing symptoms, brief health history, and treatment plan. Additional time was spent reviewing past medical history.    Follow Up:  Virtual Visit  prn If symptoms worsen or fail to improve seek emergency care or notify clinic  Signed,  Jerrell Belfast, DNP 03/10/2020 at 12:04 [facility]

## 2020-03-12 ENCOUNTER — Encounter: Payer: Self-pay | Admitting: Nurse Practitioner

## 2020-03-14 ENCOUNTER — Telehealth: Payer: Self-pay

## 2020-03-14 NOTE — Telephone Encounter (Signed)
Cynthia Gardner called to report that she and Cynthia Gardner completed the antibiotic but she continues to have some congestion and cough.  She was encouraged to force fluids, and get plenty of rest. Recheck in the office if symptoms continue.

## 2020-03-16 ENCOUNTER — Other Ambulatory Visit: Payer: Self-pay | Admitting: Nurse Practitioner

## 2020-03-16 ENCOUNTER — Telehealth (INDEPENDENT_AMBULATORY_CARE_PROVIDER_SITE_OTHER): Payer: Medicare HMO | Admitting: Family Medicine

## 2020-03-16 ENCOUNTER — Encounter: Payer: Self-pay | Admitting: Family Medicine

## 2020-03-16 VITALS — BP 107/77 | HR 67 | Temp 96.6°F

## 2020-03-16 DIAGNOSIS — J208 Acute bronchitis due to other specified organisms: Secondary | ICD-10-CM | POA: Diagnosis not present

## 2020-03-16 MED ORDER — CEFDINIR 300 MG PO CAPS: 300.0000 mg | ORAL_CAPSULE | Freq: Two times a day (BID) | ORAL | 0 refills | Status: AC

## 2020-03-16 NOTE — Progress Notes (Signed)
Virtual Visit via Telephone Note   This visit type was conducted due to national recommendations for restrictions regarding the COVID-19 Pandemic (e.g. social distancing) in an effort to limit this patient's exposure and mitigate transmission in our community.  Due to her co-morbid illnesses, this patient is at least at moderate risk for complications without adequate follow up.  This format is felt to be most appropriate for this patient at this time.  The patient did not have access to video technology/had technical difficulties with video requiring transitioning to audio format only (telephone).  All issues noted in this document were discussed and addressed.  No physical exam could be performed with this format.  Patient verbally consented to a telehealth visit.   Date:  03/16/2020   ID:  Cynthia Gardner, DOB 07/04/1945, MRN [id number]  Patient Location: Home Provider Location: Office/Clinic  PCP:  Rochel Brome, MD   Evaluation Performed: acute Chief Complaint:  cough  History of Present Illness:    Cynthia Gardner is a 74 y.o. female with cough and chest congestion x 2 weeks. Some sob. No chest pain. No fever,chills, sweats. Pt was treated with a zpack. Not better yet.  Her husband was treated also. He had a negative covid test.   Past Medical History:  Diagnosis Date  . Antiphospholipid antibody syndrome ([place])   . CVA (cerebral vascular accident) ([place]) 04/06/2019  . History of COVID-19   . HTN (hypertension) 04/06/2019  . Hypothyroidism 04/06/2019  . Leukemia [facility])     Past Surgical History:  Procedure Laterality Date  . CATARACT EXTRACTION Right   . COLON RESECTION  2008   ischemic bowel    Family History  Problem Relation Age of Onset  . Parkinson's disease Father   . Cirrhosis Brother        from agent orange  . Hypertension Other   . Diabetes Other     Social History   Socioeconomic History  . Marital status: Married    Spouse name: Not on file  . Number of  children: 2  . Years of education: Not on file  . Highest education level: Not on file  Occupational History  . Not on file  Tobacco Use  . Smoking status: Former Research scientist (life sciences)  . Smokeless tobacco: Never Used  Vaping Use  . Vaping Use: Never used  Substance and Sexual Activity  . Alcohol use: Not Currently  . Drug use: Never  . Sexual activity: Not Currently    Birth control/protection: None  Other Topics Concern  . Not on file  Social History Narrative  . Not on file   Social Determinants of Health   Financial Resource Strain: Low Risk   . Difficulty of Paying Living Expenses: Not hard at all  Food Insecurity: No Food Insecurity  . Worried About Charity fundraiser in the Last Year: Never true  . Ran Out of Food in the Last Year: Never true  Transportation Needs: No Transportation Needs  . Lack of Transportation (Medical): No  . Lack of Transportation (Non-Medical): No  Physical Activity: Not on file  Stress: Not on file  Social Connections: Not on file  Intimate Partner Violence: Not on file    Outpatient Medications Prior to Visit  Medication Sig Dispense Refill  . apixaban (ELIQUIS) 5 MG TABS tablet Take 1 tablet (5 mg total) by mouth 2 (two) times daily. 60 tablet 0  . cefdinir (OMNICEF) 300 MG capsule Take 1 capsule (300 mg total) by  mouth 2 (two) times daily for 10 days. 20 capsule 0  . cetirizine (ZYRTEC) 10 MG tablet TAKE 1 TABLET BY MOUTH EVERY DAY 90 tablet 3  . chlorthalidone (HYGROTON) 25 MG tablet TAKE 1 TABLET BY MOUTH EVERY DAY 90 tablet 1  . Cholecalciferol (VITAMIN D3 PO) Take 1 tablet by mouth daily.    . cloNIDine (CATAPRES) 0.3 MG tablet TAKE 1 TABLET TWICE A DAY 180 tablet 1  . CVS CHEWABLE C WITH ROSE HIPS 500 MG CHEW TAKE 1 TABLET BY MOUTH EVERY DAY 100 tablet 1  . diphenoxylate-atropine (LOMOTIL) 2.5-0.025 MG tablet Take 1 tablet by mouth 4 (four) times daily as needed for diarrhea or loose stools. 60 tablet 2  . fluticasone (FLONASE) 50 MCG/ACT nasal  spray Place 2 sprays into both nostrils at bedtime.     . folic acid (FOLVITE) 1 MG tablet TAKE 1 TABLET BY MOUTH EVERY DAY 90 tablet 1  . hydroxypropyl methylcellulose / hypromellose (ISOPTO TEARS / GONIOVISC) 2.5 % ophthalmic solution Place 1 drop into both eyes 4 (four) times daily as needed for dry eyes.    Marland Kitchen levothyroxine (SYNTHROID) 50 MCG tablet Take 1 tablet (50 mcg total) by mouth daily. 30 tablet 6  . metoprolol tartrate (LOPRESSOR) 50 MG tablet Take 1 tablet (50 mg total) by mouth 2 (two) times daily. 60 tablet 0  . Multiple Vitamins-Minerals (ZINC PO) Take 1 tablet by mouth daily.    . potassium chloride (MICRO-K) 10 MEQ CR capsule TAKE 4 CAPSULES BY MOUTH EVERY DAY 360 capsule 1  . Vitamin D, Ergocalciferol, (DRISDOL) 1.25 MG (50000 UNIT) CAPS capsule TAKE 1 CAPSULE 2 TIMES A WEEK 24 capsule 0  . VITAMIN E PO Take 1 capsule by mouth daily.    . Zinc Sulfate (ZINC-220 PO) Take by mouth.    Marland Kitchen azithromycin (ZITHROMAX) 250 MG tablet Take two tablets by mouth on day one Take one tablet daily by mouth day 2-5 6 tablet 0   No facility-administered medications prior to visit.    Allergies:   Patient has no known allergies.   Social History   Tobacco Use  . Smoking status: Former Research scientist (life sciences)  . Smokeless tobacco: Never Used  Vaping Use  . Vaping Use: Never used  Substance Use Topics  . Alcohol use: Not Currently  . Drug use: Never     Review of Systems  Constitutional: Positive for malaise/fatigue. Negative for chills and fever.  HENT: Positive for congestion, ear pain and sinus pain. Negative for sore throat.   Respiratory: Positive for cough and sputum production. Negative for shortness of breath and wheezing.   Cardiovascular: Negative for chest pain.  Gastrointestinal: Negative for abdominal pain, constipation, diarrhea, heartburn, nausea and vomiting.  Genitourinary: Negative for dysuria, frequency, hematuria and urgency.  Musculoskeletal: Negative for back pain, joint pain  and myalgias.  Neurological: Negative for dizziness and headaches.     Labs/Other Tests and Data Reviewed:    Recent Labs: 04/18/2019: B Natriuretic Peptide 52.6; Magnesium 1.8 09/12/2019: TSH 3.860 12/26/2019: ALT 13; BUN 23; Creatinine, Ser 1.19; Hemoglobin 12.1; Platelets 266; Potassium 4.8; Sodium 139   Recent Lipid Panel Lab Results  Component Value Date/Time   CHOL 158 12/26/2019 09:21 AM   TRIG 158 (H) 12/26/2019 09:21 AM   HDL 36 (L) 12/26/2019 09:21 AM   CHOLHDL 4.4 12/26/2019 09:21 AM   LDLCALC 94 12/26/2019 09:21 AM    Wt Readings from Last 3 Encounters:  12/26/19 178 lb 9.6 oz (81 kg)  09/19/19 175 lb (79.4 kg)  04/06/19 179 lb 14.3 oz (81.6 kg)     Objective:    Vital Signs:  BP 107/77   Pulse 67   Temp (!) 96.6 F (35.9 C)   SpO2 97%    Physical Exam Vitals reviewed.      ASSESSMENT & PLAN:   1. Acute bronchitis due to other specified organisms  Rx: cefdinir 300 mg 2 daily x 10 days.  Continue mucinex.  If no improvement by Monday, recommend pt call and will get CXR.   COVID-19 Education: The signs and symptoms of COVID-19 were discussed with the patient and how to seek care for testing (follow up with PCP or arrange E-visit). The importance of social distancing was discussed today.   I spent 10 minutes dedicated to the care of this patient on the date of this encounter to include face-to-face time with the patient, as well as: review of last chart note.   Follow Up:  Virtual Visit  prn  Signed,  Rochel Brome, MD  03/16/2020 11:16 PM    [place]

## 2020-05-01 ENCOUNTER — Encounter: Payer: Self-pay | Admitting: Family Medicine

## 2020-05-01 ENCOUNTER — Telehealth (INDEPENDENT_AMBULATORY_CARE_PROVIDER_SITE_OTHER): Payer: Medicare HMO | Admitting: Family Medicine

## 2020-05-01 VITALS — BP 115/80 | HR 70 | Temp 96.6°F | Ht 66.0 in | Wt 173.0 lb

## 2020-05-01 DIAGNOSIS — D6861 Antiphospholipid syndrome: Secondary | ICD-10-CM

## 2020-05-01 DIAGNOSIS — E782 Mixed hyperlipidemia: Secondary | ICD-10-CM

## 2020-05-01 DIAGNOSIS — Z6827 Body mass index (BMI) 27.0-27.9, adult: Secondary | ICD-10-CM

## 2020-05-01 DIAGNOSIS — I4891 Unspecified atrial fibrillation: Secondary | ICD-10-CM

## 2020-05-01 DIAGNOSIS — I10 Essential (primary) hypertension: Secondary | ICD-10-CM

## 2020-05-01 DIAGNOSIS — I4819 Other persistent atrial fibrillation: Secondary | ICD-10-CM | POA: Diagnosis not present

## 2020-05-01 NOTE — Patient Instructions (Signed)
Samples provided of Eliquis 5 mg BID. Patient is to continue for A-fib.

## 2020-05-01 NOTE — Progress Notes (Signed)
Virtual Visit via Video Note   This visit type was conducted due to national recommendations for restrictions regarding the COVID-19 Pandemic (e.g. social distancing) in an effort to limit this patient's exposure and mitigate transmission in our community.  Due to her co-morbid illnesses, this patient is at least at moderate risk for complications without adequate follow up.  This format is felt to be most appropriate for this patient at this time.  All issues noted in this document were discussed and addressed.  A limited physical exam was performed with this format.  A verbal consent was obtained for the virtual visit.   Date:  05/09/2020   ID:  Cynthia Gardner, DOB [birth year], MRN [id number]  Patient Location: Home Provider Location: Office/Clinic  PCP:  Rochel Brome, MD   Evaluation Performed:  Follow-Up Visit  Chief Complaint:  Hypertension  History of Present Illness:    Cynthia Gardner is a 75 y.o. female presenting for a 3 month follow up HTN: Chlorthalidone 25 qd, Clonidine 0.3 mg one twice a day, and Toprol 50 mg one twice a day - eating healthy, enjoys bike riding. BP has been well controlled.  Hypothyroidism-Levothyroxine 50 mcg once daily Atrial fibrillation: On eliquis and toprol.  Antiphospholipid antibody syndrome with history of stroke. Taking eliquis.   Past Medical History:  Diagnosis Date  . Antiphospholipid antibody syndrome ([place])   . CVA (cerebral vascular accident) ([place]) 04/06/2019  . History of COVID-19   . HTN (hypertension) 04/06/2019  . Hypothyroidism 04/06/2019  . Leukemia [facility])     Past Surgical History:  Procedure Laterality Date  . CATARACT EXTRACTION Right   . COLON RESECTION  2008   ischemic bowel    Family History  Problem Relation Age of Onset  . Parkinson's disease Father   . Cirrhosis Brother        from agent orange  . Hypertension Other   . Diabetes Other     Social History   Socioeconomic History  . Marital status: Married    Spouse  name: Not on file  . Number of children: 2  . Years of education: Not on file  . Highest education level: Not on file  Occupational History  . Not on file  Tobacco Use  . Smoking status: Former Research scientist (life sciences)  . Smokeless tobacco: Never Used  Vaping Use  . Vaping Use: Never used  Substance and Sexual Activity  . Alcohol use: Not Currently  . Drug use: Never  . Sexual activity: Not Currently    Birth control/protection: None  Other Topics Concern  . Not on file  Social History Narrative  . Not on file   Social Determinants of Health   Financial Resource Strain: Low Risk   . Difficulty of Paying Living Expenses: Not hard at all  Food Insecurity: No Food Insecurity  . Worried About Charity fundraiser in the Last Year: Never true  . Ran Out of Food in the Last Year: Never true  Transportation Needs: No Transportation Needs  . Lack of Transportation (Medical): No  . Lack of Transportation (Non-Medical): No  Physical Activity: Not on file  Stress: Not on file  Social Connections: Not on file  Intimate Partner Violence: Not on file    Outpatient Medications Prior to Visit  Medication Sig Dispense Refill  . apixaban (ELIQUIS) 5 MG TABS tablet Take 1 tablet (5 mg total) by mouth 2 (two) times daily. 60 tablet 0  . cetirizine (ZYRTEC) 10 MG tablet TAKE  1 TABLET BY MOUTH EVERY DAY 90 tablet 3  . chlorthalidone (HYGROTON) 25 MG tablet TAKE 1 TABLET BY MOUTH EVERY DAY 90 tablet 1  . Cholecalciferol (VITAMIN D3 PO) Take 1 tablet by mouth daily.    . cloNIDine (CATAPRES) 0.3 MG tablet TAKE 1 TABLET TWICE A DAY 180 tablet 1  . CVS CHEWABLE C WITH ROSE HIPS 500 MG CHEW TAKE 1 TABLET BY MOUTH EVERY DAY 100 tablet 1  . diphenoxylate-atropine (LOMOTIL) 2.5-0.025 MG tablet Take 1 tablet by mouth 4 (four) times daily as needed for diarrhea or loose stools. 60 tablet 2  . fluticasone (FLONASE) 50 MCG/ACT nasal spray Place 2 sprays into both nostrils at bedtime.     . folic acid (FOLVITE) 1 MG tablet  TAKE 1 TABLET BY MOUTH EVERY DAY 90 tablet 1  . levothyroxine (SYNTHROID) 50 MCG tablet Take 1 tablet (50 mcg total) by mouth daily. 30 tablet 6  . metoprolol tartrate (LOPRESSOR) 50 MG tablet Take 1 tablet (50 mg total) by mouth 2 (two) times daily. 60 tablet 0  . Multiple Vitamins-Minerals (ZINC PO) Take 1 tablet by mouth daily.    . potassium chloride (MICRO-K) 10 MEQ CR capsule TAKE 4 CAPSULES BY MOUTH EVERY DAY 360 capsule 1  . Zinc Sulfate (ZINC-220 PO) Take by mouth.    . hydroxypropyl methylcellulose / hypromellose (ISOPTO TEARS / GONIOVISC) 2.5 % ophthalmic solution Place 1 drop into both eyes 4 (four) times daily as needed for dry eyes. (Patient not taking: Reported on 05/01/2020)    . Vitamin D, Ergocalciferol, (DRISDOL) 1.25 MG (50000 UNIT) CAPS capsule TAKE 1 CAPSULE 2 TIMES A WEEK 24 capsule 0  . VITAMIN E PO Take 1 capsule by mouth daily.     No facility-administered medications prior to visit.    Allergies:   Patient has no known allergies.   Social History   Tobacco Use  . Smoking status: Former Research scientist (life sciences)  . Smokeless tobacco: Never Used  Vaping Use  . Vaping Use: Never used  Substance Use Topics  . Alcohol use: Not Currently  . Drug use: Never     Review of Systems  Constitutional: Negative for chills, fever and malaise/fatigue.  HENT: Negative for ear pain, sinus pain and sore throat.        Rhinorrhea-zyrtec not helping  Respiratory: Negative for cough and shortness of breath.   Cardiovascular: Negative for chest pain.  Musculoskeletal: Negative for myalgias.  Neurological: Negative for headaches.     Labs/Other Tests and Data Reviewed:    Recent Labs: 05/07/2020: ALT 14; BUN 24; Creatinine, Ser 1.34; Hemoglobin 12.7; Platelets 299; Potassium 4.7; Sodium 141; TSH 3.260   Recent Lipid Panel Lab Results  Component Value Date/Time   CHOL 175 05/07/2020 11:40 AM   TRIG 143 05/07/2020 11:40 AM   HDL 42 05/07/2020 11:40 AM   CHOLHDL 4.2 05/07/2020 11:40 AM    LDLCALC 108 (H) 05/07/2020 11:40 AM    Wt Readings from Last 3 Encounters:  05/01/20 173 lb (78.5 kg)  12/26/19 178 lb 9.6 oz (81 kg)  09/19/19 175 lb (79.4 kg)     Objective:    Vital Signs:  BP 115/80   Pulse 70   Temp (!) 96.6 F (35.9 C)   Ht 5\' 6"  (1.676 m)   Wt 173 lb (78.5 kg)   SpO2 97%   BMI 27.92 kg/m    Physical Exam  VS removed.   ASSESSMENT & PLAN:   1. Essential hypertension Well  controlled.   2. Mixed hyperlipidemia Not at goal.   3. Persistent atrial fibrillation (HCC) Well controlled on current medications.  4. BMI 27.0-27.9,adult Recommend continue to work on eating healthy diet and exercise.  5. Antiphospholipid syndrome ([place])  Continue eliquis.   6. Acquired thrombophilia.  Needs eliquis due to antiphospholipid syndrome and atrial fibrillation.   I spent 15 minutes dedicated to the care of this patient on the date of this encounter. Follow Up:  In Person in 3 month(s) fasting.  SignedRochel Brome, MD  05/09/2020 10:45 PM    [place]

## 2020-05-07 ENCOUNTER — Other Ambulatory Visit: Payer: Medicare HMO

## 2020-05-07 ENCOUNTER — Other Ambulatory Visit: Payer: Self-pay

## 2020-05-07 DIAGNOSIS — I10 Essential (primary) hypertension: Secondary | ICD-10-CM

## 2020-05-07 DIAGNOSIS — E782 Mixed hyperlipidemia: Secondary | ICD-10-CM | POA: Diagnosis not present

## 2020-05-08 LAB — CBC WITH DIFF/PLATELET
Basophils Absolute: 0.2 10*3/uL (ref 0.0–0.2)
Basos: 1 %
EOS (ABSOLUTE): 0.5 10*3/uL — ABNORMAL HIGH (ref 0.0–0.4)
Eos: 1 %
Hematocrit: 37.8 % (ref 34.0–46.6)
Hemoglobin: 12.7 g/dL (ref 11.1–15.9)
Immature Grans (Abs): 0 10*3/uL (ref 0.0–0.1)
Immature Granulocytes: 0 %
Lymphocytes Absolute: 25.5 10*3/uL — ABNORMAL HIGH (ref 0.7–3.1)
Lymphs: 72 %
MCH: 30.8 pg (ref 26.6–33.0)
MCHC: 33.6 g/dL (ref 31.5–35.7)
MCV: 92 fL (ref 79–97)
Monocytes Absolute: 3.1 10*3/uL — ABNORMAL HIGH (ref 0.1–0.9)
Monocytes: 9 %
Neutrophils Absolute: 5.9 10*3/uL (ref 1.4–7.0)
Neutrophils: 17 %
Platelets: 299 10*3/uL (ref 150–450)
RBC: 4.12 x10E6/uL (ref 3.77–5.28)
RDW: 13.9 % (ref 11.7–15.4)
WBC: 35.2 10*3/uL (ref 3.4–10.8)

## 2020-05-08 LAB — LIPID PANEL
Chol/HDL Ratio: 4.2 ratio (ref 0.0–4.4)
Cholesterol, Total: 175 mg/dL (ref 100–199)
HDL: 42 mg/dL
LDL Chol Calc (NIH): 108 mg/dL — ABNORMAL HIGH (ref 0–99)
Triglycerides: 143 mg/dL (ref 0–149)
VLDL Cholesterol Cal: 25 mg/dL (ref 5–40)

## 2020-05-08 LAB — COMPREHENSIVE METABOLIC PANEL WITH GFR
ALT: 14 [IU]/L (ref 0–32)
AST: 21 [IU]/L (ref 0–40)
Albumin/Globulin Ratio: 1.7 (ref 1.2–2.2)
Albumin: 4.2 g/dL (ref 3.7–4.7)
Alkaline Phosphatase: 112 [IU]/L (ref 44–121)
BUN/Creatinine Ratio: 18 (ref 12–28)
BUN: 24 mg/dL (ref 8–27)
Bilirubin Total: 0.8 mg/dL (ref 0.0–1.2)
CO2: 20 mmol/L (ref 20–29)
Calcium: 9.1 mg/dL (ref 8.7–10.3)
Chloride: 108 mmol/L — ABNORMAL HIGH (ref 96–106)
Creatinine, Ser: 1.34 mg/dL — ABNORMAL HIGH (ref 0.57–1.00)
GFR calc Af Amer: 45 mL/min/{1.73_m2} — ABNORMAL LOW
GFR calc non Af Amer: 39 mL/min/{1.73_m2} — ABNORMAL LOW
Globulin, Total: 2.5 g/dL (ref 1.5–4.5)
Glucose: 105 mg/dL — ABNORMAL HIGH (ref 65–99)
Potassium: 4.7 mmol/L (ref 3.5–5.2)
Sodium: 141 mmol/L (ref 134–144)
Total Protein: 6.7 g/dL (ref 6.0–8.5)

## 2020-05-08 LAB — TSH: TSH: 3.26 u[IU]/mL (ref 0.450–4.500)

## 2020-05-08 LAB — CARDIOVASCULAR RISK ASSESSMENT

## 2020-05-09 ENCOUNTER — Encounter: Payer: Self-pay | Admitting: Family Medicine

## 2020-05-22 ENCOUNTER — Telehealth: Payer: Medicare HMO

## 2020-05-22 NOTE — Progress Notes (Deleted)
Chronic Care Management Pharmacy Note  05/22/2020 Name:  Cynthia Gardner MRN:  [id number] DOB:  01/30/1946  Subjective: Cynthia Gardner is an 75 y.o. year old female who is a primary patient of Cox, Kirsten, MD.  The CCM team was consulted for assistance with disease management and care coordination needs.    Engaged with patient by telephone for follow up visit in response to provider referral for pharmacy case management and/or care coordination services.   Consent to Services:  The patient was given information about Chronic Care Management services, agreed to services, and gave verbal consent prior to initiation of services.  Please see initial visit note for detailed documentation.   Patient Care Team: Rochel Brome, MD as PCP - General (Family Medicine) Burnice Logan, [facility] as Pharmacist (Pharmacist) Marcy Panning, MD as Referring Physician (Oncology)  Recent office visits: 05/01/2020 - Eliquis samples provided. Lipids not controlled. LDL 108 - work on healthier eating/exercise.  03/16/2020 - cefdinir 300 mg bid for 10 days. Continue mucinex. Chest x-ray if not improved by Monday.  03/09/2020 - sinusitis - zithromax rx ordered.  Recent consult visits: ***  Hospital visits: None in previous 6 months  Objective:  Lab Results  Component Value Date   CREATININE 1.34 (H) 05/07/2020   BUN 24 05/07/2020   GFRNONAA 39 (L) 05/07/2020   GFRAA 45 (L) 05/07/2020   NA 141 05/07/2020   K 4.7 05/07/2020   CALCIUM 9.1 05/07/2020   CO2 20 05/07/2020    Lab Results  Component Value Date/Time   HGBA1C 5.5 12/26/2019 09:21 AM   HGBA1C 6.1 (H) 04/06/2019 10:00 AM    Last diabetic Eye exam: No results found for: HMDIABEYEEXA  Last diabetic Foot exam: No results found for: HMDIABFOOTEX   Lab Results  Component Value Date   CHOL 175 05/07/2020   HDL 42 05/07/2020   LDLCALC 108 (H) 05/07/2020   TRIG 143 05/07/2020   CHOLHDL 4.2 05/07/2020    Hepatic Function Latest Ref Rng & Units  05/07/2020 12/26/2019 09/12/2019  Total Protein 6.0 - 8.5 g/dL 6.7 6.4 6.5  Albumin 3.7 - 4.7 g/dL 4.2 4.1 4.0  AST 0 - 40 IU/L _0 ALT 0 - 32 IU/L _1 Alk Phosphatase 44 - 121 IU/L 112 99 92  Total Bilirubin 0.0 - 1.2 mg/dL 0.8 0.6 0.9    Lab Results  Component Value Date/Time   TSH 3.260 05/07/2020 11:40 AM   TSH 3.860 09/12/2019 12:00 AM    CBC Latest Ref Rng & Units 05/07/2020 12/26/2019 09/12/2019  WBC 3.4 - 10.8 x10E3/uL 35.2(HH) 33.4(HH) 24.9(HH)  Hemoglobin 11.1 - 15.9 g/dL 12.7 12.1 12.0  Hematocrit 34.0 - 46.6 % 37.8 36.5 37.1  Platelets 150 - 450 x10E3/uL 299 266 270    Lab Results  Component Value Date/Time   VD25OH 28.0 (L) 12/26/2019 09:21 AM    Clinical ASCVD: Yes  The ASCVD Risk score Mikey Bussing DC Jr., et al., 2013) failed to calculate for the following reasons:   The patient has a prior MI or stroke diagnosis    Depression screen [facility] 2/9 09/19/2019  Decreased Interest 0  Down, Depressed, Hopeless 0  PHQ - 2 Score 0     ***Other: (CHADS2VASc if Afib, MMRC or CAT for COPD, ACT, DEXA)  Social History   Tobacco Use  Smoking Status Former Smoker  Smokeless Tobacco Never Used   BP Readings from Last 3 Encounters:  05/01/20 115/80  03/16/20 107/77  12/26/19 108/60   Pulse Readings from Last 3 Encounters:  05/01/20 70  03/16/20 67  12/26/19 (!) 56   Wt Readings from Last 3 Encounters:  05/01/20 173 lb (78.5 kg)  12/26/19 178 lb 9.6 oz (81 kg)  09/19/19 175 lb (79.4 kg)    Assessment/Interventions: Review of patient past medical history, allergies, medications, health status, including review of consultants reports, laboratory and other test data, was performed as part of comprehensive evaluation and provision of chronic care management services.   SDOH:  (Social Determinants of Health) assessments and interventions performed: Yes   CCM Care Plan  No Known Allergies  Medications Reviewed Today    Reviewed by Rochel Brome, MD (Physician) on  05/09/20 at [place] List Status: <None>  Medication Order Taking? Sig Documenting Provider Last Dose Status Informant  apixaban (ELIQUIS) 5 MG TABS tablet [id number] Yes Take 1 tablet (5 mg total) by mouth 2 (two) times daily. Cox, Kirsten, MD Taking Active   cetirizine (ZYRTEC) 10 MG tablet [id number] Yes TAKE 1 TABLET BY MOUTH EVERY DAY Cox, Kirsten, MD Taking Active   chlorthalidone (HYGROTON) 25 MG tablet [id number] Yes TAKE 1 TABLET BY MOUTH EVERY DAY Marge Duncans, PA-C Taking Active   Cholecalciferol (VITAMIN D3 PO) [id number] Yes Take 1 tablet by mouth daily. [provider] Taking Active Self  cloNIDine (CATAPRES) 0.3 MG tablet [id number] Yes TAKE 1 TABLET TWICE A DAY Cox, Kirsten, MD Taking Active   [facility]S CHEWABLE C WITH ROSE HIPS 500 MG CHEW [id number] Yes TAKE 1 TABLET BY MOUTH EVERY DAY Cox, Kirsten, MD Taking Active   diphenoxylate-atropine (LOMOTIL) 2.5-0.025 MG tablet [id number] Yes Take 1 tablet by mouth 4 (four) times daily as needed for diarrhea or loose stools. Cox, Kirsten, MD Taking Active   fluticasone [facility]) 50 MCG/ACT nasal spray [id number] Yes Place 2 sprays into both nostrils at bedtime.  [provider] Taking Active Self  folic acid (FOLVITE) 1 MG tablet [id number] Yes TAKE 1 TABLET BY MOUTH EVERY DAY Cox, Kirsten, MD Taking Active   levothyroxine (SYNTHROID) 50 MCG tablet [id number] Yes Take 1 tablet (50 mcg total) by mouth daily. Lillard Anes, MD Taking Active   metoprolol tartrate (LOPRESSOR) 50 MG tablet [id number] Yes Take 1 tablet (50 mg total) by mouth 2 (two) times daily. Thurnell Lose, MD Taking Active   Multiple Vitamins-Minerals (ZINC PO) [id number] Yes Take 1 tablet by mouth daily. [provider] Taking Active Self  potassium chloride (MICRO-K) 10 MEQ CR capsule [id number] Yes TAKE 4 CAPSULES BY MOUTH EVERY DAY Cox, Kirsten, MD Taking Active   Zinc Sulfate (ZINC-220 PO) [id number] Yes Take by mouth. [provider] Taking  Active           Patient Active Problem List   Diagnosis Date Noted  . Anticoagulant long-term use 12/20/2019  . Mixed hyperlipidemia 09/19/2019  . Prediabetes 09/19/2019  . HTN (hypertension) 04/06/2019  . Hypothyroidism 04/06/2019  . CLL (chronic lymphocytic leukemia) ([place]) 04/06/2019  . CVA (cerebral vascular accident) ([facility]) 04/06/2019  . Atrial fibrillation with RVR ([place]) 04/06/2019  . Antiphospholipid syndrome ([place]) 03/05/2017    Immunization History  Administered Date(s) Administered  . Fluad Quad(high Dose 65+) 12/26/2019  . Moderna Sars-Covid-2 Vaccination 05/20/2019, 06/17/2019, 01/23/2020  . Pneumococcal Conjugate-13 01/31/2015  . Pneumococcal Polysaccharide-23 01/06/2013    Conditions to be addressed/monitored:  Hypertension, Hyperlipidemia, Atrial Fibrillation, Hypothyroidism and Prediabetes  There are no care plans that you recently modified to display for this patient.  Medication Assistance: None required.  Patient affirms current coverage meets needs.  Patient[facility] pharmacy [facility]:  [id number] [place] [place] - [address] [place] [address] Phone: [phone] Fax: [phone]  [facility] [place], [place] - [address] AT Portal to Registered [facility] [address] [place] [address] Phone: [phone] Fax: [phone]  Uses pill box? Yes Pt endorses ***% compliance  We discussed: Current pharmacy is preferred with insurance plan and patient is satisfied with pharmacy services Patient decided to: Continue current medication management strategy  Care Plan and Follow Up Patient Decision:  Patient agrees to Care Plan and Follow-up.  Plan: Telephone follow up appointment with care management team member scheduled for:  6 months  ***    Current Barriers:  . Unable to achieve control of cholesterol evidcened by elevated LDL.    Pharmacist Clinical Goal(s):   . Over the next 90 days, patient will achieve control of choleterol as evidenced by LDL through collaboration with PharmD and provider.    Interventions: . 1:1 collaboration with Cox, Kirsten, MD regarding development and update of comprehensive plan of care as evidenced by provider attestation and co-signature . Inter-disciplinary care team collaboration (see longitudinal plan of care) . Comprehensive medication review performed; medication list updated in electronic medical record  Hypertension (BP goal <130/80) -{CHL Controlled/Uncontrolled:2109141014} -Current treatment: . Chlorthalidone 25 mg daily  . Clonidine 0.3 mg bid . Metoprolol tartrate 50 mg bid   -Medications previously tried: none reported  -Current home readings: *** -Current dietary habits: *** -Current exercise habits: *** -{ACTIONS;DENIES/REPORTS:21021675::"Denies"} hypotensive/hypertensive symptoms -Educated on {CCM BP Counseling:25124} -Counseled to monitor BP at home ***, document, and provide log at future appointments -{CCMPHARMDINTERVENTION:25122}  Hyperlipidemia: (LDL goal < ***) -{CHL Controlled/Uncontrolled:2109141014} -Current treatment: . *** -Medications previously tried: ***  -Current dietary patterns: *** -Current exercise habits: *** -Educated on {CCM HLD Counseling:25126} -{CCMPHARMDINTERVENTION:25122}  Diabetes (A1c goal {A1c goals:23924}) -{CHL Controlled/Uncontrolled:2109141014} -Current medications: . *** -Medications previously tried: ***  -Current home glucose readings . fasting glucose: *** . post prandial glucose: *** -{ACTIONS;DENIES/REPORTS:21021675::"Denies"} hypoglycemic/hyperglycemic symptoms -Current meal patterns:  . breakfast: ***  . lunch: ***  . dinner: *** . snacks: *** . drinks: *** -Current exercise: *** -Educated on{CCM DM COUNSELING:25123} -Counseled to check feet daily and get yearly eye exams -{CCMPHARMDINTERVENTION:25122}  Atrial Fibrillation (Goal:  prevent stroke and major bleeding) -{CHL Controlled/Uncontrolled:2109141014}  -CHADSVASC: *** -Current treatment: . Rate control: ***metoprolol 50 mg bid  . Anticoagulation: ***Eliquis 5 mg bid  -Medications previously tried: *** -Home BP and HR readings: ***  -Counseled on {CCMAFIBCOUNSELING:25120} -{CCMPHARMDINTERVENTION:25122}   Hypothyroidism (Goal: ***) -{CHL Controlled/Uncontrolled:2109141014} -Current treatment  . Levothyroxine 50 mcg daily  -Medications previously tried: ***  -{CCMPHARMDINTERVENTION:25122}    Osteoporosis / Osteopenia (Goal ***) -{CHL Controlled/Uncontrolled:2109141014} -Last DEXA Scan: ***   T-Score femoral neck: ***  T-Score total hip: ***  T-Score lumbar spine: ***  T-Score forearm radius: ***  10-year probability of major osteoporotic fracture: ***  10-year probability of hip fracture: *** -Patient {is;is not an osteoporosis candidate:23886} -Current treatment  . ***Vitamin D daily  -Medications previously tried: ***  -{Osteoporosis Counseling:23892} -{CCMPHARMDINTERVENTION:25122}  Health Maintenance -Vaccine gaps: ***tetanus and shingles*** -Current therapy:  . *** -Educated on {ccm supplement counseling:25128} -{CCM Patient satisfied:25129} -{CCMPHARMDINTERVENTION:25122}   Patient Goals/Self-Care Activities . Over the next *** days, patient will:  - {pharmacypatientgoals:24919}  Follow Up Plan: {CM FOLLOW UP PLAN:22241}  

## 2020-05-28 ENCOUNTER — Other Ambulatory Visit: Payer: Self-pay | Admitting: Family Medicine

## 2020-06-05 ENCOUNTER — Telehealth: Payer: Medicare HMO

## 2020-06-05 NOTE — Progress Notes (Deleted)
Chronic Care Management Pharmacy Note  06/05/2020 Name:  Cynthia Gardner MRN:  [id number] DOB:  1946-02-07  Subjective: Cynthia Gardner is an 75 y.o. year old female who is a primary patient of Cox, Kirsten, MD.  The CCM team was consulted for assistance with disease management and care coordination needs.    Engaged with patient by telephone for follow up visit in response to provider referral for pharmacy case management and/or care coordination services.   Consent to Services:  The patient was given information about Chronic Care Management services, agreed to services, and gave verbal consent prior to initiation of services.  Please see initial visit note for detailed documentation.   Patient Care Team: Rochel Brome, MD as PCP - General (Family Medicine) Burnice Logan, [facility] as Pharmacist (Pharmacist) Marcy Panning, MD as Referring Physician (Oncology)  Recent office visits: 05/01/2020 - Eliquis samples provided. Lipids not controlled. LDL 108 - work on healthier eating/exercise.  03/16/2020 - cefdinir 300 mg bid for 10 days. Continue mucinex. Chest x-ray if not improved by Monday.  03/09/2020 - sinusitis - zithromax rx ordered.  Recent consult visits: ***  Hospital visits: None in previous 6 months  Objective:  Lab Results  Component Value Date   CREATININE 1.34 (H) 05/07/2020   BUN 24 05/07/2020   GFRNONAA 39 (L) 05/07/2020   GFRAA 45 (L) 05/07/2020   NA 141 05/07/2020   K 4.7 05/07/2020   CALCIUM 9.1 05/07/2020   CO2 20 05/07/2020    Lab Results  Component Value Date/Time   HGBA1C 5.5 12/26/2019 09:21 AM   HGBA1C 6.1 (H) 04/06/2019 10:00 AM    Last diabetic Eye exam: No results found for: HMDIABEYEEXA  Last diabetic Foot exam: No results found for: HMDIABFOOTEX   Lab Results  Component Value Date   CHOL 175 05/07/2020   HDL 42 05/07/2020   LDLCALC 108 (H) 05/07/2020   TRIG 143 05/07/2020   CHOLHDL 4.2 05/07/2020    Hepatic Function Latest Ref Rng & Units  05/07/2020 12/26/2019 09/12/2019  Total Protein 6.0 - 8.5 g/dL 6.7 6.4 6.5  Albumin 3.7 - 4.7 g/dL 4.2 4.1 4.0  AST 0 - 40 IU/L $Remov'21 15 20  'YKAIUm$ ALT 0 - 32 IU/L $Remov'14 13 10  'ojfFgb$ Alk Phosphatase 44 - 121 IU/L 112 99 92  Total Bilirubin 0.0 - 1.2 mg/dL 0.8 0.6 0.9    Lab Results  Component Value Date/Time   TSH 3.260 05/07/2020 11:40 AM   TSH 3.860 09/12/2019 12:00 AM    CBC Latest Ref Rng & Units 05/07/2020 12/26/2019 09/12/2019  WBC 3.4 - 10.8 x10E3/uL 35.2(HH) 33.4(HH) 24.9(HH)  Hemoglobin 11.1 - 15.9 g/dL 12.7 12.1 12.0  Hematocrit 34.0 - 46.6 % 37.8 36.5 37.1  Platelets 150 - 450 x10E3/uL 299 266 270    Lab Results  Component Value Date/Time   VD25OH 28.0 (L) 12/26/2019 09:21 AM    Clinical ASCVD: Yes  The ASCVD Risk score Mikey Bussing DC Jr., et al., 2013) failed to calculate for the following reasons:   The patient has a prior MI or stroke diagnosis    Depression screen [facility] 2/9 09/19/2019  Decreased Interest 0  Down, Depressed, Hopeless 0  PHQ - 2 Score 0     ***Other: (CHADS2VASc if Afib, MMRC or CAT for COPD, ACT, DEXA)  Social History   Tobacco Use  Smoking Status Former Smoker  Smokeless Tobacco Never Used   BP Readings from Last 3 Encounters:  05/01/20 115/80  03/16/20 107/77  12/26/19 108/60   Pulse Readings from Last 3 Encounters:  05/01/20 70  03/16/20 67  12/26/19 (!) 56   Wt Readings from Last 3 Encounters:  05/01/20 173 lb (78.5 kg)  12/26/19 178 lb 9.6 oz (81 kg)  09/19/19 175 lb (79.4 kg)    Assessment/Interventions: Review of patient past medical history, allergies, medications, health status, including review of consultants reports, laboratory and other test data, was performed as part of comprehensive evaluation and provision of chronic care management services.   SDOH:  (Social Determinants of Health) assessments and interventions performed: Yes   CCM Care Plan  No Known Allergies  Medications Reviewed Today    Reviewed by Rochel Brome, MD (Physician) on  05/09/20 at [place] List Status: <None>  Medication Order Taking? Sig Documenting Provider Last Dose Status Informant  apixaban (ELIQUIS) 5 MG TABS tablet [id number] Yes Take 1 tablet (5 mg total) by mouth 2 (two) times daily. Cox, Kirsten, MD Taking Active   cetirizine (ZYRTEC) 10 MG tablet [id number] Yes TAKE 1 TABLET BY MOUTH EVERY DAY Cox, Kirsten, MD Taking Active   chlorthalidone (HYGROTON) 25 MG tablet [id number] Yes TAKE 1 TABLET BY MOUTH EVERY DAY Marge Duncans, PA-C Taking Active   Cholecalciferol (VITAMIN D3 PO) [id number] Yes Take 1 tablet by mouth daily. [provider] Taking Active Self  cloNIDine (CATAPRES) 0.3 MG tablet [id number] Yes TAKE 1 TABLET TWICE A DAY Cox, Kirsten, MD Taking Active   CVS CHEWABLE C WITH ROSE HIPS 500 MG CHEW [id number] Yes TAKE 1 TABLET BY MOUTH EVERY DAY Cox, Kirsten, MD Taking Active   diphenoxylate-atropine (LOMOTIL) 2.5-0.025 MG tablet [id number] Yes Take 1 tablet by mouth 4 (four) times daily as needed for diarrhea or loose stools. Cox, Kirsten, MD Taking Active   fluticasone [facility]) 50 MCG/ACT nasal spray [id number] Yes Place 2 sprays into both nostrils at bedtime.  [provider] Taking Active Self  folic acid (FOLVITE) 1 MG tablet [id number] Yes TAKE 1 TABLET BY MOUTH EVERY DAY Cox, Kirsten, MD Taking Active   levothyroxine (SYNTHROID) 50 MCG tablet [id number] Yes Take 1 tablet (50 mcg total) by mouth daily. Lillard Anes, MD Taking Active   metoprolol tartrate (LOPRESSOR) 50 MG tablet [id number] Yes Take 1 tablet (50 mg total) by mouth 2 (two) times daily. Thurnell Lose, MD Taking Active   Multiple Vitamins-Minerals (ZINC PO) [id number] Yes Take 1 tablet by mouth daily. [provider] Taking Active Self  potassium chloride (MICRO-K) 10 MEQ CR capsule [id number] Yes TAKE 4 CAPSULES BY MOUTH EVERY DAY Cox, Kirsten, MD Taking Active   Zinc Sulfate (ZINC-220 PO) [id number] Yes Take by mouth. [provider] Taking  Active           Patient Active Problem List   Diagnosis Date Noted  . Anticoagulant long-term use 12/20/2019  . Mixed hyperlipidemia 09/19/2019  . Prediabetes 09/19/2019  . HTN (hypertension) 04/06/2019  . Hypothyroidism 04/06/2019  . CLL (chronic lymphocytic leukemia) ([place]) 04/06/2019  . CVA (cerebral vascular accident) ([place]) 04/06/2019  . Atrial fibrillation with RVR ([place]) 04/06/2019  . Antiphospholipid syndrome ([place]) 03/05/2017    Immunization History  Administered Date(s) Administered  . Fluad Quad(high Dose 65+) 12/26/2019  . Moderna Sars-Covid-2 Vaccination 05/20/2019, 06/17/2019, 01/23/2020  . Pneumococcal Conjugate-13 01/31/2015  . Pneumococcal Polysaccharide-23 01/06/2013    Conditions to be addressed/monitored:  Hypertension, Hyperlipidemia, Atrial Fibrillation, Hypothyroidism and Prediabetes  There are no care plans that you recently modified to display for this patient.  Medication Assistance: None required.  Patient affirms current coverage meets needs.  [facility]  [id number] [place] [place] [address] Phone: [phone] Fax: [phone]  [facility] [place] [place] [facility] [place] [address] Phone: [phone] Fax: [phone]  Uses pill box? Yes Pt endorses ***% compliance  We discussed: Current pharmacy is preferred with insurance plan and patient is satisfied with pharmacy services Patient decided to: Continue current medication management strategy  Care Plan and Follow Up Patient Decision:  Patient agrees to Care Plan and Follow-up.  Plan: Telephone follow up appointment with care management team member scheduled for:  6 months  ***    Current Barriers:  . Unable to achieve control of cholesterol evidcened by elevated LDL.    Pharmacist Clinical Goal(s):   Marland Kitchen Over the next 90 days, patient will achieve control of choleterol as evidenced by LDL through collaboration with PharmD and provider.    Interventions: . 1:1 collaboration with Rochel Brome, MD regarding development and update of comprehensive plan of care as evidenced by provider attestation and co-signature . Inter-disciplinary care team collaboration (see longitudinal plan of care) . Comprehensive medication review performed; medication list updated in electronic medical record  Hypertension (BP goal <130/80) -{CHL Controlled/[phone]} -Current treatment: . Chlorthalidone 25 mg daily  . Clonidine 0.3 mg bid . Metoprolol tartrate 50 mg bid   -Medications previously tried: none reported  -Current home readings: *** -Current dietary habits: *** -Current exercise habits: *** -{ACTIONS;DENIES/REPORTS:21021675::"Denies"} hypotensive/hypertensive symptoms -Educated on {CCM BP Counseling:25124} -Counseled to monitor BP at home ***, document, and provide log at future appointments -{CCMPHARMDINTERVENTION:25122}  Hyperlipidemia: (LDL goal < ***) -{CHL Controlled/[phone]} -Current treatment: . *** -Medications previously tried: ***  -Current dietary patterns: *** -Current exercise habits: *** -Educated on {CCM HLD Counseling:25126} -{CCMPHARMDINTERVENTION:25122}  Diabetes (A1c goal {A1c goals:23924}) -{CHL Controlled/[phone]} -Current medications: . *** -Medications previously tried: ***  -Current home glucose readings . fasting glucose: *** . post prandial glucose: *** -{ACTIONS;DENIES/REPORTS:21021675::"Denies"} hypoglycemic/hyperglycemic symptoms -Current meal patterns:  . breakfast: ***  . lunch: ***  . dinner: *** . snacks: *** . drinks: *** -Current exercise: *** -Educated on{CCM DM COUNSELING:25123} -Counseled to check feet daily and get yearly eye exams -{CCMPHARMDINTERVENTION:25122}  Atrial Fibrillation (Goal:  prevent stroke and major bleeding) -{CHL Controlled/[phone]}  -CHADSVASC: *** -Current treatment: . Rate control: ***metoprolol 50 mg bid  . Anticoagulation: ***Eliquis 5 mg bid  -Medications previously tried: *** -Home BP and HR readings: ***  -Counseled on {CCMAFIBCOUNSELING:25120} -{CCMPHARMDINTERVENTION:25122}   Hypothyroidism (Goal: ***) -{CHL Controlled/[phone]} -Current treatment  . Levothyroxine 50 mcg daily  -Medications previously tried: ***  -{CCMPHARMDINTERVENTION:25122}    Osteoporosis / Osteopenia (Goal ***) -{CHL Controlled/[phone]} -Last DEXA Scan: ***   T-Score femoral neck: ***  T-Score total hip: ***  T-Score lumbar spine: ***  T-Score forearm radius: ***  10-year probability of major osteoporotic fracture: ***  10-year probability of hip fracture: *** -Patient {is;is not an osteoporosis candidate:23886} -Current treatment  . ***Vitamin D daily  -Medications previously tried: ***  -{Osteoporosis Counseling:23892} -{CCMPHARMDINTERVENTION:25122}  Health Maintenance -Vaccine gaps: ***tetanus and shingles*** -Current therapy:  . *** -Educated on {ccm supplement counseling:25128} -{CCM Patient satisfied:25129} -{CCMPHARMDINTERVENTION:25122}   Patient Goals/Self-Care Activities . Over the next *** days, patient will:  - {pharmacypatientgoals:24919}  Follow Up Plan: {CM FOLLOW UP KDXI:33825}

## 2020-06-08 ENCOUNTER — Other Ambulatory Visit: Payer: Self-pay | Admitting: Family Medicine

## 2020-06-18 ENCOUNTER — Other Ambulatory Visit: Payer: Self-pay

## 2020-06-18 ENCOUNTER — Ambulatory Visit (INDEPENDENT_AMBULATORY_CARE_PROVIDER_SITE_OTHER): Payer: Medicare HMO

## 2020-06-18 DIAGNOSIS — E782 Mixed hyperlipidemia: Secondary | ICD-10-CM

## 2020-06-18 DIAGNOSIS — I10 Essential (primary) hypertension: Secondary | ICD-10-CM

## 2020-06-18 NOTE — Progress Notes (Signed)
Chronic Care Management Pharmacy Note  06/18/2020 Name:  Cynthia Gardner MRN:  [id number] DOB:  [birth year]   Plan Updates:    Reviewed patient home blood pressure readings and recent lab results. Encouraged patient to eat a diet rich in vegetables, fruit and lean meat. Discussed benefits of exercise. Patient acknowledges understanding.   Subjective: Cynthia Gardner is an 75 y.o. year old female who is a primary patient of Cox, Kirsten, MD.  The CCM team was consulted for assistance with disease management and care coordination needs.    Engaged with patient by telephone for follow up visit in response to provider referral for pharmacy case management and/or care coordination services.   Consent to Services:  The patient was given information about Chronic Care Management services, agreed to services, and gave verbal consent prior to initiation of services.  Please see initial visit note for detailed documentation.   Patient Care Team: Rochel Brome, MD as PCP - General (Family Medicine) Burnice Logan, [facility] as Pharmacist (Pharmacist) Marcy Panning, MD as Referring Physician (Oncology)  Recent office visits: 05/01/2020 - Eliquis samples provided. Lipids not controlled. LDL 108 - work on healthier eating/exercise.  03/16/2020 - cefdinir 300 mg bid for 10 days. Continue mucinex. Chest x-ray if not improved by Monday.  03/09/2020 - sinusitis - zithromax rx ordered.  Recent consult visits: None reported   Hospital visits: None in previous 6 months  Objective:  Lab Results  Component Value Date   CREATININE 1.34 (H) 05/07/2020   BUN 24 05/07/2020   GFRNONAA 39 (L) 05/07/2020   GFRAA 45 (L) 05/07/2020   NA 141 05/07/2020   K 4.7 05/07/2020   CALCIUM 9.1 05/07/2020   CO2 20 05/07/2020    Lab Results  Component Value Date/Time   HGBA1C 5.5 12/26/2019 09:21 AM   HGBA1C 6.1 (H) 04/06/2019 10:00 AM    Last diabetic Eye exam: No results found for: HMDIABEYEEXA  Last diabetic Foot  exam: No results found for: HMDIABFOOTEX   Lab Results  Component Value Date   CHOL 175 05/07/2020   HDL 42 05/07/2020   LDLCALC 108 (H) 05/07/2020   TRIG 143 05/07/2020   CHOLHDL 4.2 05/07/2020    Hepatic Function Latest Ref Rng & Units 05/07/2020 12/26/2019 09/12/2019  Total Protein 6.0 - 8.5 g/dL 6.7 6.4 6.5  Albumin 3.7 - 4.7 g/dL 4.2 4.1 4.0  AST 0 - 40 IU/L $Remov'21 15 20  'ylgZYx$ ALT 0 - 32 IU/L $Remov'14 13 10  'yEdRHH$ Alk Phosphatase 44 - 121 IU/L 112 99 92  Total Bilirubin 0.0 - 1.2 mg/dL 0.8 0.6 0.9    Lab Results  Component Value Date/Time   TSH 3.260 05/07/2020 11:40 AM   TSH 3.860 09/12/2019 12:00 AM    CBC Latest Ref Rng & Units 05/07/2020 12/26/2019 09/12/2019  WBC 3.4 - 10.8 x10E3/uL 35.2(HH) 33.4(HH) 24.9(HH)  Hemoglobin 11.1 - 15.9 g/dL 12.7 12.1 12.0  Hematocrit 34.0 - 46.6 % 37.8 36.5 37.1  Platelets 150 - 450 x10E3/uL 299 266 270    Lab Results  Component Value Date/Time   VD25OH 28.0 (L) 12/26/2019 09:21 AM    Clinical ASCVD: Yes  The ASCVD Risk score Mikey Bussing DC Jr., et al., 2013) failed to calculate for the following reasons:   The patient has a prior MI or stroke diagnosis    Depression screen [facility] 2/9 09/19/2019  Decreased Interest 0  Down, Depressed, Hopeless 0  PHQ - 2 Score 0   Social History   Tobacco  Use  Smoking Status Former Smoker  Smokeless Tobacco Never Used   BP Readings from Last 3 Encounters:  05/01/20 115/80  03/16/20 107/77  12/26/19 108/60   Pulse Readings from Last 3 Encounters:  05/01/20 70  03/16/20 67  12/26/19 (!) 56   Wt Readings from Last 3 Encounters:  05/01/20 173 lb (78.5 kg)  12/26/19 178 lb 9.6 oz (81 kg)  09/19/19 175 lb (79.4 kg)    Assessment/Interventions: Review of patient past medical history, allergies, medications, health status, including review of consultants reports, laboratory and other test data, was performed as part of comprehensive evaluation and provision of chronic care management services.   SDOH:  (Social  Determinants of Health) assessments and interventions performed: Yes   CCM Care Plan  No Known Allergies  Medications Reviewed Today    Reviewed by Burnice Logan, [facility] (Pharmacist) on 06/18/20 at 1532  Med List Status: <None>  Medication Order Taking? Sig Documenting Provider Last Dose Status Informant  apixaban (ELIQUIS) 5 MG TABS tablet [id number] Yes Take 1 tablet (5 mg total) by mouth 2 (two) times daily. Cox, Kirsten, MD Taking Active   cetirizine (ZYRTEC) 10 MG tablet [id number] Yes TAKE 1 TABLET BY MOUTH EVERY DAY Cox, Kirsten, MD Taking Active   chlorthalidone (HYGROTON) 25 MG tablet [id number] Yes TAKE 1 TABLET BY MOUTH EVERY DAY Marge Duncans, PA-C Taking Active   Cholecalciferol (VITAMIN D3 PO) [id number] Yes Take 1 tablet by mouth daily. [provider] Taking Active Self  cloNIDine (CATAPRES) 0.3 MG tablet [id number] Yes TAKE 1 TABLET TWICE A DAY  Patient taking differently: 1/2 tablet am and 1 tablet pm   Cox, Kirsten, MD Taking Active   [facility] CHEWABLE C WITH ROSE HIPS 500 MG CHEW [id number] Yes TAKE 1 TABLET BY MOUTH EVERY DAY Cox, Kirsten, MD Taking Active   diphenoxylate-atropine (LOMOTIL) 2.5-0.025 MG tablet [id number] Yes Take 1 tablet by mouth 4 (four) times daily as needed for diarrhea or loose stools. Cox, Kirsten, MD Taking Active   fluticasone Asencion Islam) 50 MCG/ACT nasal spray [id number] Yes USE 2 SPRAYS IN EACH NOSTRIL DAILY Cox, Kirsten, MD Taking Active   folic acid (FOLVITE) 1 MG tablet [id number] Yes TAKE 1 TABLET BY MOUTH EVERY DAY Cox, Kirsten, MD Taking Active   levothyroxine (SYNTHROID) 50 MCG tablet [id number] Yes Take 1 tablet (50 mcg total) by mouth daily. Lillard Anes, MD Taking Active   metoprolol tartrate (LOPRESSOR) 50 MG tablet [id number] Yes Take 1 tablet (50 mg total) by mouth 2 (two) times daily. Thurnell Lose, MD Taking Active   Multiple Vitamins-Minerals (ZINC PO) [id number] Yes Take 1 tablet by mouth daily. [provider] Taking  Active Self  potassium chloride (MICRO-K) 10 MEQ CR capsule [id number] Yes TAKE 4 CAPSULES BY MOUTH EVERY DAY Cox, Kirsten, MD Taking Active   Zinc Sulfate (ZINC-220 PO) [id number] Yes Take by mouth. [provider] Taking Active           Patient Active Problem List   Diagnosis Date Noted  . Anticoagulant long-term use 12/20/2019  . Mixed hyperlipidemia 09/19/2019  . Prediabetes 09/19/2019  . HTN (hypertension) 04/06/2019  . Hypothyroidism 04/06/2019  . CLL (chronic lymphocytic leukemia) ([place]) 04/06/2019  . CVA (cerebral vascular accident) ([place]) 04/06/2019  . Atrial fibrillation with RVR ([place]) 04/06/2019  . Antiphospholipid syndrome ([place]) 03/05/2017    Immunization History  Administered Date(s) Administered  . Fluad Quad(high Dose 65+) 12/26/2019  . Moderna Sars-Covid-2 Vaccination 05/20/2019, 06/17/2019, 01/23/2020  . Pneumococcal Conjugate-13  01/31/2015  . Pneumococcal Polysaccharide-23 01/06/2013    Conditions to be addressed/monitored:  Hypertension, Hyperlipidemia, Atrial Fibrillation, Hypothyroidism and Prediabetes  Care Plan : [place]  Updates made by Burnice Logan, RPH since 06/18/2020 12:00 AM    Problem: htn, hld   Priority: High  Onset Date: 06/18/2020    Long-Range Goal: Disease Management   Start Date: 06/18/2020  Expected End Date: 06/18/2021  This Visit's Progress: On track  Priority: High  Note:     Current Barriers:  . Unable to achieve control of cholesterol evidcened by elevated LDL.    Pharmacist Clinical Goal(s):  Marland Kitchen Over the next 90 days, patient will achieve control of choleterol as evidenced by LDL through collaboration with PharmD and provider.    Interventions: . 1:1 collaboration with Rochel Brome, MD regarding development and update of comprehensive plan of care as evidenced by provider attestation and co-signature . Inter-disciplinary care team collaboration (see longitudinal plan of care) . Comprehensive  medication review performed; medication list updated in electronic medical record  Hypertension (BP goal <130/80) -controlled -Current treatment: . Chlorthalidone 25 mg daily  . Clonidine 0.3 mg take 1/2 tablet in the morning and 1 tablet in the evening  . Metoprolol tartrate 50 mg bid   -Medications previously tried: none reported  -Current home readings: home readings are good/well below goal since COVID -Current dietary habits:  plans to cut back on potassium.  -Current exercise habits:  denies regular exercise.  -Denies hypotensive/hypertensive symptoms -Educated on BP goals and benefits of medications for prevention of heart attack, stroke and kidney damage; Daily salt intake goal < 2300 mg; Exercise goal of 150 minutes per week; Importance of home blood pressure monitoring; -Counseled to monitor BP at home weekly, document, and provide log at future appointments -Counseled on diet and exercise extensively Recommended to continue current medication Counseled on healthy diet and exercise. Monitoring blood pressure.   Hyperlipidemia: (LDL goal < 70) -not controlled  -Current treatment: . Diet and lifestyle  -Medications previously tried: none reported -Current dietary patterns: Patient plans to back off of bacon and other fatty meats. Discussed including more fruits, vegetables and lean protein.  -Current exercise habits: none currently -Educated on Cholesterol goals;  Benefits of statin for ASCVD risk reduction; Importance of limiting foods high in cholesterol; Exercise goal of 150 minutes per week; -Counseled on diet and exercise extensively Recommended patient begin riding stationary bike or walking for exercise.   Patient Goals/Self-Care Activities . Over the next 90 days, patient will:  - take medications as prescribed focus on medication adherence by using pill box target a minimum of 150 minutes of moderate intensity exercise weekly engage in dietary modifications  by eating plenty of fruits and vegetables. Working to focus on lean meats for protein.   Follow Up Plan: Telephone follow up appointment with care management team member scheduled for: 10/15/2020      Medication Assistance: None required.  Patient affirms current coverage meets needs.  Patient's [facility] pharmacy [facility]:  [id number] [place], [place] [place] [address] Phone: [phone] Fax: [phone]  [facility] [place] [place] [facility] [address] [place] [address] Phone: [phone] Fax: [phone]  Uses pill box? Yes Pt endorses good compliance  We discussed: Current pharmacy is preferred with insurance plan and patient is satisfied with pharmacy services Patient decided to: Continue current  medication management strategy  Care Plan and Follow Up Patient Decision:  Patient agrees to Care Plan and Follow-up.  Plan: Telephone follow up appointment with care management team member scheduled for:  10/15/2020

## 2020-06-18 NOTE — Patient Instructions (Signed)
Visit Information  Goals Addressed            This Visit's Progress   . Learn More About My Health       Timeframe:  Long-Range Goal Priority:  High Start Date:       06/18/2020                    Expected End Date:    06/18/2021               Follow Up Date 10/15/2020    - ask questions - repeat what I heard to make sure I understand - bring a list of my medicines to the visit - speak up when I don't understand    Why is this important?    The best way to learn about your health and care is by talking to the doctor and nurse.   They will answer your questions and give you information in the way that you like best.    Notes:     . Lifestyle Change-Hypertension       Timeframe:  Long-Range Goal Priority:  High Start Date:        06/18/2020                     Expected End Date:   06/18/2021                    Follow Up Date 10/15/2020    - ask questions to understand    Why is this important?    The changes that you are asked to make may be hard to do.   This is especially true when the changes are life-long.   Knowing why it is important to you is the first step.   Working on the change with your family or support person helps you not feel alone.   Reward yourself and family or support person when goals are met. This can be an activity you choose like bowling, hiking, biking, swimming or shooting hoops.     Notes:     Marland Kitchen Manage My Medicine       Timeframe:  Long-Range Goal Priority:  High Start Date:    06/18/2020                         Expected End Date:       06/18/2021                Follow Up Date 10/15/2020    - call for medicine refill 2 or 3 days before it runs out - keep a list of all the medicines I take; vitamins and herbals too - use a pillbox to sort medicine    Why is this important?   . These steps will help you keep on track with your medicines.   Notes:       Patient Care Plan: CCM Pharmacy Care Plan    Problem Identified:  htn, hld   Priority: High  Onset Date: 06/18/2020    Long-Range Goal: Disease Management   Start Date: 06/18/2020  Expected End Date: 06/18/2021  This Visit's Progress: On track  Priority: High  Note:     Current Barriers:  . Unable to achieve control of cholesterol evidcened by elevated LDL.    Pharmacist Clinical Goal(s):  Marland Kitchen Over the next 90 days, patient will achieve control of choleterol as evidenced by LDL through  collaboration with PharmD and provider.    Interventions: . 1:1 collaboration with Rochel Brome, MD regarding development and update of comprehensive plan of care as evidenced by provider attestation and co-signature . Inter-disciplinary care team collaboration (see longitudinal plan of care) . Comprehensive medication review performed; medication list updated in electronic medical record  Hypertension (BP goal <130/80) -controlled -Current treatment: . Chlorthalidone 25 mg daily  . Clonidine 0.3 mg take 1/2 tablet in the morning and 1 tablet in the evening  . Metoprolol tartrate 50 mg bid   -Medications previously tried: none reported  -Current home readings: home readings are good/well below goal since COVID -Current dietary habits:  plans to cut back on potassium.  -Current exercise habits:  denies regular exercise.  -Denies hypotensive/hypertensive symptoms -Educated on BP goals and benefits of medications for prevention of heart attack, stroke and kidney damage; Daily salt intake goal < 2300 mg; Exercise goal of 150 minutes per week; Importance of home blood pressure monitoring; -Counseled to monitor BP at home weekly, document, and provide log at future appointments -Counseled on diet and exercise extensively Recommended to continue current medication Counseled on healthy diet and exercise. Monitoring blood pressure.   Hyperlipidemia: (LDL goal < 70) -not controlled  -Current treatment: . Diet and lifestyle  -Medications previously tried: none  reported -Current dietary patterns: Patient plans to back off of bacon and other fatty meats. Discussed including more fruits, vegetables and lean protein.  -Current exercise habits: none currently -Educated on Cholesterol goals;  Benefits of statin for ASCVD risk reduction; Importance of limiting foods high in cholesterol; Exercise goal of 150 minutes per week; -Counseled on diet and exercise extensively Recommended patient begin riding stationary bike or walking for exercise.   Patient Goals/Self-Care Activities . Over the next 90 days, patient will:  - take medications as prescribed focus on medication adherence by using pill box target a minimum of 150 minutes of moderate intensity exercise weekly engage in dietary modifications by eating plenty of fruits and vegetables. Working to focus on lean meats for protein.   Follow Up Plan: Telephone follow up appointment with care management team member scheduled for: 10/15/2020      The patient verbalized understanding of instructions, educational materials, and care plan provided today and declined offer to receive copy of patient instructions, educational materials, and care plan.  Telephone follow up appointment with pharmacy team member scheduled for: 10/15/2020  Burnice Logan, [facility]  Preventing High Cholesterol Cholesterol is a white, waxy substance similar to fat that the human body needs to help build cells. The liver makes all the cholesterol that a person's body needs. Having high cholesterol (hypercholesterolemia) increases your risk for heart disease and stroke. Extra or excess cholesterol comes from the food that you eat. High cholesterol can often be prevented with diet and lifestyle changes. If you already have high cholesterol, you can control it with diet, lifestyle changes, and medicines. How can high cholesterol affect me? If you have high cholesterol, fatty deposits (plaques) may build up on the walls of your blood  vessels. The blood vessels that carry blood away from your heart are called arteries. Plaques make the arteries narrower and stiffer. This in turn can:  Restrict or block blood flow and cause blood clots to form.  Increase your risk for heart attack and stroke. What can increase my risk for high cholesterol? This condition is more likely to develop in people who:  Eat foods that are high in saturated fat or  cholesterol. Saturated fat is mostly found in foods that come from animal sources.  Are overweight.  Are not getting enough exercise.  Have a family history of high cholesterol (familial hypercholesterolemia). What actions can I take to prevent this? Nutrition  Eat less saturated fat.  Avoid trans fats (partially hydrogenated oils). These are often found in margarine and in some baked goods, fried foods, and snacks bought in packages.  Avoid precooked or cured meat, such as bacon, sausages, or meat loaves.  Avoid foods and drinks that have added sugars.  Eat more fruits, vegetables, and whole grains.  Choose healthy sources of protein, such as fish, poultry, lean cuts of red meat, beans, peas, lentils, and nuts.  Choose healthy sources of fat, such as: ? Nuts. ? Vegetable oils, especially olive oil. ? Fish that have healthy fats, such as omega-3 fatty acids. These fish include mackerel or salmon.   Lifestyle  Lose weight if you are overweight. Maintaining a healthy body mass index (BMI) can help prevent or control high cholesterol. It can also lower your risk for diabetes and high blood pressure. Ask your health care provider to help you with a diet and exercise plan to lose weight safely.  Do not use any products that contain nicotine or tobacco, such as cigarettes, e-cigarettes, and chewing tobacco. If you need help quitting, ask your health care provider. Alcohol use  Do not drink alcohol if: ? Your health care provider tells you not to drink. ? You are pregnant, may  be pregnant, or are planning to become pregnant.  If you drink alcohol: ? Limit how much you use to:  0-1 drink a day for women.  0-2 drinks a day for men. ? Be aware of how much alcohol is in your drink. In the [place] one drink equals one 12 oz bottle of beer (355 mL), one 5 oz glass of wine (148 mL), or one 1 oz glass of hard liquor (44 mL). Activity  Get enough exercise. Do exercises as told by your health care provider.  Each week, do at least 150 minutes of exercise that takes a medium level of effort (moderate-intensity exercise). This kind of exercise: ? Makes your heart beat faster while allowing you to still be able to talk. ? Can be done in short sessions several times a day or longer sessions a few times a week. For example, on 5 days each week, you could walk fast or ride your bike 3 times a day for 10 minutes each time.   Medicines  Your health care provider may recommend medicines to help lower cholesterol. This may be a medicine to lower the amount of cholesterol that your liver makes. You may need medicine if: ? Diet and lifestyle changes have not lowered your cholesterol enough. ? You have high cholesterol and other risk factors for heart disease or stroke.  Take over-the-counter and prescription medicines only as told by your health care provider. General information  Manage your risk factors for high cholesterol. Talk with your health care provider about all your risk factors and how to lower your risk.  Manage other conditions that you have, such as diabetes or high blood pressure (hypertension).  Have blood tests to check your cholesterol levels at regular points in time as told by your health care provider.  Keep all follow-up visits as told by your health care provider. This is important. Where to find more information  American Heart Association: www.heart.org  National Heart, Lung, and Blood  Institute: https://wilson-eaton.com/ Summary  High cholesterol  increases your risk for heart disease and stroke. By keeping your cholesterol level low, you can reduce your risk for these conditions.  High cholesterol can often be prevented with diet and lifestyle changes.  Work with your health care provider to manage your risk factors, and have your blood tested regularly. This information is not intended to replace advice given to you by your health care provider. Make sure you discuss any questions you have with your health care provider. Document Revised: 12/28/2018 Document Reviewed: 12/28/2018 Elsevier Patient Education  [place].

## 2020-06-27 ENCOUNTER — Encounter: Payer: Self-pay | Admitting: Family Medicine

## 2020-06-27 ENCOUNTER — Telehealth (INDEPENDENT_AMBULATORY_CARE_PROVIDER_SITE_OTHER): Payer: Medicare HMO | Admitting: Family Medicine

## 2020-06-27 DIAGNOSIS — J018 Other acute sinusitis: Secondary | ICD-10-CM | POA: Diagnosis not present

## 2020-06-27 MED ORDER — AMOXICILLIN 875 MG PO TABS: 875.0000 mg | ORAL_TABLET | Freq: Two times a day (BID) | ORAL | 0 refills | Status: DC

## 2020-06-27 NOTE — Progress Notes (Addendum)
Virtual Visit via Telephone Note   This visit type was conducted due to national recommendations for restrictions regarding the COVID-19 Pandemic (e.g. social distancing) in an effort to limit this patient's exposure and mitigate transmission in our community.  Due to her co-morbid illnesses, this patient is at least at moderate risk for complications without adequate follow up.  This format is felt to be most appropriate for this patient at this time.  The patient did not have access to video technology/had technical difficulties with video requiring transitioning to audio format only (telephone).  All issues noted in this document were discussed and addressed.  No physical exam could be performed with this format.  Patient verbally consented to a telehealth visit.   Date:  07/12/2020   ID:  Cynthia Gardner, DOB 05-03-1945, MRN [id number]  Patient Location: Home Provider Location: Office/Clinic  PCP:  Rochel Brome, MD   Evaluation Performed: acute visit Chief Complaint:  sinusitis  History of Present Illness:    Cynthia Gardner is a 75 y.o. female with sinusitis x 4 days, has been taking tylenol and coracidine which has helped some. Denies any fever/chills/SOB. Sinus pain over nose and nasal congestion/coughing  The patient does not have symptoms concerning for COVID-19 infection (fever, chills, cough, or new shortness of breath).    Past Medical History:  Diagnosis Date  . Antiphospholipid antibody syndrome ([place])   . CVA (cerebral vascular accident) ([place]) 04/06/2019  . History of COVID-19   . HTN (hypertension) 04/06/2019  . Hypothyroidism 04/06/2019  . Leukemia [facility])     Past Surgical History:  Procedure Laterality Date  . CATARACT EXTRACTION Right   . COLON RESECTION  2008   ischemic bowel    Family History  Problem Relation Age of Onset  . Parkinson's disease Father   . Cirrhosis Brother        from agent orange  . Hypertension Other   . Diabetes Other     Social History    Socioeconomic History  . Marital status: Married    Spouse name: Not on file  . Number of children: 2  . Years of education: Not on file  . Highest education level: Not on file  Occupational History  . Not on file  Tobacco Use  . Smoking status: Former Research scientist (life sciences)  . Smokeless tobacco: Never Used  Vaping Use  . Vaping Use: Never used  Substance and Sexual Activity  . Alcohol use: Not Currently  . Drug use: Never  . Sexual activity: Not Currently    Birth control/protection: None  Other Topics Concern  . Not on file  Social History Narrative  . Not on file   Social Determinants of Health   Financial Resource Strain: Low Risk   . Difficulty of Paying Living Expenses: Not hard at all  Food Insecurity: No Food Insecurity  . Worried About Charity fundraiser in the Last Year: Never true  . Ran Out of Food in the Last Year: Never true  Transportation Needs: No Transportation Needs  . Lack of Transportation (Medical): No  . Lack of Transportation (Non-Medical): No  Physical Activity: Not on file  Stress: Not on file  Social Connections: Not on file  Intimate Partner Violence: Not on file    Outpatient Medications Prior to Visit  Medication Sig Dispense Refill  . apixaban (ELIQUIS) 5 MG TABS tablet Take 1 tablet (5 mg total) by mouth 2 (two) times daily. 60 tablet 0  . cetirizine (ZYRTEC) 10  MG tablet TAKE 1 TABLET BY MOUTH EVERY DAY 90 tablet 3  . chlorthalidone (HYGROTON) 25 MG tablet TAKE 1 TABLET BY MOUTH EVERY DAY 90 tablet 1  . Cholecalciferol (VITAMIN D3 PO) Take 1 tablet by mouth daily.    . cloNIDine (CATAPRES) 0.3 MG tablet TAKE 1 TABLET TWICE A DAY (Patient taking differently: 1/2 tablet am and 1 tablet pm) 180 tablet 1  . [facility] CHEWABLE C WITH ROSE HIPS 500 MG CHEW TAKE 1 TABLET BY MOUTH EVERY DAY 100 tablet 1  . diphenoxylate-atropine (LOMOTIL) 2.5-0.025 MG tablet Take 1 tablet by mouth 4 (four) times daily as needed for diarrhea or loose stools. 60 tablet 2  .  fluticasone (FLONASE) 50 MCG/ACT nasal spray USE 2 SPRAYS IN EACH NOSTRIL DAILY 48 mL 5  . folic acid (FOLVITE) 1 MG tablet TAKE 1 TABLET BY MOUTH EVERY DAY 90 tablet 1  . levothyroxine (SYNTHROID) 50 MCG tablet Take 1 tablet (50 mcg total) by mouth daily. 30 tablet 6  . metoprolol tartrate (LOPRESSOR) 50 MG tablet Take 1 tablet (50 mg total) by mouth 2 (two) times daily. 60 tablet 0  . Multiple Vitamins-Minerals (ZINC PO) Take 1 tablet by mouth daily.    . potassium chloride (MICRO-K) 10 MEQ CR capsule TAKE 4 CAPSULES BY MOUTH EVERY DAY 360 capsule 1  . Zinc Sulfate (ZINC-220 PO) Take by mouth.     No facility-administered medications prior to visit.    Allergies:   Patient has no known allergies.   Social History   Tobacco Use  . Smoking status: Former Research scientist (life sciences)  . Smokeless tobacco: Never Used  Vaping Use  . Vaping Use: Never used  Substance Use Topics  . Alcohol use: Not Currently  . Drug use: Never     Review of Systems  Constitutional: Negative for chills and fever.  HENT: Positive for sinus pain (under her eyes). Negative for congestion and sore throat.   Respiratory: Positive for cough (Dry and Productive) and sputum production (Greenish/Yellow). Negative for shortness of breath.   Gastrointestinal: Negative for abdominal pain, nausea and vomiting.  Musculoskeletal: Negative for joint pain and myalgias.  Neurological: Positive for headaches. Negative for dizziness.     Labs/Other Tests and Data Reviewed:    Recent Labs: 05/07/2020: ALT 14; BUN 24; Creatinine, Ser 1.34; Hemoglobin 12.7; Platelets 299; Potassium 4.7; Sodium 141; TSH 3.260   Recent Lipid Panel Lab Results  Component Value Date/Time   CHOL 175 05/07/2020 11:40 AM   TRIG 143 05/07/2020 11:40 AM   HDL 42 05/07/2020 11:40 AM   CHOLHDL 4.2 05/07/2020 11:40 AM   LDLCALC 108 (H) 05/07/2020 11:40 AM    Wt Readings from Last 3 Encounters:  05/01/20 173 lb (78.5 kg)  12/26/19 178 lb 9.6 oz (81 kg)  09/19/19  175 lb (79.4 kg)     Objective:    Vital Signs:  There were no vitals taken for this visit.   Physical Exam  Sounds congested.  ASSESSMENT & PLAN:   1. Acute non-recurrent sinusitis of other sinus  rx: amoxicillin  continue corecedin Rest and fluids  Meds ordered this encounter  Medications  . amoxicillin (AMOXIL) 875 MG tablet    Sig: Take 1 tablet (875 mg total) by mouth 2 (two) times daily.    Dispense:  20 tablet    Refill:  0    COVID-19 Education: The signs and symptoms of COVID-19 were discussed with the patient and how to seek care for testing (follow up  with PCP or arrange E-visit). The importance of social distancing was discussed today.  I spent 10 minutes on phone with patient.   Follow Up:  In Person prn  Signed,  Rochel Brome, MD  07/12/2020 1:07 PM    [place]

## 2020-07-30 ENCOUNTER — Other Ambulatory Visit: Payer: Self-pay | Admitting: Family Medicine

## 2020-08-05 ENCOUNTER — Other Ambulatory Visit: Payer: Self-pay | Admitting: Family Medicine

## 2020-08-06 ENCOUNTER — Telehealth: Payer: Self-pay

## 2020-08-06 NOTE — Progress Notes (Signed)
    Chronic Care Management Pharmacy Assistant   Name: Cynthia Gardner  MRN: [id number] DOB: Feb 05, 1946   Reason for Encounter: Medication Review for possible Eliquis PAP  06/27/20-Dr Cox, PCP, sinusitis, start Amoxicillin  06/18/20-Cynthia Gardner, CPP  05/07/20-Labs, Abnormal wbc. Known leukemia. Stable. Liver function normal. Kidney function abnormal, but stable. Thyroid stimulating hormone normal . Lipid panel abnormal. LDL increaed ot 108. Work on eating healthier and exercise.     Medications: Outpatient Encounter Medications as of 08/06/2020  Medication Sig  . amoxicillin (AMOXIL) 875 MG tablet Take 1 tablet (875 mg total) by mouth 2 (two) times daily.  Marland Kitchen apixaban (ELIQUIS) 5 MG TABS tablet Take 1 tablet (5 mg total) by mouth 2 (two) times daily.  . cetirizine (ZYRTEC) 10 MG tablet TAKE 1 TABLET BY MOUTH EVERY DAY  . chlorthalidone (HYGROTON) 25 MG tablet TAKE 1 TABLET BY MOUTH EVERY DAY  . Cholecalciferol (VITAMIN D3 PO) Take 1 tablet by mouth daily.  . cloNIDine (CATAPRES) 0.3 MG tablet TAKE 1 TABLET TWICE A DAY (Patient taking differently: 1/2 tablet am and 1 tablet pm)  . CVS CHEWABLE C WITH ROSE HIPS 500 MG CHEW TAKE 1 TABLET BY MOUTH EVERY DAY  . diphenoxylate-atropine (LOMOTIL) 2.5-0.025 MG tablet Take 1 tablet by mouth 4 (four) times daily as needed for diarrhea or loose stools.  . fluticasone (FLONASE) 50 MCG/ACT nasal spray USE 2 SPRAYS IN EACH NOSTRIL DAILY  . folic acid (FOLVITE) 1 MG tablet TAKE 1 TABLET BY MOUTH EVERY DAY  . levothyroxine (SYNTHROID) 50 MCG tablet Take 1 tablet (50 mcg total) by mouth daily.  . metoprolol tartrate (LOPRESSOR) 50 MG tablet Take 1 tablet (50 mg total) by mouth 2 (two) times daily.  . Multiple Vitamins-Minerals (ZINC PO) Take 1 tablet by mouth daily.  . potassium chloride (MICRO-K) 10 MEQ CR capsule TAKE 4 CAPSULES BY MOUTH EVERY DAY  . Zinc Sulfate (ZINC-220 PO) Take by mouth.   No facility-administered encounter medications on file as of  08/06/2020.   Cynthia Gardner, CPP, received a message from the patient that she was about out of Eliquis and would be in the donut hole soon.  Cynthia Gardner, CPP stated the patient has expressed before she was not eligible for patient assistance.  I notified Cynthia Gardner that Cynthia Gardner stated she can afford her Eliquis, and doesnt want PAP, she is not sure if she would qualify, she stated whoever she talked to at the office made it sound like she could get samples to carry her through, she will be out of meds as of next Monday she takes 5mg  twice daily.  Cynthia Gardner, CPP, was going to check on samples.   Patient said she is watching her diet more closely and has been walking more.    She does not have an appointment scheduled for labs, I advised her to call the office and schedule this, she is going to call to schedule for labs and Covid booster.  I confirmed her next appointment with Cynthia Gardner, [place], [place] Pharmacist Assistant [phone]

## 2020-08-09 ENCOUNTER — Other Ambulatory Visit: Payer: Self-pay | Admitting: Family Medicine

## 2020-08-09 MED ORDER — DICLOFENAC SODIUM 1 % EX GEL: CUTANEOUS | 1 refills | Status: AC

## 2020-08-10 ENCOUNTER — Other Ambulatory Visit: Payer: Self-pay | Admitting: Physician Assistant

## 2020-08-10 ENCOUNTER — Other Ambulatory Visit: Payer: Self-pay | Admitting: Legal Medicine

## 2020-08-10 DIAGNOSIS — E038 Other specified hypothyroidism: Secondary | ICD-10-CM

## 2020-08-13 ENCOUNTER — Other Ambulatory Visit: Payer: Self-pay

## 2020-08-13 ENCOUNTER — Ambulatory Visit (INDEPENDENT_AMBULATORY_CARE_PROVIDER_SITE_OTHER): Payer: Medicare HMO

## 2020-08-13 DIAGNOSIS — Z23 Encounter for immunization: Secondary | ICD-10-CM | POA: Diagnosis not present

## 2020-08-13 NOTE — Progress Notes (Signed)
   Covid-19 Vaccination Clinic  Name:  MATISON NUCCIO    MRN: [id number] DOB: Oct 12, 1945  08/13/2020  Ms. Segall was observed post Covid-19 immunization for 15 minutes without incident. She was provided with Vaccine Information Sheet and instruction to access the V-Safe system.   Ms. Salberg was instructed to call 911 with any severe reactions post vaccine: Marland Kitchen Difficulty breathing  . Swelling of face and throat  . A fast heartbeat  . A bad rash all over body  . Dizziness and weakness   Immunizations Administered    Name Date Dose VIS Date Route   Moderna Covid-19 Booster Vaccine 08/13/2020  1:38 PM 0.25 mL 01/18/2020 Intramuscular   Manufacturer: Moderna   Lot: [id number]   [place]: 14782-956-21

## 2020-08-14 ENCOUNTER — Telehealth: Payer: Self-pay

## 2020-08-14 NOTE — Telephone Encounter (Signed)
Patient PA for Diclofenac Sodium Gel was approved from 03/31/2020-04/30/2021.

## 2020-08-20 ENCOUNTER — Ambulatory Visit (INDEPENDENT_AMBULATORY_CARE_PROVIDER_SITE_OTHER): Payer: Medicare HMO

## 2020-08-20 DIAGNOSIS — H6121 Impacted cerumen, right ear: Secondary | ICD-10-CM | POA: Diagnosis not present

## 2020-08-20 NOTE — Progress Notes (Signed)
Pt came in office for ear irrigation due to impaction. Unilateral cerumen impaction, right side. Irrigation unsuccessful. Pt advised to use debrox. Pt VU and will try again at later visit.   Royce Macadamia, [place] 08/20/20 11:12 AM

## 2020-10-10 ENCOUNTER — Telehealth: Payer: Self-pay

## 2020-10-10 NOTE — Progress Notes (Signed)
    Chronic Care Management Pharmacy Assistant   Name: SHEMAIAH ROUND  MRN: [id number] DOB: 01/08/1946   Reason for Encounter: Disease State for general adherence    Recent office visits:  08/20/20-[facility] LPN, impacted cerumen of right ear, Irrigation unsuccessful. Pt advised to use debrox.  08/13/20-Covid Booster  Recent consult visits:  None   Hospital visits:  None in previous 6 months  Medications: Outpatient Encounter Medications as of 10/10/2020  Medication Sig   amoxicillin (AMOXIL) 875 MG tablet Take 1 tablet (875 mg total) by mouth 2 (two) times daily.   apixaban (ELIQUIS) 5 MG TABS tablet Take 1 tablet (5 mg total) by mouth 2 (two) times daily.   cetirizine (ZYRTEC) 10 MG tablet TAKE 1 TABLET BY MOUTH EVERY DAY   chlorthalidone (HYGROTON) 25 MG tablet TAKE 1 TABLET BY MOUTH EVERY DAY   Cholecalciferol (VITAMIN D3 PO) Take 1 tablet by mouth daily.   cloNIDine (CATAPRES) 0.3 MG tablet TAKE 1 TABLET TWICE A DAY (Patient taking differently: 1/2 tablet am and 1 tablet pm)   CVS CHEWABLE C WITH ROSE HIPS 500 MG CHEW TAKE 1 TABLET BY MOUTH EVERY DAY   diclofenac Sodium (VOLTAREN) 1 % GEL Apply four times a day to hip.   diphenoxylate-atropine (LOMOTIL) 2.5-0.025 MG tablet Take 1 tablet by mouth 4 (four) times daily as needed for diarrhea or loose stools.   fluticasone (FLONASE) 50 MCG/ACT nasal spray USE 2 SPRAYS IN EACH NOSTRIL DAILY   folic acid (FOLVITE) 1 MG tablet TAKE 1 TABLET BY MOUTH EVERY DAY   levothyroxine (SYNTHROID) 50 MCG tablet TAKE 1 TABLET BY MOUTH EVERY DAY   metoprolol tartrate (LOPRESSOR) 50 MG tablet Take 1 tablet (50 mg total) by mouth 2 (two) times daily.   Multiple Vitamins-Minerals (ZINC PO) Take 1 tablet by mouth daily.   potassium chloride (MICRO-K) 10 MEQ CR capsule TAKE 4 CAPSULES BY MOUTH EVERY DAY   Zinc Sulfate (ZINC-220 PO) Take by mouth.   No facility-administered encounter medications on file as of 10/10/2020.    Unable to reach  patient   Care Gaps: AWV-none noted  Star Rating Drugs: Levothyroxine  08/12/20 90 Metoprolol  08/05/20  [place], [place] Pharmacist Assistant [phone]

## 2020-10-15 ENCOUNTER — Telehealth: Payer: Medicare HMO

## 2020-10-20 ENCOUNTER — Other Ambulatory Visit: Payer: Self-pay | Admitting: Family Medicine

## 2020-11-26 ENCOUNTER — Ambulatory Visit (INDEPENDENT_AMBULATORY_CARE_PROVIDER_SITE_OTHER): Payer: Medicare HMO

## 2020-11-26 ENCOUNTER — Other Ambulatory Visit: Payer: Self-pay

## 2020-11-26 DIAGNOSIS — E782 Mixed hyperlipidemia: Secondary | ICD-10-CM

## 2020-11-26 DIAGNOSIS — I10 Essential (primary) hypertension: Secondary | ICD-10-CM | POA: Diagnosis not present

## 2020-11-26 NOTE — Progress Notes (Signed)
Chronic Care Management Pharmacy Note  11/27/2020 Name:  Cynthia Gardner MRN:  [id number] DOB:  1945/12/12   Plan Updates:   Reports more knee stiffness lately. Discussed options for exercise like stationary bike. Patient to schedule appointment with Dr. Tobie Poet.  Patient reports Eliquis is expensive right now but not eligible for patient assistance. Pharmacist pulled samples of Eliquis for patient to pick up.  Patient is overdue for regular visit with updated blood work. Pharmacist coordinated patient scheduling follow-up visit.   Subjective: Cynthia Gardner is an 75 y.o. year old female who is a primary patient of Cox, Kirsten, MD.  The CCM team was consulted for assistance with disease management and care coordination needs.    Engaged with patient by telephone for follow up visit in response to provider referral for pharmacy case management and/or care coordination services.   Consent to Services:  The patient was given information about Chronic Care Management services, agreed to services, and gave verbal consent prior to initiation of services.  Please see initial visit note for detailed documentation.   Patient Care Team: Rochel Brome, MD as PCP - General (Family Medicine) Burnice Logan, [facility] as Pharmacist (Pharmacist) Marcy Panning, MD as Referring Physician (Oncology)  Recent office visits: 05/01/2020 - Eliquis samples provided. Lipids not controlled. LDL 108 - work on healthier eating/exercise.  03/16/2020 - cefdinir 300 mg bid for 10 days. Continue mucinex. Chest x-ray if not improved by Monday.  03/09/2020 - sinusitis - zithromax rx ordered.  Recent consult visits: None reported   Hospital visits: None in previous 6 months  Objective:  Lab Results  Component Value Date   CREATININE 1.34 (H) 05/07/2020   BUN 24 05/07/2020   GFRNONAA 39 (L) 05/07/2020   GFRAA 45 (L) 05/07/2020   NA 141 05/07/2020   K 4.7 05/07/2020   CALCIUM 9.1 05/07/2020   CO2 20 05/07/2020    Lab  Results  Component Value Date/Time   HGBA1C 5.5 12/26/2019 09:21 AM   HGBA1C 6.1 (H) 04/06/2019 10:00 AM    Last diabetic Eye exam: No results found for: HMDIABEYEEXA  Last diabetic Foot exam: No results found for: HMDIABFOOTEX   Lab Results  Component Value Date   CHOL 175 05/07/2020   HDL 42 05/07/2020   LDLCALC 108 (H) 05/07/2020   TRIG 143 05/07/2020   CHOLHDL 4.2 05/07/2020    Hepatic Function Latest Ref Rng & Units 05/07/2020 12/26/2019 09/12/2019  Total Protein 6.0 - 8.5 g/dL 6.7 6.4 6.5  Albumin 3.7 - 4.7 g/dL 4.2 4.1 4.0  AST 0 - 40 IU/L _0 ALT 0 - 32 IU/L _1 Alk Phosphatase 44 - 121 IU/L 112 99 92  Total Bilirubin 0.0 - 1.2 mg/dL 0.8 0.6 0.9    Lab Results  Component Value Date/Time   TSH 3.260 05/07/2020 11:40 AM   TSH 3.860 09/12/2019 12:00 AM    CBC Latest Ref Rng & Units 05/07/2020 12/26/2019 09/12/2019  WBC 3.4 - 10.8 x10E3/uL 35.2(HH) 33.4(HH) 24.9(HH)  Hemoglobin 11.1 - 15.9 g/dL 12.7 12.1 12.0  Hematocrit 34.0 - 46.6 % 37.8 36.5 37.1  Platelets 150 - 450 x10E3/uL 299 266 270    Lab Results  Component Value Date/Time   VD25OH 28.0 (L) 12/26/2019 09:21 AM    Clinical ASCVD: Yes  The ASCVD Risk score Mikey Bussing DC Jr., et al., 2013) failed to calculate for the following reasons:   The patient has a prior MI or stroke diagnosis  Depression screen [facility] 2/9 09/19/2019  Decreased Interest 0  Down, Depressed, Hopeless 0  PHQ - 2 Score 0   Social History   Tobacco Use  Smoking Status Former  Smokeless Tobacco Never   BP Readings from Last 3 Encounters:  05/01/20 115/80  03/16/20 107/77  12/26/19 108/60   Pulse Readings from Last 3 Encounters:  05/01/20 70  03/16/20 67  12/26/19 (!) 56   Wt Readings from Last 3 Encounters:  05/01/20 173 lb (78.5 kg)  12/26/19 178 lb 9.6 oz (81 kg)  09/19/19 175 lb (79.4 kg)    Assessment/Interventions: Review of patient past medical history, allergies, medications, health status, including review of  consultants reports, laboratory and other test data, was performed as part of comprehensive evaluation and provision of chronic care management services.   SDOH:  (Social Determinants of Health) assessments and interventions performed: Yes   CCM Care Plan  No Known Allergies  Medications Reviewed Today     Reviewed by Burnice Logan, [facility] (Pharmacist) on 11/26/20 at 1009  Med List Status: <None>   Medication Order Taking? Sig Documenting Provider Last Dose Status Informant  amoxicillin (AMOXIL) 875 MG tablet [id number] No Take 1 tablet (875 mg total) by mouth 2 (two) times daily.  Patient not taking: Reported on 11/26/2020   CoxElnita Maxwell, MD Not Taking Consider Medication Status and Discontinue   apixaban (ELIQUIS) 5 MG TABS tablet [id number] Yes Take 1 tablet (5 mg total) by mouth 2 (two) times daily. Cox, Kirsten, MD Taking Active   cetirizine (ZYRTEC) 10 MG tablet [id number] Yes TAKE 1 TABLET BY MOUTH EVERY DAY Cox, Kirsten, MD Taking Active   chlorthalidone (HYGROTON) 25 MG tablet [id number] Yes TAKE 1 TABLET BY MOUTH EVERY DAY Cox, Kirsten, MD Taking Active   Cholecalciferol (VITAMIN D3 PO) [id number] Yes Take 1 tablet by mouth daily. [provider] Taking Active Self  cloNIDine (CATAPRES) 0.3 MG tablet [id number] Yes TAKE 1 TABLET BY MOUTH TWICE A DAY Marge Duncans, PA-C Taking Active   [facility] CHEWABLE C WITH ROSE HIPS 500 MG CHEW [id number] Yes TAKE 1 TABLET BY MOUTH EVERY DAY Cox, Kirsten, MD Taking Active   diclofenac Sodium (VOLTAREN) 1 % GEL [id number] Yes Apply four times a day to hip. Cox, Kirsten, MD Taking Active   diphenoxylate-atropine (LOMOTIL) 2.5-0.025 MG tablet [id number] Yes Take 1 tablet by mouth 4 (four) times daily as needed for diarrhea or loose stools. Cox, Kirsten, MD Taking Active   fluticasone Asencion Islam) 50 MCG/ACT nasal spray [id number] Yes USE 2 SPRAYS IN EACH NOSTRIL DAILY Cox, Kirsten, MD Taking Active   folic acid (FOLVITE) 1 MG tablet [id number] Yes TAKE 1 TABLET  BY MOUTH EVERY DAY Cox, Kirsten, MD Taking Active   levothyroxine (SYNTHROID) 50 MCG tablet [id number] Yes TAKE 1 TABLET BY MOUTH EVERY DAY Lillard Anes, MD Taking Active   metoprolol tartrate (LOPRESSOR) 50 MG tablet [id number] Yes Take 1 tablet (50 mg total) by mouth 2 (two) times daily. Thurnell Lose, MD Taking Active   Multiple Vitamins-Minerals (ZINC PO) [id number] Yes Take 1 tablet by mouth daily. [provider] Taking Active Self  potassium chloride (MICRO-K) 10 MEQ CR capsule [id number] Yes TAKE 4 CAPSULES BY MOUTH EVERY DAY Marge Duncans, PA-C Taking Active   Zinc Sulfate (ZINC-220 PO) [id number] Yes Take by mouth. [provider] Taking Active             Patient Active Problem List   Diagnosis Date Noted   Anticoagulant long-term  use 12/20/2019   Mixed hyperlipidemia 09/19/2019   Prediabetes 09/19/2019   HTN (hypertension) 04/06/2019   Hypothyroidism 04/06/2019   CLL (chronic lymphocytic leukemia) ([place]) 04/06/2019   CVA (cerebral vascular accident) ([place]) 04/06/2019   Atrial fibrillation with RVR ([place]) 04/06/2019   Antiphospholipid syndrome ([place]) 03/05/2017    Immunization History  Administered Date(s) Administered   Fluad Quad(high Dose 65+) 12/26/2019   Moderna SARS-COV2 Booster Vaccination 08/13/2020   Moderna Sars-Covid-2 Vaccination 05/20/2019, 06/17/2019, 01/23/2020   Pneumococcal Conjugate-13 01/31/2015   Pneumococcal Polysaccharide-23 01/06/2013    Conditions to be addressed/monitored:  Hypertension, Hyperlipidemia, Atrial Fibrillation, Hypothyroidism and Prediabetes  Care Plan : [place]  Updates made by Burnice Logan, [place] since 11/27/2020 12:00 AM     Problem: htn, hld   Priority: High  Onset Date: 06/18/2020     Long-Range Goal: Disease Management   Start Date: 06/18/2020  Expected End Date: 06/18/2021  Recent Progress: On track  Priority: High  Note:   Current Barriers:  Unable to achieve control of  cholesterol evidcened by elevated LDL.    Pharmacist Clinical Goal(s):  Over the next 90 days, patient will achieve control of choleterol as evidenced by LDL through collaboration with PharmD and provider.    Interventions: 1:1 collaboration with Cox, Elnita Maxwell, MD regarding development and update of comprehensive plan of care as evidenced by provider attestation and co-signature Inter-disciplinary care team collaboration (see longitudinal plan of care) Comprehensive medication review performed; medication list updated in electronic medical record  Hypertension (BP goal <130/80) -controlled -Current treatment: Chlorthalidone 25 mg daily  Clonidine 0.3 mg take 1/2 tablet in the morning and 1 tablet in the evening  Metoprolol tartrate 50 mg bid   -Medications previously tried: none reported  -Current home readings: home readings are good/well below goal since COVID -Current dietary habits:  plans to cut back on potassium.  -Current exercise habits:  denies regular exercise. - encouraged patient to consider bike riding to help with knee stiffness -Denies hypotensive/hypertensive symptoms -Educated on BP goals and benefits of medications for prevention of heart attack, stroke and kidney damage; Daily salt intake goal < 2300 mg; Exercise goal of 150 minutes per week; Importance of home blood pressure monitoring; -Counseled to monitor BP at home weekly, document, and provide log at future appointments -Counseled on diet and exercise extensively Recommended to continue current medication Counseled on healthy diet and exercise. Monitoring blood pressure.   Hyperlipidemia: (LDL goal < 70) -not controlled - patient is overdue for updates labs/appointment. Pharmacist coordinating appointment follow-up with patient.  -Current treatment: Diet and lifestyle  -Medications previously tried: none reported -Current dietary patterns: Patient plans to back off of bacon and other fatty meats. Discussed  including more fruits, vegetables and lean protein.  -Current exercise habits: none currently -Educated on Cholesterol goals;  Benefits of statin for ASCVD risk reduction; Importance of limiting foods high in cholesterol; Exercise goal of 150 minutes per week; -Counseled on diet and exercise extensively Recommended patient begin riding stationary bike or walking for exercise.   Patient Goals/Self-Care Activities Over the next 90 days, patient will:  - take medications as prescribed focus on medication adherence by using pill box target a minimum of 150 minutes of moderate intensity exercise weekly engage in dietary modifications by eating plenty of fruits and vegetables. Working to focus on lean meats for protein.   Follow Up Plan: Telephone follow up appointment with care management team member scheduled for: 01/2021 to review updated cholesterol panel  Medication Assistance: None required.  Patient affirms current coverage meets needs.  [facility]  [id number] [place] [id number]: [phone]: [phone] [facility] [address] [id number]: [phone]: [phone]  [id number] [place] [id number] [place]: [phone]: [phone] pill box? Yes Pt endorses good compliance  We discussed: Current pharmacy is preferred with insurance plan and patient is satisfied with pharmacy services Patient decided to: Continue current medication management strategy  Care Plan and Follow Up Patient Decision:  Patient agrees to Care Plan and Follow-up.  Plan: Telephone follow up appointment with care management team member scheduled for:  01/2021 to follow-up on cholesterol management

## 2020-11-27 NOTE — Patient Instructions (Signed)
Visit Information   Goals Addressed             This Visit's Progress    Learn More About My Health       Timeframe:  Long-Range Goal Priority:  High Start Date:       06/18/2020                    Expected End Date:    06/18/2021               Follow Up Date 01/2021   - ask questions - repeat what I heard to make sure I understand - bring a list of my medicines to the visit - speak up when I don't understand    Why is this important?   The best way to learn about your health and care is by talking to the doctor and nurse.  They will answer your questions and give you information in the way that you like best.    Notes:      Lifestyle Change-Hypertension       Timeframe:  Long-Range Goal Priority:  High Start Date:        06/18/2020                     Expected End Date:   06/18/2021                    Follow Up Date 01/2021    - ask questions to understand    Why is this important?   The changes that you are asked to make may be hard to do.  This is especially true when the changes are life-long.  Knowing why it is important to you is the first step.  Working on the change with your family or support person helps you not feel alone.  Reward yourself and family or support person when goals are met. This can be an activity you choose like bowling, hiking, biking, swimming or shooting hoops.     Notes:      Manage My Medicine       Timeframe:  Long-Range Goal Priority:  High Start Date:    06/18/2020                         Expected End Date:       06/18/2021                Follow Up Date 01/2021   - call for medicine refill 2 or 3 days before it runs out - keep a list of all the medicines I take; vitamins and herbals too - use a pillbox to sort medicine    Why is this important?   These steps will help you keep on track with your medicines.   Notes:        Patient Care Plan: CCM Pharmacy Care Plan     Problem Identified: htn, hld   Priority: High   Onset Date: 06/18/2020     Long-Range Goal: Disease Management   Start Date: 06/18/2020  Expected End Date: 06/18/2021  Recent Progress: On track  Priority: High  Note:   Current Barriers:  Unable to achieve control of cholesterol evidcened by elevated LDL.    Pharmacist Clinical Goal(s):  Over the next 90 days, patient will achieve control of choleterol as evidenced by LDL through collaboration with PharmD and provider.    Interventions: 1:1  collaboration with Rochel Brome, MD regarding development and update of comprehensive plan of care as evidenced by provider attestation and co-signature Inter-disciplinary care team collaboration (see longitudinal plan of care) Comprehensive medication review performed; medication list updated in electronic medical record  Hypertension (BP goal <130/80) -controlled -Current treatment: Chlorthalidone 25 mg daily  Clonidine 0.3 mg take 1/2 tablet in the morning and 1 tablet in the evening  Metoprolol tartrate 50 mg bid   -Medications previously tried: none reported  -Current home readings: home readings are good/well below goal since COVID -Current dietary habits:  plans to cut back on potassium.  -Current exercise habits:  denies regular exercise. - encouraged patient to consider bike riding to help with knee stiffness -Denies hypotensive/hypertensive symptoms -Educated on BP goals and benefits of medications for prevention of heart attack, stroke and kidney damage; Daily salt intake goal < 2300 mg; Exercise goal of 150 minutes per week; Importance of home blood pressure monitoring; -Counseled to monitor BP at home weekly, document, and provide log at future appointments -Counseled on diet and exercise extensively Recommended to continue current medication Counseled on healthy diet and exercise. Monitoring blood pressure.   Hyperlipidemia: (LDL goal < 70) -not controlled - patient is overdue for updates labs/appointment. Pharmacist  coordinating appointment follow-up with patient.  -Current treatment: Diet and lifestyle  -Medications previously tried: none reported -Current dietary patterns: Patient plans to back off of bacon and other fatty meats. Discussed including more fruits, vegetables and lean protein.  -Current exercise habits: none currently -Educated on Cholesterol goals;  Benefits of statin for ASCVD risk reduction; Importance of limiting foods high in cholesterol; Exercise goal of 150 minutes per week; -Counseled on diet and exercise extensively Recommended patient begin riding stationary bike or walking for exercise.   Patient Goals/Self-Care Activities Over the next 90 days, patient will:  - take medications as prescribed focus on medication adherence by using pill box target a minimum of 150 minutes of moderate intensity exercise weekly engage in dietary modifications by eating plenty of fruits and vegetables. Working to focus on lean meats for protein.   Follow Up Plan: Telephone follow up appointment with care management team member scheduled for: 01/2021 to review updated cholesterol panel       The patient verbalized understanding of instructions, educational materials, and care plan provided today and declined offer to receive copy of patient instructions, educational materials, and care plan.  Telephone follow up appointment with pharmacy team member scheduled for: 01/2021  Burnice Logan, [facility]

## 2020-12-18 ENCOUNTER — Other Ambulatory Visit: Payer: Self-pay

## 2020-12-18 ENCOUNTER — Ambulatory Visit: Payer: Medicare HMO

## 2020-12-18 DIAGNOSIS — C9110 Chronic lymphocytic leukemia of B-cell type not having achieved remission: Secondary | ICD-10-CM | POA: Diagnosis not present

## 2020-12-25 ENCOUNTER — Encounter: Payer: Self-pay | Admitting: Family Medicine

## 2020-12-25 ENCOUNTER — Ambulatory Visit (INDEPENDENT_AMBULATORY_CARE_PROVIDER_SITE_OTHER): Payer: Medicare HMO | Admitting: Family Medicine

## 2020-12-25 ENCOUNTER — Other Ambulatory Visit: Payer: Self-pay

## 2020-12-25 VITALS — BP 132/76 | HR 55 | Temp 94.6°F | Ht 66.0 in | Wt 179.0 lb

## 2020-12-25 DIAGNOSIS — E038 Other specified hypothyroidism: Secondary | ICD-10-CM | POA: Diagnosis not present

## 2020-12-25 DIAGNOSIS — D6869 Other thrombophilia: Secondary | ICD-10-CM | POA: Diagnosis not present

## 2020-12-25 DIAGNOSIS — Z1231 Encounter for screening mammogram for malignant neoplasm of breast: Secondary | ICD-10-CM | POA: Diagnosis not present

## 2020-12-25 DIAGNOSIS — Z1211 Encounter for screening for malignant neoplasm of colon: Secondary | ICD-10-CM

## 2020-12-25 DIAGNOSIS — C9110 Chronic lymphocytic leukemia of B-cell type not having achieved remission: Secondary | ICD-10-CM | POA: Diagnosis not present

## 2020-12-25 DIAGNOSIS — I480 Paroxysmal atrial fibrillation: Secondary | ICD-10-CM

## 2020-12-25 DIAGNOSIS — Z23 Encounter for immunization: Secondary | ICD-10-CM

## 2020-12-25 DIAGNOSIS — I10 Essential (primary) hypertension: Secondary | ICD-10-CM | POA: Diagnosis not present

## 2020-12-25 DIAGNOSIS — E782 Mixed hyperlipidemia: Secondary | ICD-10-CM | POA: Diagnosis not present

## 2020-12-25 DIAGNOSIS — Z13820 Encounter for screening for osteoporosis: Secondary | ICD-10-CM

## 2020-12-25 DIAGNOSIS — R7303 Prediabetes: Secondary | ICD-10-CM

## 2020-12-25 DIAGNOSIS — D6861 Antiphospholipid syndrome: Secondary | ICD-10-CM | POA: Diagnosis not present

## 2020-12-25 NOTE — Patient Instructions (Signed)
Apply voltaren gel 4 times a day

## 2020-12-25 NOTE — Assessment & Plan Note (Addendum)
At goal.   Recommend continue to work on eating healthy diet and exercise.

## 2020-12-25 NOTE — Assessment & Plan Note (Addendum)
Stable.  Hemoglobin A1c is 5.7. Recommend check every 6 months. Continue to work on eating a healthy diet and exercise.

## 2020-12-25 NOTE — Progress Notes (Signed)
Subjective:  Patient ID: Cynthia Gardner, female    DOB: [birth year]  Age: 75 y.o. MRN: [id number]  Chief Complaint  Patient presents with   Hypertension     HPI Hypertension: Complications: cva Current medications: Chlorthalidone 25 mg qd, Clonidine 0.3 mg bid, Metoprolol 50 mg bid.   Chronic lymphocytic leukemia: This is monitored and managed by Dr. Humphrey Rolls.  Patient's most recent white blood cell count was approximately 26,000 which is improved.  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  Atrial fibrillation: This occurred in January 2021 following a complicated course of PRFFM-38.  It has not recurred.  Due to the patient's history of antiphospholipid antibody syndrome it is appropriate to continue her Eliquis 2.5 mg twice daily. She is also on metoprolol 50 mg twice daily.  Diet: Eating healthy. Exercise: walking daily  Current Outpatient Medications on File Prior to Visit  Medication Sig Dispense Refill   apixaban (ELIQUIS) 5 MG TABS tablet Take 1 tablet (5 mg total) by mouth 2 (two) times daily. 60 tablet 0   cetirizine (ZYRTEC) 10 MG tablet TAKE 1 TABLET BY MOUTH EVERY DAY 90 tablet 3   chlorthalidone (HYGROTON) 25 MG tablet TAKE 1 TABLET BY MOUTH EVERY DAY 90 tablet 1   Cholecalciferol (VITAMIN D3 PO) Take 1 tablet by mouth daily.     cloNIDine (CATAPRES) 0.3 MG tablet TAKE 1 TABLET BY MOUTH TWICE A DAY 180 tablet 1   [facility] CHEWABLE C WITH ROSE HIPS 500 MG CHEW TAKE 1 TABLET BY MOUTH EVERY DAY 100 tablet 1   diclofenac Sodium (VOLTAREN) 1 % GEL Apply four times a day to hip. 1000 g 1   diphenoxylate-atropine (LOMOTIL) 2.5-0.025 MG tablet Take 1 tablet by mouth 4 (four) times daily as needed for diarrhea or loose stools. 60 tablet 2   fluticasone (FLONASE) 50 MCG/ACT nasal spray USE 2 SPRAYS IN EACH NOSTRIL DAILY 48 mL 5   folic acid (FOLVITE) 1 MG tablet TAKE 1 TABLET BY MOUTH EVERY DAY 90 tablet 1   metoprolol tartrate (LOPRESSOR) 50 MG tablet  Take 1 tablet (50 mg total) by mouth 2 (two) times daily. 60 tablet 0   Multiple Vitamins-Minerals (ZINC PO) Take 1 tablet by mouth daily.     potassium chloride (MICRO-K) 10 MEQ CR capsule TAKE 4 CAPSULES BY MOUTH EVERY DAY 360 capsule 1   Zinc Sulfate (ZINC-220 PO) Take by mouth.     No current facility-administered medications on file prior to visit.   Past Medical History:  Diagnosis Date   Antiphospholipid antibody syndrome (HCC)    CVA (cerebral vascular accident) ([place]) 04/06/2019   History of COVID-19    HTN (hypertension) 04/06/2019   Hypothyroidism 04/06/2019   Leukemia ([place])    Past Surgical History:  Procedure Laterality Date   CATARACT EXTRACTION Right    COLON RESECTION  2008   ischemic bowel    Family History  Problem Relation Age of Onset   Parkinson's disease Father    Cirrhosis Brother        from agent orange   Hypertension Other    Diabetes Other    Social History   Socioeconomic History   Marital status: Married    Spouse name: Not on file   Number of children: 2   Years of education: Not on file   Highest education level: Not on file  Occupational History   Not on file  Tobacco Use   Smoking status: Former   Smokeless  tobacco: Never  Vaping Use   Vaping Use: Never used  Substance and Sexual Activity   Alcohol use: Not Currently   Drug use: Never   Sexual activity: Not Currently    Birth control/protection: None  Other Topics Concern   Not on file  Social History Narrative   Not on file   Social Determinants of Health   Financial Resource Strain: Low Risk    Difficulty of Paying Living Expenses: Not hard at all  Food Insecurity: No Food Insecurity   Worried About Charity fundraiser in the Last Year: Never true   [place] in the Last Year: Never true  Transportation Needs: No Transportation Needs   Lack of Transportation (Medical): No   Lack of Transportation (Non-Medical): No  Physical Activity: Inactive   Days of Exercise per  Week: 0 days   Minutes of Exercise per Session: 0 min  Stress: No Stress Concern Present   Feeling of Stress : Not at all  Social Connections: Socially Integrated   Frequency of Communication with Friends and Family: More than three times a week   Frequency of Social Gatherings with Friends and Family: Three times a week   Attends Religious Services: More than 4 times per year   Active Member of Clubs or Organizations: Yes   Attends Archivist Meetings: Never   Marital Status: Married    Review of Systems  Constitutional:  Negative for chills, fatigue and fever.  HENT:  Negative for congestion, ear pain, rhinorrhea and sore throat.   Respiratory:  Negative for cough and shortness of breath.   Cardiovascular:  Negative for chest pain.  Gastrointestinal:  Negative for abdominal pain, constipation, diarrhea, nausea and vomiting.  Genitourinary:  Negative for dysuria and urgency.  Musculoskeletal:  Positive for arthralgias. Negative for back pain and myalgias.  Neurological:  Negative for dizziness, weakness, light-headedness and headaches.  Psychiatric/Behavioral:  Negative for dysphoric mood. The patient is not nervous/anxious.     Objective:  BP 132/76   Pulse (!) 55   Temp (!) 94.6 F (34.8 C)   Ht 5\' 6"  (1.676 m)   Wt 179 lb (81.2 kg)   SpO2 99%   BMI 28.89 kg/m   BP/Weight 12/27/2020 11/16/2991 09/28/6965  Systolic BP - 893 810  Diastolic BP - 76 80  Wt. (Lbs) 179 179 173  BMI 28.89 28.89 27.92    Physical Exam Vitals reviewed.  Constitutional:      Appearance: Normal appearance. She is normal weight.  Neck:     Vascular: No carotid bruit.  Cardiovascular:     Rate and Rhythm: Normal rate and regular rhythm.     Pulses: Normal pulses.     Heart sounds: Murmur heard.  Pulmonary:     Effort: Pulmonary effort is normal. No respiratory distress.     Breath sounds: Normal breath sounds.  Abdominal:     General: Abdomen is flat. Bowel sounds are normal.      Palpations: Abdomen is soft.     Tenderness: There is no abdominal tenderness.  Neurological:     Mental Status: She is alert and oriented to person, place, and time.  Psychiatric:        Mood and Affect: Mood normal.        Behavior: Behavior normal.    Diabetic Foot Exam - Simple   No data filed      Lab Results  Component Value Date   WBC 35.2 (HH) 05/07/2020  HGB 12.7 05/07/2020   HCT 37.8 05/07/2020   PLT 299 05/07/2020   GLUCOSE 105 (H) 05/07/2020   CHOL 152 12/25/2020   TRIG 110 12/25/2020   HDL 41 12/25/2020   LDLCALC 91 12/25/2020   ALT 14 05/07/2020   AST 21 05/07/2020   NA 141 05/07/2020   K 4.7 05/07/2020   CL 108 (H) 05/07/2020   CREATININE 1.34 (H) 05/07/2020   BUN 24 05/07/2020   CO2 20 05/07/2020   TSH 5.960 (H) 12/25/2020   HGBA1C 5.7 (H) 12/25/2020      Assessment & Plan:   Problem List Items Addressed This Visit       Cardiovascular and Mediastinum   HTN (hypertension) - Primary (Chronic)    Well controlled.  No medication changes recommended. Continue healthy diet and exercise.  Labs drawn      Paroxysmal atrial fibrillation (HCC)    Likely secondary to COVID-19 however patient is at high risk for blood clots and strokes due to her antiphospholipid antibody syndrome.  Continue Eliquis 2.5 mg twice daily.        Endocrine   Hypothyroidism (Chronic)    Well-controlled on Synthroid 50 mcg once daily in AM.      Relevant Orders   TSH (Completed)     Hematopoietic and Hemostatic   Antiphospholipid syndrome ([place])    Patient is at high risk for clots.  Continue Eliquis 2.5 mg twice daily.      Acquired thrombophilia ([place])    Due to being on eliquis.  Low risk of bleeding as pt has coagulation disorder (antiphospholipid syndrome.)        Other   CLL (chronic lymphocytic leukemia) (HCC) (Chronic)    Last blood count had improved.   Continue regular follow-up with Dr. Humphrey Rolls.       Mixed hyperlipidemia    At goal.    Recommend continue to work on eating healthy diet and exercise.      Relevant Orders   Lipid panel (Completed)   Prediabetes    Stable.  Hemoglobin A1c is 5.7. Recommend check every 6 months. Continue to work on eating a healthy diet and exercise.        Relevant Orders   Hemoglobin A1c (Completed)   Osteoporosis screening    Order DEXA. Recommend calcium and vitamin D 500 to 600 mg twice daily      Relevant Orders   DG Bone Density   Visit for screening mammogram    Order mammogram      Relevant Orders   MM DIGITAL SCREENING BILATERAL   Colon cancer screening    Family history of colon cancer.  Performed for colonoscopy.      Relevant Orders   Ambulatory referral to Gastroenterology   Other Visit Diagnoses     Need for immunization against influenza       Relevant Orders   Flu Vaccine QUAD High Dose(Fluad) (Completed)     .  No orders of the defined types were placed in this encounter.   Orders Placed This Encounter  Procedures   DG Bone Density   MM DIGITAL SCREENING BILATERAL   Flu Vaccine QUAD High Dose(Fluad)   Hemoglobin A1c   Lipid panel   TSH   Cardiovascular Risk Assessment   Ambulatory referral to Gastroenterology      Follow-up: Return in about 3 months (around 03/26/2021) for fasting.  An After Visit Summary was printed and given to the patient.  Rochel Brome, MD [facility] [phone]

## 2020-12-25 NOTE — Assessment & Plan Note (Signed)
Well controlled.  No medication changes recommended. Continue healthy diet and exercise.  Labs drawn

## 2020-12-26 LAB — LIPID PANEL
Chol/HDL Ratio: 3.7 ratio (ref 0.0–4.4)
Cholesterol, Total: 152 mg/dL (ref 100–199)
HDL: 41 mg/dL
LDL Chol Calc (NIH): 91 mg/dL (ref 0–99)
Triglycerides: 110 mg/dL (ref 0–149)
VLDL Cholesterol Cal: 20 mg/dL (ref 5–40)

## 2020-12-26 LAB — TSH: TSH: 5.96 u[IU]/mL — ABNORMAL HIGH (ref 0.450–4.500)

## 2020-12-26 LAB — HEMOGLOBIN A1C
Est. average glucose Bld gHb Est-mCnc: 117 mg/dL
Hgb A1c MFr Bld: 5.7 % — ABNORMAL HIGH (ref 4.8–5.6)

## 2020-12-27 ENCOUNTER — Ambulatory Visit: Payer: Medicare HMO

## 2020-12-27 ENCOUNTER — Other Ambulatory Visit: Payer: Self-pay

## 2020-12-27 ENCOUNTER — Ambulatory Visit (INDEPENDENT_AMBULATORY_CARE_PROVIDER_SITE_OTHER): Payer: Medicare HMO

## 2020-12-27 VITALS — Ht 66.0 in | Wt 179.0 lb

## 2020-12-27 DIAGNOSIS — Z1211 Encounter for screening for malignant neoplasm of colon: Secondary | ICD-10-CM | POA: Diagnosis not present

## 2020-12-27 DIAGNOSIS — Z0000 Encounter for general adult medical examination without abnormal findings: Secondary | ICD-10-CM | POA: Diagnosis not present

## 2020-12-27 NOTE — Patient Instructions (Signed)
Health Maintenance, Female Adopting a healthy lifestyle and getting preventive care are important in promoting health and wellness. Ask your health care provider about: The right schedule for you to have regular tests and exams. Things you can do on your own to prevent diseases and keep yourself healthy. What should I know about diet, weight, and exercise? Eat a healthy diet  Eat a diet that includes plenty of vegetables, fruits, low-fat dairy products, and lean protein. Do not eat a lot of foods that are high in solid fats, added sugars, or sodium. Maintain a healthy weight Body mass index (BMI) is used to identify weight problems. It estimates body fat based on height and weight. Your health care provider can help determine your BMI and help you achieve or maintain a healthy weight. Get regular exercise Get regular exercise. This is one of the most important things you can do for your health. Most adults should: Exercise for at least 150 minutes each week. The exercise should increase your heart rate and make you sweat (moderate-intensity exercise). Do strengthening exercises at least twice a week. This is in addition to the moderate-intensity exercise. Spend less time sitting. Even light physical activity can be beneficial. Watch cholesterol and blood lipids Have your blood tested for lipids and cholesterol at 76 years of age, then have this test every 5 years. Have your cholesterol levels checked more often if: Your lipid or cholesterol levels are high. You are older than 76 years of age. You are at high risk for heart disease. What should I know about cancer screening? Depending on your health history and family history, you may need to have cancer screening at various ages. This may include screening for: Breast cancer. Cervical cancer. Colorectal cancer. Skin cancer. Lung cancer. What should I know about heart disease, diabetes, and high blood pressure? Blood pressure and heart  disease High blood pressure causes heart disease and increases the risk of stroke. This is more likely to develop in people who have high blood pressure readings, are of African descent, or are overweight. Have your blood pressure checked: Every 3-5 years if you are 18-39 years of age. Every year if you are 40 years old or older. Diabetes Have regular diabetes screenings. This checks your fasting blood sugar level. Have the screening done: Once every three years after age 40 if you are at a normal weight and have a low risk for diabetes. More often and at a younger age if you are overweight or have a high risk for diabetes. What should I know about preventing infection? Hepatitis B If you have a higher risk for hepatitis B, you should be screened for this virus. Talk with your health care provider to find out if you are at risk for hepatitis B infection. Hepatitis C Testing is recommended for: Everyone born from 1945 through 1965. Anyone with known risk factors for hepatitis C. Sexually transmitted infections (STIs) Get screened for STIs, including gonorrhea and chlamydia, if: You are sexually active and are younger than 76 years of age. You are older than 76 years of age and your health care provider tells you that you are at risk for this type of infection. Your sexual activity has changed since you were last screened, and you are at increased risk for chlamydia or gonorrhea. Ask your health care provider if you are at risk. Ask your health care provider about whether you are at high risk for HIV. Your health care provider may recommend a prescription medicine   to help prevent HIV infection. If you choose to take medicine to prevent HIV, you should first get tested for HIV. You should then be tested every 3 months for as long as you are taking the medicine. Pregnancy If you are about to stop having your period (premenopausal) and you may become pregnant, seek counseling before you get  pregnant. Take 400 to 800 micrograms (mcg) of folic acid every day if you become pregnant. Ask for birth control (contraception) if you want to prevent pregnancy. Osteoporosis and menopause Osteoporosis is a disease in which the bones lose minerals and strength with aging. This can result in bone fractures. If you are 65 years old or older, or if you are at risk for osteoporosis and fractures, ask your health care provider if you should: Be screened for bone loss. Take a calcium or vitamin D supplement to lower your risk of fractures. Be given hormone replacement therapy (HRT) to treat symptoms of menopause. Follow these instructions at home: Lifestyle Do not use any products that contain nicotine or tobacco, such as cigarettes, e-cigarettes, and chewing tobacco. If you need help quitting, ask your health care provider. Do not use street drugs. Do not share needles. Ask your health care provider for help if you need support or information about quitting drugs. Alcohol use Do not drink alcohol if: Your health care provider tells you not to drink. You are pregnant, may be pregnant, or are planning to become pregnant. If you drink alcohol: Limit how much you use to 0-1 drink a day. Limit intake if you are breastfeeding. Be aware of how much alcohol is in your drink. In the [place] one drink equals one 12 oz bottle of beer (355 mL), one 5 oz glass of wine (148 mL), or one 1 oz glass of hard liquor (44 mL). General instructions Schedule regular health, dental, and eye exams. Stay current with your vaccines. Tell your health care provider if: You often feel depressed. You have ever been abused or do not feel safe at home. Summary Adopting a healthy lifestyle and getting preventive care are important in promoting health and wellness. Follow your health care provider's instructions about healthy diet, exercising, and getting tested or screened for diseases. Follow your health care provider's  instructions on monitoring your cholesterol and blood pressure. This information is not intended to replace advice given to you by your health care provider. Make sure you discuss any questions you have with your health care provider. Document Revised: 05/25/2020 Document Reviewed: 03/10/2018 Elsevier Patient Education  2022 [employer]  

## 2020-12-27 NOTE — Progress Notes (Signed)
Subjective:   Cynthia Gardner is a 75 y.o. female who presents for an Initial Medicare Annual Wellness Visit.  I connected with  Cynthia Gardner on 12/27/20 by an audio only telemedicine application and verified that I am speaking with the correct person using two identifiers.   I discussed the limitations, risks, security and privacy concerns of performing an evaluation and management service by telephone and the availability of in person appointments. I also discussed with the patient that there may be a patient responsible charge related to this service. The patient expressed understanding and verbally consented to this telephonic visit.  Location of Patient: Home Location of Provider: Office  Cynthia Gardner and [place], [place].  Review of Systems    Defer to PCP.  Cardiac Risk Factors include: dyslipidemia;sedentary lifestyle     Objective:    Today's Vitals   12/27/20 1027  Weight: 179 lb (81.2 kg)  Height: 5\' 6"  (1.676 m)   Body mass index is 28.89 kg/m.  Advanced Directives 12/27/2020 04/06/2019  Does Patient Have a Medical Advance Directive? Yes Yes  Type of Paramedic of [place];Living will [place]  Does patient want to make changes to medical advance directive? - No - Patient declined  Copy of [place] in Chart? No - copy requested No - copy requested    Current Medications (verified) Outpatient Encounter Medications as of 12/27/2020  Medication Sig   apixaban (ELIQUIS) 5 MG TABS tablet Take 1 tablet (5 mg total) by mouth 2 (two) times daily.   cetirizine (ZYRTEC) 10 MG tablet TAKE 1 TABLET BY MOUTH EVERY DAY   chlorthalidone (HYGROTON) 25 MG tablet TAKE 1 TABLET BY MOUTH EVERY DAY   Cholecalciferol (VITAMIN D3 PO) Take 1 tablet by mouth daily.   cloNIDine (CATAPRES) 0.3 MG tablet TAKE 1 TABLET BY MOUTH TWICE A DAY   CVS CHEWABLE C WITH ROSE HIPS 500 MG CHEW TAKE 1 TABLET BY MOUTH EVERY DAY   diclofenac  Sodium (VOLTAREN) 1 % GEL Apply four times a day to hip.   diphenoxylate-atropine (LOMOTIL) 2.5-0.025 MG tablet Take 1 tablet by mouth 4 (four) times daily as needed for diarrhea or loose stools.   fluticasone (FLONASE) 50 MCG/ACT nasal spray USE 2 SPRAYS IN EACH NOSTRIL DAILY   folic acid (FOLVITE) 1 MG tablet TAKE 1 TABLET BY MOUTH EVERY DAY   levothyroxine (SYNTHROID) 50 MCG tablet TAKE 1 TABLET BY MOUTH EVERY DAY   metoprolol tartrate (LOPRESSOR) 50 MG tablet Take 1 tablet (50 mg total) by mouth 2 (two) times daily.   Multiple Vitamins-Minerals (ZINC PO) Take 1 tablet by mouth daily.   potassium chloride (MICRO-K) 10 MEQ CR capsule TAKE 4 CAPSULES BY MOUTH EVERY DAY   Zinc Sulfate (ZINC-220 PO) Take by mouth.   No facility-administered encounter medications on file as of 12/27/2020.    Allergies (verified) Patient has no known allergies.   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[facility])    Past Surgical History:  Procedure Laterality Date   CATARACT EXTRACTION Right    COLON RESECTION  2008   ischemic bowel   Family History  Problem Relation Age of Onset   Parkinson's disease Father    Cirrhosis Brother  from agent orange   Hypertension Other    Diabetes Other    Social History   Socioeconomic History   Marital status: Married    Spouse name: Not on file   Number of children: 2   Years of education: Not on file   Highest education level: Not on file  Occupational History   Not on file  Tobacco Use   Smoking status: Former   Smokeless tobacco: Never  Vaping Use   Vaping Use: Never used  Substance and Sexual Activity   Alcohol use: Not Currently   Drug use: Never   Sexual activity: Not Currently    Birth control/protection: None  Other Topics Concern   Not on file   Social History Narrative   Not on file   Social Determinants of Health   Financial Resource Strain: Low Risk    Difficulty of Paying Living Expenses: Not hard at all  Food Insecurity: No Food Insecurity   Worried About Charity fundraiser in the Last Year: Never true   [place] in the Last Year: Never true  Transportation Needs: No Transportation Needs   Lack of Transportation (Medical): No   Lack of Transportation (Non-Medical): No  Physical Activity: Inactive   Days of Exercise per Week: 0 days   Minutes of Exercise per Session: 0 min  Stress: No Stress Concern Present   Feeling of Stress : Not at all  Social Connections: Socially Integrated   Frequency of Communication with Friends and Family: More than three times a week   Frequency of Social Gatherings with Friends and Family: Three times a week   Attends Religious Services: More than 4 times per year   Active Member of Clubs or Organizations: Yes   Attends Archivist Meetings: Never   Marital Status: Married    Tobacco Counseling Counseling given: Not Answered   Clinical Intake:  Pre-visit preparation completed: Yes  Pain : No/denies pain     BMI - recorded: 28.89 Nutritional Status: BMI 25 -29 Overweight Nutritional Risks: None Diabetes: No  How often do you need to have someone help you when you read instructions, pamphlets, or other written materials from your doctor or pharmacy?: 1 - Never What is the last grade level you completed in school?: High School diploma  Diabetic?No.  Interpreter Needed?: No      Activities of Daily Living In your present state of health, do you have any difficulty performing the following activities: 12/27/2020 06/27/2020  Hearing? N N  Vision? N N  Difficulty concentrating or making decisions? N N  Walking or climbing stairs? N N  Dressing or bathing? N N  Doing errands, shopping? N N  Preparing Food and eating ? N -  Using the Toilet? N -  In the  past six months, have you accidently leaked urine? N -  Do you have problems with loss of bowel control? N -  Managing your Medications? N -  Managing your Finances? N -  Housekeeping or managing your Housekeeping? N -  Some recent data might be hidden    Patient Care Team: Rochel Brome, MD as PCP - General (Family Medicine) Burnice Logan, [facility] (Inactive) as Pharmacist (Pharmacist) Marcy Panning, MD as Referring Physician (Oncology)  Indicate any recent Medical Services you may have received from other than [facility] providers in the past year (date may be approximate).     Assessment:   This is a routine wellness examination for [place].  Hearing/Vision screen No results found.  Dietary issues and exercise activities discussed: Current Exercise Habits: The patient does not participate in regular exercise at present, Exercise limited by: orthopedic condition(s)   Goals Addressed   None   Depression Screen PHQ 2/9 Scores 12/27/2020 12/25/2020 09/19/2019  PHQ - 2 Score 0 0 0    Fall Risk Fall Risk  12/27/2020 12/25/2020  Falls in the past year? 0 0  Number falls in past yr: 0 0  Injury with Fall? 0 0  Risk for fall due to : No Fall Risks No Fall Risks  Follow up Education provided Falls evaluation completed    [place]:  Any stairs in or around the home? Yes  If so, are there any without handrails? Yes  Home free of loose throw rugs in walkways, pet beds, electrical cords, etc? No  Adequate lighting in your home to reduce risk of falls? No   ASSISTIVE DEVICES UTILIZED TO PREVENT FALLS:  Life alert? No  Use of a cane, walker or w/c? No  Grab bars in the bathroom? Yes  Shower chair or bench in shower? Yes  Elevated toilet seat or a handicapped toilet? No   TIMED UP AND GO:  Was the test performed?  N/A .  Length of time to ambulate 10 feet: N/A sec.    Cognitive Function:     6CIT Screen 12/27/2020  What Year? 0 points  What month? 0  points  What time? 0 points  Count back from 20 0 points  Months in reverse 0 points  Repeat phrase 2 points  Total Score 2    Immunizations Immunization History  Administered Date(s) Administered   Fluad Quad(high Dose 65+) 12/26/2019, 12/25/2020   Moderna SARS-COV2 Booster Vaccination 08/13/2020   Moderna Sars-Covid-2 Vaccination 05/14/2019, 06/11/2019, 01/23/2020   Pneumococcal Conjugate-13 01/31/2015   Pneumococcal Polysaccharide-23 01/06/2013    TDAP status: Due, Education has been provided regarding the importance of this vaccine. Advised may receive this vaccine at local pharmacy or Health Dept. Aware to provide a copy of the vaccination record if obtained from local pharmacy or Health Dept. Verbalized acceptance and understanding.  Flu Vaccine status: Up to date  Pneumococcal vaccine status: Up to date  Covid-19 vaccine status: Information provided on how to obtain vaccines.  Patient is due for covid 19 booster.  Qualifies for Shingles Vaccine? Yes   Zostavax completed No   Shingrix Completed?: No.    Education has been provided regarding the importance of this vaccine. Patient has been advised to call insurance company to determine out of pocket expense if they have not yet received this vaccine. Advised may also receive vaccine at local pharmacy or Health Dept. Verbalized acceptance and understanding.  Screening Tests Health Maintenance  Topic Date Due   Hepatitis C Screening  Never done   TETANUS/TDAP  Never done   Zoster Vaccines- Shingrix (1 of 2) Never done   COVID-19 Vaccine (5 - Booster for Moderna series) 12/14/2020   INFLUENZA VACCINE  Completed   DEXA SCAN  Completed   HPV VACCINES  Aged Out    Health Maintenance  Health Maintenance Due  Topic Date Due   Hepatitis C Screening  Never done   TETANUS/TDAP  Never done   Zoster Vaccines- Shingrix (1 of 2) Never done   COVID-19 Vaccine (5 - Booster for Moderna series) 12/14/2020    Colorectal cancer  screening: Patient requested Cologuard. She refused colonoscopy.  Mammogram status: Ordered  12/27/2020. Pt provided with contact info and advised to call to schedule appt.   Bone Density status: Ordered 12/27/2020. Pt provided with contact info and advised to call to schedule appt.  Lung Cancer Screening: (Low Dose CT Chest recommended if Age 66-80 years, 30 pack-year currently smoking OR have quit w/in 15years.) does not qualify.   Lung Cancer Screening Referral: N/A  Additional Screening:  Hepatitis C Screening: does qualify; Patient will talk with provider.  Vision Screening: Recommended annual ophthalmology exams for early detection of glaucoma and other disorders of the eye. Is the patient up to date with their annual eye exam?  No. She had an eye exam 2 years ago. She will set up an appointment when she is ready. Who is the provider or what is the name of the office in which the patient attends annual eye exams? Patient did not remember the name of the provider.  If pt is not established with a provider, would they like to be referred to a provider to establish care? No .   Dental Screening: Recommended annual dental exams for proper oral hygiene  Community Resource Referral / Chronic Care Management: CRR required this visit?  No   CCM required this visit?  No      Plan:     I have personally reviewed and noted the following in the patient's chart:   Medical and social history Use of alcohol, tobacco or illicit drugs  Current medications and supplements including opioid prescriptions. Patient is not currently taking opioid prescriptions. Functional ability and status Nutritional status Physical activity Advanced directives List of other physicians Hospitalizations, surgeries, and ER visits in previous 12 months Vitals Screenings to include cognitive, depression, and falls Referrals and appointments  In addition, I have reviewed and discussed with patient certain  preventive protocols, quality metrics, and best practice recommendations. A written personalized care plan for preventive services as well as general preventive health recommendations were provided to patient.     Augustin Coupe, [place]   12/27/2020   Nurse Notes: Non-face to face 35 minutes visit.  Patient is due for mammogram, bone density, shringrix vaccine, colonoscopy, covid 19 booster and Tdap. I put the order for mammogram, bone density and cologuard because she does not want colonoscopy. Patient will come by for covid 19 booster in few weeks. She refused shingrix and tetanus shot at this time. Patient would talk with Dr Tobie Poet regarding to Hepatitis C screening.

## 2020-12-28 ENCOUNTER — Other Ambulatory Visit: Payer: Self-pay

## 2020-12-28 ENCOUNTER — Other Ambulatory Visit: Payer: Self-pay | Admitting: Family Medicine

## 2020-12-28 MED ORDER — LEVOTHYROXINE SODIUM 75 MCG PO TABS: 75.0000 ug | ORAL_TABLET | Freq: Every day | ORAL | 1 refills | Status: DC

## 2020-12-29 ENCOUNTER — Encounter: Payer: Self-pay | Admitting: Family Medicine

## 2020-12-29 DIAGNOSIS — Z13820 Encounter for screening for osteoporosis: Secondary | ICD-10-CM | POA: Insufficient documentation

## 2020-12-29 DIAGNOSIS — Z1211 Encounter for screening for malignant neoplasm of colon: Secondary | ICD-10-CM | POA: Insufficient documentation

## 2020-12-29 DIAGNOSIS — Z1231 Encounter for screening mammogram for malignant neoplasm of breast: Secondary | ICD-10-CM | POA: Insufficient documentation

## 2020-12-29 DIAGNOSIS — D6869 Other thrombophilia: Secondary | ICD-10-CM | POA: Insufficient documentation

## 2020-12-29 NOTE — Assessment & Plan Note (Signed)
Well-controlled on Synthroid 50 mcg once daily in AM.

## 2020-12-29 NOTE — Assessment & Plan Note (Addendum)
Order DEXA. Recommend calcium and vitamin D 500 to 600 mg twice daily

## 2020-12-29 NOTE — Assessment & Plan Note (Signed)
Due to being on eliquis.  Low risk of bleeding as pt has coagulation disorder (antiphospholipid syndrome.)

## 2020-12-29 NOTE — Assessment & Plan Note (Signed)
Family history of colon cancer.  Performed for colonoscopy.

## 2020-12-29 NOTE — Assessment & Plan Note (Signed)
Patient is at high risk for clots.  Continue Eliquis 2.5 mg twice daily.

## 2020-12-29 NOTE — Assessment & Plan Note (Signed)
Last blood count had improved.   Continue regular follow-up with Dr. Humphrey Rolls.

## 2020-12-29 NOTE — Assessment & Plan Note (Signed)
Likely secondary to COVID-19 however patient is at high risk for blood clots and strokes due to her antiphospholipid antibody syndrome.  Continue Eliquis 2.5 mg twice daily.

## 2020-12-29 NOTE — Assessment & Plan Note (Signed)
Order mammogram °

## 2020-12-31 ENCOUNTER — Other Ambulatory Visit: Payer: Self-pay | Admitting: Family Medicine

## 2020-12-31 DIAGNOSIS — Z1211 Encounter for screening for malignant neoplasm of colon: Secondary | ICD-10-CM

## 2020-12-31 NOTE — Telephone Encounter (Signed)
Refill sent to pharmacy.   

## 2021-01-09 ENCOUNTER — Telehealth: Payer: Self-pay | Admitting: Family Medicine

## 2021-01-09 NOTE — Telephone Encounter (Signed)
PT IS SCHEDULED FOR SRCEENING MAMMOGRAM AND BONE DENSITY AT [place] ON 03/13/21 CHECKING IN AT 12:10PM

## 2021-01-21 ENCOUNTER — Ambulatory Visit (INDEPENDENT_AMBULATORY_CARE_PROVIDER_SITE_OTHER): Payer: Medicare HMO

## 2021-01-21 ENCOUNTER — Other Ambulatory Visit: Payer: Self-pay

## 2021-01-21 DIAGNOSIS — Z23 Encounter for immunization: Secondary | ICD-10-CM

## 2021-01-29 ENCOUNTER — Other Ambulatory Visit: Payer: Self-pay | Admitting: Family Medicine

## 2021-01-29 ENCOUNTER — Other Ambulatory Visit: Payer: Self-pay

## 2021-01-29 MED ORDER — APIXABAN 5 MG PO TABS: 5.0000 mg | ORAL_TABLET | Freq: Two times a day (BID) | ORAL | 5 refills | Status: DC

## 2021-01-30 ENCOUNTER — Other Ambulatory Visit: Payer: Self-pay | Admitting: Family Medicine

## 2021-01-30 MED ORDER — APIXABAN 5 MG PO TABS: 5.0000 mg | ORAL_TABLET | Freq: Two times a day (BID) | ORAL | 5 refills | Status: DC

## 2021-03-08 ENCOUNTER — Telehealth (INDEPENDENT_AMBULATORY_CARE_PROVIDER_SITE_OTHER): Payer: Medicare HMO | Admitting: Family Medicine

## 2021-03-08 VITALS — Temp 96.8°F | Wt 175.0 lb

## 2021-03-08 DIAGNOSIS — J018 Other acute sinusitis: Secondary | ICD-10-CM | POA: Diagnosis not present

## 2021-03-08 MED ORDER — AMOXICILLIN-POT CLAVULANATE 875-125 MG PO TABS: 1.0000 | ORAL_TABLET | Freq: Two times a day (BID) | ORAL | 0 refills | Status: DC

## 2021-03-08 NOTE — Progress Notes (Signed)
Virtual Visit via Video Note   This visit type was conducted due to national recommendations for restrictions regarding the COVID-19 Pandemic (e.g. social distancing) in an effort to limit this patient's exposure and mitigate transmission in our community.  Due to her co-morbid illnesses, this patient is at least at moderate risk for complications without adequate follow up.  This format is felt to be most appropriate for this patient at this time.  All issues noted in this document were discussed and addressed.  A limited physical exam was performed with this format.  A verbal consent was obtained for the virtual visit.   Patient Location:home Provider Location:office Evaluation Performed: acute visit  Subjective:    Patient ID: Cynthia Gardner, female    DOB: Feb 18, 1946, 75 y.o.   MRN: [id number]  Chief Complaint  Patient presents with   Nasal Congestion   Otalgia    HPI: Patient is in today for earaches, runny nose, nasal congestion, sore throat due to PND, cough, and dull headaches behind eye since Monday. Pt checked home covid 19 test on Monday was negative.   Past Medical History:  Diagnosis Date   Antiphospholipid antibody syndrome (HCC)    CVA (cerebral vascular accident) ([place]) 04/06/2019   History of COVID-19    HTN (hypertension) 04/06/2019   Hypothyroidism 04/06/2019   Leukemia ([place])     Past Surgical History:  Procedure Laterality Date   CATARACT EXTRACTION Right    COLON RESECTION  2008   ischemic bowel    Family History  Problem Relation Age of Onset   Parkinson's disease Father    Cirrhosis Brother        from agent orange   Hypertension Other    Diabetes Other     Social History   Socioeconomic History   Marital status: Married    Spouse name: Not on file   Number of children: 2   Years of education: Not on file   Highest education level: Not on file  Occupational History   Not on file  Tobacco Use   Smoking status: Former   Smokeless tobacco:  Never  Vaping Use   Vaping Use: Never used  Substance and Sexual Activity   Alcohol use: Not Currently   Drug use: Never   Sexual activity: Not Currently    Birth control/protection: None  Other Topics Concern   Not on file  Social History Narrative   Not on file   Social Determinants of Health   Financial Resource Strain: Low Risk    Difficulty of Paying Living Expenses: Not hard at all  Food Insecurity: No Food Insecurity   Worried About Charity fundraiser in the Last Year: Never true   Ran Out of Food in the Last Year: Never true  Transportation Needs: No Transportation Needs   Lack of Transportation (Medical): No   Lack of Transportation (Non-Medical): No  Physical Activity: Inactive   Days of Exercise per Week: 0 days   Minutes of Exercise per Session: 0 min  Stress: No Stress Concern Present   Feeling of Stress : Not at all  Social Connections: Socially Integrated   Frequency of Communication with Friends and Family: More than three times a week   Frequency of Social Gatherings with Friends and Family: Three times a week   Attends Religious Services: More than 4 times per year   Active Member of Clubs or Organizations: Yes   Attends Archivist Meetings: Never  Marital Status: Married  Human resources officer Violence: Not At Risk   Fear of Current or Ex-Partner: No   Emotionally Abused: No   Physically Abused: No   Sexually Abused: No    Outpatient Medications Prior to Visit  Medication Sig Dispense Refill   apixaban (ELIQUIS) 5 MG TABS tablet Take 1 tablet (5 mg total) by mouth 2 (two) times daily. 60 tablet 5   cetirizine (ZYRTEC) 10 MG tablet TAKE 1 TABLET BY MOUTH EVERY DAY 90 tablet 3   chlorthalidone (HYGROTON) 25 MG tablet TAKE 1 TABLET BY MOUTH EVERY DAY 90 tablet 1   Cholecalciferol (VITAMIN D3 PO) Take 1 tablet by mouth daily.     cloNIDine (CATAPRES) 0.3 MG tablet TAKE 1 TABLET BY MOUTH TWICE A DAY 180 tablet 1   CVS CHEWABLE C WITH ROSE HIPS 500  MG CHEW TAKE 1 TABLET BY MOUTH EVERY DAY 100 tablet 1   diclofenac Sodium (VOLTAREN) 1 % GEL Apply four times a day to hip. 1000 g 1   diphenoxylate-atropine (LOMOTIL) 2.5-0.025 MG tablet Take 1 tablet by mouth 4 (four) times daily as needed for diarrhea or loose stools. 60 tablet 2   fluticasone (FLONASE) 50 MCG/ACT nasal spray USE 2 SPRAYS IN EACH NOSTRIL DAILY 48 mL 5   folic acid (FOLVITE) 1 MG tablet TAKE 1 TABLET BY MOUTH EVERY DAY 90 tablet 1   levothyroxine (SYNTHROID) 75 MCG tablet Take 1 tablet (75 mcg total) by mouth daily before breakfast. 90 tablet 1   metoprolol tartrate (LOPRESSOR) 50 MG tablet Take 1 tablet (50 mg total) by mouth 2 (two) times daily. 60 tablet 0   Multiple Vitamins-Minerals (ZINC PO) Take 1 tablet by mouth daily.     potassium chloride (MICRO-K) 10 MEQ CR capsule TAKE 4 CAPSULES BY MOUTH EVERY DAY 360 capsule 1   Zinc Sulfate (ZINC-220 PO) Take by mouth.     No facility-administered medications prior to visit.    No Known Allergies  Review of Systems  Constitutional:  Negative for fatigue and fever.  HENT:  Positive for ear pain and sore throat.   Respiratory:  Positive for cough.   Gastrointestinal:  Negative for constipation, diarrhea and nausea.  Musculoskeletal:  Negative for myalgias.  Neurological:  Positive for headaches.      Objective:    Physical Exam Vitals reviewed.  Constitutional:      Appearance: Normal appearance.     Comments: Sounds congested.   Neurological:     Mental Status: She is alert.    Temp (!) 96.8 F (36 C)   Wt 175 lb (79.4 kg)   BMI 28.25 kg/m  Wt Readings from Last 3 Encounters:  03/08/21 175 lb (79.4 kg)  12/27/20 179 lb (81.2 kg)  12/25/20 179 lb (81.2 kg)    Health Maintenance Due  Topic Date Due   Hepatitis C Screening  Never done   TETANUS/TDAP  Never done   Zoster Vaccines- Shingrix (1 of 2) Never done    There are no preventive care reminders to display for this patient.   Lab Results   Component Value Date   TSH 5.960 (H) 12/25/2020   Lab Results  Component Value Date   WBC 35.2 (HH) 05/07/2020   HGB 12.7 05/07/2020   HCT 37.8 05/07/2020   MCV 92 05/07/2020   PLT 299 05/07/2020   Lab Results  Component Value Date   NA 141 05/07/2020   K 4.7 05/07/2020   CO2 20 05/07/2020  GLUCOSE 105 (H) 05/07/2020   BUN 24 05/07/2020   CREATININE 1.34 (H) 05/07/2020   BILITOT 0.8 05/07/2020   ALKPHOS 112 05/07/2020   AST 21 05/07/2020   ALT 14 05/07/2020   PROT 6.7 05/07/2020   ALBUMIN 4.2 05/07/2020   CALCIUM 9.1 05/07/2020   ANIONGAP 10 04/18/2019   Lab Results  Component Value Date   CHOL 152 12/25/2020   Lab Results  Component Value Date   HDL 41 12/25/2020   Lab Results  Component Value Date   LDLCALC 91 12/25/2020   Lab Results  Component Value Date   TRIG 110 12/25/2020   Lab Results  Component Value Date   CHOLHDL 3.7 12/25/2020   Lab Results  Component Value Date   HGBA1C 5.7 (H) 12/25/2020       Assessment & Plan:   Problem List Items Addressed This Visit       Respiratory   Acute infection of nasal sinus - Primary    Rx: augmentin.  Rest, fluids.       Relevant Medications   amoxicillin-clavulanate (AUGMENTIN) 875-125 MG tablet   Meds ordered this encounter  Medications   amoxicillin-clavulanate (AUGMENTIN) 875-125 MG tablet    Sig: Take 1 tablet by mouth 2 (two) times daily.    Dispense:  20 tablet    Refill:  0   Time:  Today, I have spent 8 minutes with the patient with telehealth technology discussing the above problems.    Follow Up:  Virtual Visit  prn  An After Visit Summary was printed and given to the patient.  Rochel Brome, MD [facility] [phone]

## 2021-03-13 ENCOUNTER — Encounter: Payer: Self-pay | Admitting: Family Medicine

## 2021-03-13 DIAGNOSIS — J019 Acute sinusitis, unspecified: Secondary | ICD-10-CM | POA: Insufficient documentation

## 2021-03-13 NOTE — Assessment & Plan Note (Signed)
Rx: augmentin.  Rest, fluids.

## 2021-03-19 ENCOUNTER — Telehealth: Payer: Self-pay

## 2021-03-19 NOTE — Telephone Encounter (Signed)
Patient in [facility], called and asked if she could get samples. I told her I was not in the office and to call the front desk to see if she could get any.

## 2021-03-28 NOTE — Telephone Encounter (Signed)
PT NO SHOWED MAMMOGRAM AND BONE DENSITY

## 2021-04-02 ENCOUNTER — Telehealth: Payer: Self-pay

## 2021-04-02 NOTE — Telephone Encounter (Signed)
Patient called questioning if you would be willing to change her thyroid medication to [place] thyroid states her current medication is making her "hair horrible"

## 2021-04-03 NOTE — Telephone Encounter (Signed)
Spoke with patient made her aware of information, verbalized understanding. Patient stated she would just wait till the end of the month at her appointment to get tsh check and talk to you about changing her medication.

## 2021-04-28 NOTE — Progress Notes (Addendum)
Subjective:  Patient ID: Cynthia Gardner, female    DOB: [birth year]  Age: 76 y.o. MRN: [id number]  Chief Complaint  Patient presents with   Hyperlipidemia   Hypertension   Hypothyroidism    HPI Prediabetes: Patient is eating healthy and low carbs diet. Last HbA1C 5.7%.  Hypertension: Currently taking Chlorthalidone 25 mg daily, clonidine 0.3 mg twice a day, metoprolol 50 mg twice a day.  Hyperlipidemia: She tries to eat healthy and low fat diet.  Hypothyroidism: She takes levothyroxine 75 mg daily. Increased appetite.   CLL: Goes annually.   Paroxysmal atrial fibrillation/antiphospholipid syndrome: on eliquis  Poor hearing. Left ear has wax. Previously cleaned, but reaccumulated.   Current Outpatient Medications on File Prior to Visit  Medication Sig Dispense Refill   apixaban (ELIQUIS) 5 MG TABS tablet Take 1 tablet (5 mg total) by mouth 2 (two) times daily. 60 tablet 5   cetirizine (ZYRTEC) 10 MG tablet TAKE 1 TABLET BY MOUTH EVERY DAY 90 tablet 3   chlorthalidone (HYGROTON) 25 MG tablet TAKE 1 TABLET BY MOUTH EVERY DAY 90 tablet 1   Cholecalciferol (VITAMIN D3 PO) Take 1 tablet by mouth daily.     cloNIDine (CATAPRES) 0.3 MG tablet TAKE 1 TABLET BY MOUTH TWICE A DAY 180 tablet 1   [facility]S CHEWABLE C WITH ROSE HIPS 500 MG CHEW TAKE 1 TABLET BY MOUTH EVERY DAY 100 tablet 1   diclofenac Sodium (VOLTAREN) 1 % GEL Apply four times a day to hip. 1000 g 1   diphenoxylate-atropine (LOMOTIL) 2.5-0.025 MG tablet Take 1 tablet by mouth 4 (four) times daily as needed for diarrhea or loose stools. 60 tablet 2   fluticasone (FLONASE) 50 MCG/ACT nasal spray USE 2 SPRAYS IN EACH NOSTRIL DAILY 48 mL 5   folic acid (FOLVITE) 1 MG tablet TAKE 1 TABLET BY MOUTH EVERY DAY 90 tablet 1   levothyroxine (SYNTHROID) 75 MCG tablet Take 1 tablet (75 mcg total) by mouth daily before breakfast. 90 tablet 1   metoprolol tartrate (LOPRESSOR) 50 MG tablet Take 1 tablet (50 mg total) by mouth 2 (two) times daily.  60 tablet 0   Multiple Vitamins-Minerals (ZINC PO) Take 1 tablet by mouth daily.     potassium chloride (MICRO-K) 10 MEQ CR capsule TAKE 4 CAPSULES BY MOUTH EVERY DAY 360 capsule 1   Zinc Sulfate (ZINC-220 PO) Take by mouth.     No current facility-administered medications on file prior to visit.   Past Medical History:  Diagnosis Date   Antiphospholipid antibody syndrome (HCC)    CVA (cerebral vascular accident) ([place]) 04/06/2019   History of COVID-19    HTN (hypertension) 04/06/2019   Hypothyroidism 04/06/2019   Leukemia ([place])    Past Surgical History:  Procedure Laterality Date   CATARACT EXTRACTION Right    COLON RESECTION  2008   ischemic bowel    Family History  Problem Relation Age of Onset   Parkinson's disease Father    Cirrhosis Brother        from agent orange   Hypertension Other    Diabetes Other    Social History   Socioeconomic History   Marital status: Married    Spouse name: Not on file   Number of children: 2   Years of education: Not on file   Highest education level: Not on file  Occupational History   Not on file  Tobacco Use   Smoking status: Former   Smokeless tobacco: Never  Vaping Use  Vaping Use: Never used  Substance and Sexual Activity   Alcohol use: Not Currently   Drug use: Never   Sexual activity: Not Currently    Birth control/protection: None  Other Topics Concern   Not on file  Social History Narrative   Not on file   Social Determinants of Health   Financial Resource Strain: Low Risk    Difficulty of Paying Living Expenses: Not hard at all  Food Insecurity: No Food Insecurity   Worried About Charity fundraiser in the Last Year: Never true   [place] in the Last Year: Never true  Transportation Needs: No Transportation Needs   Lack of Transportation (Medical): No   Lack of Transportation (Non-Medical): No  Physical Activity: Inactive   Days of Exercise per Week: 0 days   Minutes of Exercise per Session: 0 min   Stress: No Stress Concern Present   Feeling of Stress : Not at all  Social Connections: Socially Integrated   Frequency of Communication with Friends and Family: More than three times a week   Frequency of Social Gatherings with Friends and Family: Three times a week   Attends Religious Services: More than 4 times per year   Active Member of Clubs or Organizations: Yes   Attends Archivist Meetings: Never   Marital Status: Married    Review of Systems  Constitutional:  Negative for chills, fatigue and fever.  HENT:  Negative for congestion, rhinorrhea and sore throat.        Hearing loss  Respiratory:  Negative for cough and shortness of breath.   Cardiovascular:  Negative for chest pain.  Gastrointestinal:  Negative for abdominal pain, constipation, diarrhea, nausea and vomiting.  Genitourinary:  Negative for dysuria and urgency.  Musculoskeletal:  Positive for arthralgias (knee pain) and back pain. Negative for myalgias.  Neurological:  Negative for dizziness, weakness, light-headedness and headaches.  Psychiatric/Behavioral:  Negative for dysphoric mood. The patient is not nervous/anxious.     Objective:  BP 120/72    Pulse 64    Temp 98.8 F (37.1 C)    Resp 16    Ht 5\' 6"  (1.676 m)    Wt 177 lb (80.3 kg)    BMI 28.57 kg/m   BP/Weight 04/29/2021 03/08/2021 9/52/8413  Systolic BP 244 - -  Diastolic BP 72 - -  Wt. (Lbs) 177 175 179  BMI 28.57 28.25 28.89    Physical Exam Vitals reviewed.  Constitutional:      Appearance: Normal appearance. She is normal weight.  HENT:     Right Ear: Tympanic membrane normal. There is no impacted cerumen.     Left Ear: There is impacted cerumen (partial).  Neck:     Vascular: No carotid bruit.  Cardiovascular:     Rate and Rhythm: Normal rate and regular rhythm.     Heart sounds: Normal heart sounds.  Pulmonary:     Effort: Pulmonary effort is normal. No respiratory distress.     Breath sounds: Normal breath sounds.   Abdominal:     General: Abdomen is flat. Bowel sounds are normal.     Palpations: Abdomen is soft.     Tenderness: There is no abdominal tenderness.  Neurological:     Mental Status: She is alert and oriented to person, place, and time.  Psychiatric:        Mood and Affect: Mood normal.        Behavior: Behavior normal.    Diabetic  Foot Exam - Simple   No data filed      Lab Results  Component Value Date   WBC 35.2 (HH) 05/07/2020   HGB 12.7 05/07/2020   HCT 37.8 05/07/2020   PLT 299 05/07/2020   GLUCOSE 105 (H) 05/07/2020   CHOL 152 12/25/2020   TRIG 110 12/25/2020   HDL 41 12/25/2020   LDLCALC 91 12/25/2020   ALT 14 05/07/2020   AST 21 05/07/2020   NA 141 05/07/2020   K 4.7 05/07/2020   CL 108 (H) 05/07/2020   CREATININE 1.34 (H) 05/07/2020   BUN 24 05/07/2020   CO2 20 05/07/2020   TSH 5.960 (H) 12/25/2020   HGBA1C 5.7 (H) 12/25/2020      Assessment & Plan:   Problem List Items Addressed This Visit       Cardiovascular and Mediastinum   HTN (hypertension) (Chronic)    Well controlled.  No changes to medicines.  Continue to work on eating a healthy diet and exercise.  Labs drawn today.        Relevant Orders   Comprehensive metabolic panel   CBC with Differential/Platelet   Paroxysmal atrial fibrillation (HCC)    Continue eliquis        Endocrine   Hypothyroidism (Chronic)    Check tsh      Relevant Orders   TSH     Nervous and Auditory   Hearing loss due to cerumen impaction, left    Left irrigated successfully by Meredith Mody, RN.         Hematopoietic and Hemostatic   Antiphospholipid syndrome ([place])    On eliquis      Acquired thrombophilia ([place])    Unchanged. The current medical regimen is effective;  continue present plan and medications.         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       Mixed hyperlipidemia    Well controlled.  No changes to  medicines.  Continue to work on eating a healthy diet and exercise.  Labs drawn today.        Relevant Orders   Lipid panel   Prediabetes - Primary    Recommend continue to work on eating healthy diet and exercise.       Relevant Orders   Hemoglobin A1c   RESOLVED: Anticoagulant long-term use   BMI 28.0-28.9,adult    Recommend continue to work on eating healthy diet and exercise.      . Orders Placed This Encounter  Procedures   Comprehensive metabolic panel   Hemoglobin A1c   Lipid panel   TSH   CBC with Differential/Platelet     Follow-up: Return in about 3 months (around 07/28/2021) for chronic fasting.  An After Visit Summary was printed and given to the patient.  Rochel Brome, MD [facility] [phone]

## 2021-04-29 ENCOUNTER — Other Ambulatory Visit: Payer: Self-pay

## 2021-04-29 ENCOUNTER — Encounter: Payer: Self-pay | Admitting: Family Medicine

## 2021-04-29 ENCOUNTER — Ambulatory Visit (INDEPENDENT_AMBULATORY_CARE_PROVIDER_SITE_OTHER): Payer: Medicare HMO | Admitting: Family Medicine

## 2021-04-29 VITALS — BP 120/72 | HR 64 | Temp 98.8°F | Resp 16 | Ht 66.0 in | Wt 177.0 lb

## 2021-04-29 DIAGNOSIS — H6122 Impacted cerumen, left ear: Secondary | ICD-10-CM | POA: Insufficient documentation

## 2021-04-29 DIAGNOSIS — E038 Other specified hypothyroidism: Secondary | ICD-10-CM

## 2021-04-29 DIAGNOSIS — Z6828 Body mass index (BMI) 28.0-28.9, adult: Secondary | ICD-10-CM | POA: Diagnosis not present

## 2021-04-29 DIAGNOSIS — R7303 Prediabetes: Secondary | ICD-10-CM | POA: Diagnosis not present

## 2021-04-29 DIAGNOSIS — Z7901 Long term (current) use of anticoagulants: Secondary | ICD-10-CM | POA: Diagnosis not present

## 2021-04-29 DIAGNOSIS — D6861 Antiphospholipid syndrome: Secondary | ICD-10-CM

## 2021-04-29 DIAGNOSIS — I480 Paroxysmal atrial fibrillation: Secondary | ICD-10-CM | POA: Diagnosis not present

## 2021-04-29 DIAGNOSIS — C9110 Chronic lymphocytic leukemia of B-cell type not having achieved remission: Secondary | ICD-10-CM | POA: Diagnosis not present

## 2021-04-29 DIAGNOSIS — D6869 Other thrombophilia: Secondary | ICD-10-CM

## 2021-04-29 DIAGNOSIS — E782 Mixed hyperlipidemia: Secondary | ICD-10-CM

## 2021-04-29 DIAGNOSIS — E663 Overweight: Secondary | ICD-10-CM | POA: Insufficient documentation

## 2021-04-29 DIAGNOSIS — I10 Essential (primary) hypertension: Secondary | ICD-10-CM | POA: Diagnosis not present

## 2021-04-29 NOTE — Assessment & Plan Note (Signed)
On eliquis

## 2021-04-29 NOTE — Assessment & Plan Note (Signed)
Check tsh 

## 2021-04-29 NOTE — Assessment & Plan Note (Signed)
Unchanged. The current medical regimen is effective;  continue present plan and medications.

## 2021-04-29 NOTE — Assessment & Plan Note (Signed)
Follow up annually with oncology.  

## 2021-04-29 NOTE — Assessment & Plan Note (Signed)
Recommend continue to work on eating healthy diet and exercise.  

## 2021-04-29 NOTE — Assessment & Plan Note (Signed)
Well controlled.  ?No changes to medicines.  ?Continue to work on eating a healthy diet and exercise.  ?Labs drawn today.  ?

## 2021-04-29 NOTE — Assessment & Plan Note (Signed)
Continue eliquis  ?

## 2021-04-29 NOTE — Assessment & Plan Note (Signed)
Left irrigated successfully by Meredith Mody, RN.

## 2021-04-30 LAB — COMPREHENSIVE METABOLIC PANEL WITH GFR
ALT: 12 [IU]/L (ref 0–32)
AST: 15 [IU]/L (ref 0–40)
Albumin/Globulin Ratio: 1.7 (ref 1.2–2.2)
Albumin: 4.1 g/dL (ref 3.7–4.7)
Alkaline Phosphatase: 103 [IU]/L (ref 44–121)
BUN/Creatinine Ratio: 17 (ref 12–28)
BUN: 25 mg/dL (ref 8–27)
Bilirubin Total: 0.7 mg/dL (ref 0.0–1.2)
CO2: 21 mmol/L (ref 20–29)
Calcium: 8.9 mg/dL (ref 8.7–10.3)
Chloride: 107 mmol/L — ABNORMAL HIGH (ref 96–106)
Creatinine, Ser: 1.5 mg/dL — ABNORMAL HIGH (ref 0.57–1.00)
Globulin, Total: 2.4 g/dL (ref 1.5–4.5)
Glucose: 104 mg/dL — ABNORMAL HIGH (ref 70–99)
Potassium: 4.4 mmol/L (ref 3.5–5.2)
Sodium: 140 mmol/L (ref 134–144)
Total Protein: 6.5 g/dL (ref 6.0–8.5)
eGFR: 36 mL/min/{1.73_m2} — ABNORMAL LOW

## 2021-04-30 LAB — LIPID PANEL
Chol/HDL Ratio: 4 ratio (ref 0.0–4.4)
Cholesterol, Total: 160 mg/dL (ref 100–199)
HDL: 40 mg/dL
LDL Chol Calc (NIH): 95 mg/dL (ref 0–99)
Triglycerides: 143 mg/dL (ref 0–149)
VLDL Cholesterol Cal: 25 mg/dL (ref 5–40)

## 2021-04-30 LAB — CBC WITH DIFFERENTIAL/PLATELET
Basophils Absolute: 0.1 10*3/uL (ref 0.0–0.2)
Basos: 0 %
EOS (ABSOLUTE): 0.5 10*3/uL — ABNORMAL HIGH (ref 0.0–0.4)
Eos: 2 %
Hematocrit: 34.9 % (ref 34.0–46.6)
Hemoglobin: 11.7 g/dL (ref 11.1–15.9)
Immature Grans (Abs): 0 10*3/uL (ref 0.0–0.1)
Immature Granulocytes: 0 %
Lymphocytes Absolute: 29.4 10*3/uL — ABNORMAL HIGH (ref 0.7–3.1)
Lymphs: 80 %
MCH: 30.5 pg (ref 26.6–33.0)
MCHC: 33.5 g/dL (ref 31.5–35.7)
MCV: 91 fL (ref 79–97)
Monocytes Absolute: 0.9 10*3/uL (ref 0.1–0.9)
Monocytes: 3 %
Neutrophils Absolute: 5.4 10*3/uL (ref 1.4–7.0)
Neutrophils: 15 %
Platelets: 263 10*3/uL (ref 150–450)
RBC: 3.84 x10E6/uL (ref 3.77–5.28)
RDW: 12.9 % (ref 11.7–15.4)
WBC: 36.4 10*3/uL (ref 3.4–10.8)

## 2021-04-30 LAB — TSH: TSH: 2.81 u[IU]/mL (ref 0.450–4.500)

## 2021-04-30 LAB — HEMOGLOBIN A1C
Est. average glucose Bld gHb Est-mCnc: 114 mg/dL
Hgb A1c MFr Bld: 5.6 % (ref 4.8–5.6)

## 2021-04-30 LAB — CARDIOVASCULAR RISK ASSESSMENT

## 2021-04-30 NOTE — Progress Notes (Signed)
Mr. [place] labs are pending. You may want to wait for his labs to call.  Blood count abnormal. Secondary to known leukemia. She sees oncology Liver function normal.  Kidney function abnormal. Worsened. Drink more fluids. Recheck CMP in 1-2 weeks.  Thyroid function normal.  Cholesterol: good HBA1C: 5.6. prediabetes is well controlled.

## 2021-05-01 ENCOUNTER — Other Ambulatory Visit: Payer: Self-pay

## 2021-05-01 DIAGNOSIS — N289 Disorder of kidney and ureter, unspecified: Secondary | ICD-10-CM

## 2021-05-07 ENCOUNTER — Other Ambulatory Visit: Payer: Medicare HMO

## 2021-05-07 ENCOUNTER — Other Ambulatory Visit: Payer: Self-pay | Admitting: Family Medicine

## 2021-05-07 DIAGNOSIS — I10 Essential (primary) hypertension: Secondary | ICD-10-CM | POA: Diagnosis not present

## 2021-05-07 DIAGNOSIS — N289 Disorder of kidney and ureter, unspecified: Secondary | ICD-10-CM

## 2021-05-07 LAB — COMPREHENSIVE METABOLIC PANEL WITH GFR
ALT: 9 [IU]/L (ref 0–32)
AST: 13 [IU]/L (ref 0–40)
Albumin/Globulin Ratio: 2 (ref 1.2–2.2)
Albumin: 4.1 g/dL (ref 3.7–4.7)
Alkaline Phosphatase: 100 [IU]/L (ref 44–121)
BUN/Creatinine Ratio: 17 (ref 12–28)
BUN: 26 mg/dL (ref 8–27)
Bilirubin Total: 0.8 mg/dL (ref 0.0–1.2)
CO2: 21 mmol/L (ref 20–29)
Calcium: 9.1 mg/dL (ref 8.7–10.3)
Chloride: 104 mmol/L (ref 96–106)
Creatinine, Ser: 1.55 mg/dL — ABNORMAL HIGH (ref 0.57–1.00)
Globulin, Total: 2.1 g/dL (ref 1.5–4.5)
Glucose: 90 mg/dL (ref 70–99)
Potassium: 4.8 mmol/L (ref 3.5–5.2)
Sodium: 139 mmol/L (ref 134–144)
Total Protein: 6.2 g/dL (ref 6.0–8.5)
eGFR: 35 mL/min/{1.73_m2} — ABNORMAL LOW

## 2021-05-09 ENCOUNTER — Other Ambulatory Visit: Payer: Self-pay | Admitting: Physician Assistant

## 2021-05-09 ENCOUNTER — Other Ambulatory Visit: Payer: Self-pay | Admitting: Family Medicine

## 2021-05-14 ENCOUNTER — Other Ambulatory Visit: Payer: Self-pay

## 2021-05-14 ENCOUNTER — Telehealth (HOSPITAL_BASED_OUTPATIENT_CLINIC_OR_DEPARTMENT_OTHER): Payer: Self-pay

## 2021-05-14 DIAGNOSIS — N289 Disorder of kidney and ureter, unspecified: Secondary | ICD-10-CM

## 2021-05-15 NOTE — Progress Notes (Signed)
Reviewed. Dr. Marlia Schewe  

## 2021-05-20 ENCOUNTER — Other Ambulatory Visit: Payer: Self-pay

## 2021-05-20 ENCOUNTER — Ambulatory Visit (HOSPITAL_BASED_OUTPATIENT_CLINIC_OR_DEPARTMENT_OTHER)
Admission: RE | Admit: 2021-05-20 | Discharge: 2021-05-20 | Disposition: A | Discharge status: Home / Self Care | Payer: Medicare HMO | Source: Ambulatory Visit | Attending: Family Medicine | Admitting: Family Medicine

## 2021-05-20 DIAGNOSIS — N3289 Other specified disorders of bladder: Secondary | ICD-10-CM | POA: Diagnosis not present

## 2021-05-20 DIAGNOSIS — N200 Calculus of kidney: Secondary | ICD-10-CM | POA: Diagnosis not present

## 2021-05-20 DIAGNOSIS — N281 Cyst of kidney, acquired: Secondary | ICD-10-CM | POA: Insufficient documentation

## 2021-05-20 DIAGNOSIS — N289 Disorder of kidney and ureter, unspecified: Secondary | ICD-10-CM | POA: Insufficient documentation

## 2021-06-13 ENCOUNTER — Other Ambulatory Visit: Payer: Self-pay | Admitting: Family Medicine

## 2021-06-13 NOTE — Telephone Encounter (Signed)
Refill sent to pharmacy.   

## 2021-08-02 NOTE — Progress Notes (Signed)
Established Patient Office Visit  Subjective   Patient ID: Cynthia Gardner, female    DOB: 1945/10/31  Age: 76 y.o. MRN: [id number]  Chief Complaint  Patient presents with   Hypertension   Hyperlipidemia   Hypothyroidism    HPI Prediabetes: Patient is eating healthy and low carbs diet. Walking for exercise. Last HbA1C 5.6%. Hypertension and CKD: Currently taking Chlorthalidone 25 mg daily, clonidine 0.3 mg twice a day, metoprolol 50 mg twice a day.  Hyperlipidemia: She tries to eat healthy and low fat diet. Intolerant to statins.   Hypothyroidism: She takes levothyroxine 75 mCg daily.  CLL: Annually. Dr. Humphrey Rolls. Appt in August.   Atrial fibrillation was post covid 19. Has had no further palpitations or recurrences of atrial fibrillation.  He is continued on Eliquis due to her antiphospholipid antibody syndrome. Antiphospholipid syndrome: on eliquis 5 mg twice daily.  Patient is unsure of diagnosis of antiphospholipid. I will research   ABOVE REVIEWED AND UPDATED.   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/05/2021   Overweight with body mass index (BMI) of 28 to 28.9 in adult 04/29/2021   Hearing loss due to cerumen impaction, left 04/29/2021   Acquired thrombophilia ([place]) 12/29/2020   Mixed hyperlipidemia 09/19/2019   Prediabetes 09/19/2019   HTN (hypertension) 04/06/2019   Hypothyroidism 04/06/2019   CLL (chronic lymphocytic leukemia) ([place]) 04/06/2019   Paroxysmal atrial fibrillation ([place]) 04/06/2019   Antiphospholipid syndrome ([place]) 03/05/2017   Past Medical History:  Diagnosis Date   Antiphospholipid antibody syndrome (HCC)    CVA (cerebral vascular accident) ([place]) 04/06/2019   History of COVID-19    HTN (hypertension) 04/06/2019   Hypothyroidism 04/06/2019   Leukemia ([place])    Past Surgical History:  Procedure Laterality Date   CATARACT EXTRACTION  Right    COLON RESECTION  2008   ischemic bowel   Social History   Tobacco Use   Smoking status: Former   Smokeless tobacco: Never  Vaping Use   Vaping Use: Never used  Substance Use Topics   Alcohol use: Not Currently   Drug use: Never   Family Status  Relation Name Status   Father  (Not Specified)   Sister  Deceased       Covid-19  30-Mar-2020   Brother  Deceased       complications from covid 09/2018   Other  (Not Specified)   Family History  Problem Relation Age of Onset   Parkinson's disease Father    Cirrhosis Brother        from agent orange   Hypertension Other    Diabetes Other    No Known Allergies    Review of Systems  Constitutional:  Negative for chills and fever.  HENT:  Negative for congestion, ear pain, sinus pain and sore throat.   Respiratory:  Negative for cough and shortness of breath.   Cardiovascular:  Negative for chest pain.  Neurological:  Positive for weakness. Negative for dizziness and headaches.  Psychiatric/Behavioral:  Negative for depression. The patient is not nervous/anxious.      Objective:     BP 114/72   Pulse 60   Temp (!) 96.4 F (35.8 C)   Resp 14   Ht $R'5\' 6"'yH$  (1.676 m)   Wt 176 lb (79.8 kg)   BMI 28.41 kg/m  BP Readings from Last 3 Encounters:  08/05/21  114/72  04/29/21 120/72  12/25/20 132/76   Wt Readings from Last 3 Encounters:  08/05/21 176 lb (79.8 kg)  04/29/21 177 lb (80.3 kg)  03/08/21 175 lb (79.4 kg)      Physical Exam Vitals reviewed.  Constitutional:      Appearance: Normal appearance. She is obese.  Neck:     Vascular: No carotid bruit.  Cardiovascular:     Rate and Rhythm: Normal rate and regular rhythm.     Heart sounds: Normal heart sounds.  Pulmonary:     Effort: Pulmonary effort is normal. No respiratory distress.     Breath sounds: Normal breath sounds.  Abdominal:     General: Abdomen is flat. Bowel sounds are normal.     Palpations: Abdomen is soft.     Tenderness: There is no  abdominal tenderness.  Musculoskeletal:        General: Deformity (BL KNEEs Valgus deformity.) present. Normal range of motion.     Comments: No leg length discrepancy.   Neurological:     Mental Status: She is alert and oriented to person, place, and time.  Psychiatric:        Mood and Affect: Mood normal.        Behavior: Behavior normal.   Results for orders placed or performed in visit on 08/05/21  VITAMIN D 25 Hydroxy (Vit-D Deficiency, Fractures)  Result Value Ref Range   Vit D, 25-Hydroxy 24.7 (L) 30.0 - 100.0 ng/mL  B12 and Folate Panel  Result Value Ref Range   Vitamin B-12 169 (L) 232 - 1,245 pg/mL   Folate >20.0 >3.0 ng/mL  Ferritin  Result Value Ref Range   Ferritin 134 15 - 150 ng/mL    Last CBC Lab Results  Component Value Date   WBC 36.4 (HH) 04/29/2021   HGB 11.7 04/29/2021   HCT 34.9 04/29/2021   MCV 91 04/29/2021   MCH 30.5 04/29/2021   RDW 12.9 04/29/2021   PLT 263 04/02/7251   Last metabolic panel Lab Results  Component Value Date   GLUCOSE 90 05/07/2021   NA 139 05/07/2021   K 4.8 05/07/2021   CL 104 05/07/2021   CO2 21 05/07/2021   BUN 26 05/07/2021   CREATININE 1.55 (H) 05/07/2021   EGFR 35 (L) 05/07/2021   CALCIUM 9.1 05/07/2021   PROT 6.2 05/07/2021   ALBUMIN 4.1 05/07/2021   LABGLOB 2.1 05/07/2021   AGRATIO 2.0 05/07/2021   BILITOT 0.8 05/07/2021   ALKPHOS 100 05/07/2021   AST 13 05/07/2021   ALT 9 05/07/2021   ANIONGAP 10 04/18/2019   Last lipids Lab Results  Component Value Date   CHOL 160 04/29/2021   HDL 40 04/29/2021   LDLCALC 95 04/29/2021   TRIG 143 04/29/2021   CHOLHDL 4.0 04/29/2021   Last hemoglobin A1c Lab Results  Component Value Date   HGBA1C 5.6 04/29/2021   Last thyroid functions Lab Results  Component Value Date   TSH 2.810 04/29/2021   Last vitamin D Lab Results  Component Value Date   VD25OH 24.7 (L) 08/05/2021   Last vitamin B12 and Folate Lab Results  Component Value Date   VITAMINB12 169  (L) 08/05/2021   FOLATE >20.0 08/05/2021      Assessment & Plan:   Problem List Items Addressed This Visit       Cardiovascular and Mediastinum   HTN (hypertension) (Chronic)    At goal. Continue current medications including chlorthalidone 25 mg daily, clonidine 0.3 mg twice daily  and metoprolol tartrate 50 mg twice daily.       Paroxysmal atrial fibrillation (HCC)    Resolved. Secondary to COVID-19.         Endocrine   Hypothyroidism (Chronic)    At goal.  Continue Synthroid 75 mcg daily.         Musculoskeletal and Integument   Acquired deformity of nail of finger    Check labs.       Relevant Orders   VITAMIN D 25 Hydroxy (Vit-D Deficiency, Fractures) (Completed)   B12 and Folate Panel (Completed)   Ferritin (Completed)     Hematopoietic and Hemostatic   Antiphospholipid syndrome (HCC)    Continue Eliquis       Acquired thrombophilia ([place])    Secondary to Eliquis.  This is necessary due to antiphospholipid antibody syndrome.         Other   CLL (chronic lymphocytic leukemia) (HCC) (Chronic)    Follow-up with oncology annually.  Stable.       Mixed hyperlipidemia - Primary    Intolerant to statins.  Is recommend low-fat diet.       Prediabetes    Recommend eat low sugar diet and exercise as able.       Overweight with body mass index (BMI) of 28 to 28.9 in adult    Recommend eat healthy and exercise as much as able.  Patient has significant arthritis in her knees.       Vitamin D insufficiency    Check vitamin D level.       Myalgia due to statin    Intolerant to statins.        Return in about 3 months (around 11/05/2021) for chronic fasting.    Rochel Brome, MD

## 2021-08-05 ENCOUNTER — Ambulatory Visit (INDEPENDENT_AMBULATORY_CARE_PROVIDER_SITE_OTHER): Payer: Medicare HMO | Admitting: Family Medicine

## 2021-08-05 ENCOUNTER — Encounter: Payer: Self-pay | Admitting: Family Medicine

## 2021-08-05 VITALS — BP 114/72 | HR 60 | Temp 96.4°F | Resp 14 | Ht 66.0 in | Wt 176.0 lb

## 2021-08-05 DIAGNOSIS — E559 Vitamin D deficiency, unspecified: Secondary | ICD-10-CM | POA: Insufficient documentation

## 2021-08-05 DIAGNOSIS — D6861 Antiphospholipid syndrome: Secondary | ICD-10-CM | POA: Diagnosis not present

## 2021-08-05 DIAGNOSIS — L608 Other nail disorders: Secondary | ICD-10-CM | POA: Insufficient documentation

## 2021-08-05 DIAGNOSIS — M791 Myalgia, unspecified site: Secondary | ICD-10-CM

## 2021-08-05 DIAGNOSIS — I480 Paroxysmal atrial fibrillation: Secondary | ICD-10-CM | POA: Diagnosis not present

## 2021-08-05 DIAGNOSIS — C9110 Chronic lymphocytic leukemia of B-cell type not having achieved remission: Secondary | ICD-10-CM

## 2021-08-05 DIAGNOSIS — D6869 Other thrombophilia: Secondary | ICD-10-CM | POA: Diagnosis not present

## 2021-08-05 DIAGNOSIS — R7303 Prediabetes: Secondary | ICD-10-CM

## 2021-08-05 DIAGNOSIS — E038 Other specified hypothyroidism: Secondary | ICD-10-CM | POA: Diagnosis not present

## 2021-08-05 DIAGNOSIS — I10 Essential (primary) hypertension: Secondary | ICD-10-CM

## 2021-08-05 DIAGNOSIS — E663 Overweight: Secondary | ICD-10-CM | POA: Diagnosis not present

## 2021-08-05 DIAGNOSIS — E782 Mixed hyperlipidemia: Secondary | ICD-10-CM | POA: Diagnosis not present

## 2021-08-05 DIAGNOSIS — Z6828 Body mass index (BMI) 28.0-28.9, adult: Secondary | ICD-10-CM

## 2021-08-05 DIAGNOSIS — T466X5A Adverse effect of antihyperlipidemic and antiarteriosclerotic drugs, initial encounter: Secondary | ICD-10-CM

## 2021-08-05 NOTE — Progress Notes (Signed)
Duplicate

## 2021-08-06 ENCOUNTER — Other Ambulatory Visit: Payer: Self-pay

## 2021-08-06 LAB — B12 AND FOLATE PANEL
Folate: >20 ng/mL
Vitamin B-12: 169 pg/mL — ABNORMAL LOW (ref 232–1245)

## 2021-08-06 LAB — FERRITIN: Ferritin: 134 ng/mL (ref 15–150)

## 2021-08-06 LAB — VITAMIN D 25 HYDROXY (VIT D DEFICIENCY, FRACTURES): Vit D, 25-Hydroxy: 24.7 ng/mL — ABNORMAL LOW (ref 30.0–100.0)

## 2021-08-06 MED ORDER — ERGOCALCIFEROL 1.25 MG (50000 UT) PO CAPS: 50000.0000 [IU] | ORAL_CAPSULE | ORAL | 0 refills | Status: DC

## 2021-08-18 DIAGNOSIS — M791 Myalgia, unspecified site: Secondary | ICD-10-CM | POA: Insufficient documentation

## 2021-08-18 DIAGNOSIS — T466X5A Adverse effect of antihyperlipidemic and antiarteriosclerotic drugs, initial encounter: Secondary | ICD-10-CM | POA: Insufficient documentation

## 2021-08-18 NOTE — Assessment & Plan Note (Signed)
Check vitamin D level 

## 2021-08-18 NOTE — Assessment & Plan Note (Signed)
-   Continue Eliquis 

## 2021-08-18 NOTE — Assessment & Plan Note (Signed)
Secondary to Eliquis.  This is necessary due to antiphospholipid antibody syndrome.

## 2021-08-18 NOTE — Assessment & Plan Note (Signed)
Intolerant to statins.  Is recommend low-fat diet.

## 2021-08-18 NOTE — Assessment & Plan Note (Signed)
Recommend eat low sugar diet and exercise as able.

## 2021-08-18 NOTE — Assessment & Plan Note (Signed)
Follow-up with oncology annually.  Stable.

## 2021-08-18 NOTE — Assessment & Plan Note (Signed)
Check labs 

## 2021-08-18 NOTE — Assessment & Plan Note (Signed)
Resolved. Secondary to COVID-19.

## 2021-08-18 NOTE — Assessment & Plan Note (Signed)
At goal.  Continue Synthroid 75 mcg daily.

## 2021-08-18 NOTE — Assessment & Plan Note (Signed)
Recommend eat healthy and exercise as much as able.  Patient has significant arthritis in her knees.

## 2021-08-18 NOTE — Assessment & Plan Note (Signed)
Intolerant to statins. 

## 2021-08-18 NOTE — Assessment & Plan Note (Signed)
At goal. Continue current medications including chlorthalidone 25 mg daily, clonidine 0.3 mg twice daily and metoprolol tartrate 50 mg twice daily.

## 2021-08-21 ENCOUNTER — Other Ambulatory Visit: Payer: Self-pay | Admitting: Family Medicine

## 2021-09-06 ENCOUNTER — Telehealth: Payer: Self-pay

## 2021-09-06 NOTE — Progress Notes (Signed)
Chronic Care Management Pharmacy Assistant   Name: Cynthia Gardner  MRN: [id number] DOB: Apr 03, 1945   Reason for Encounter: General Adherence Call    Recent office visits:  08/05/21 Rochel Brome MD. Seen for routine visit. No med changes.   04/29/21 Rochel Brome MD. Seen for routine visit. No med changes.   Recent consult visits:  None  Hospital visit: None  Medications: Outpatient Encounter Medications as of 09/06/2021  Medication Sig   apixaban (ELIQUIS) 5 MG TABS tablet Take 1 tablet (5 mg total) by mouth 2 (two) times daily.   cetirizine (ZYRTEC) 10 MG tablet TAKE 1 TABLET BY MOUTH EVERY DAY   chlorthalidone (HYGROTON) 25 MG tablet TAKE 1 TABLET BY MOUTH EVERY DAY   Cholecalciferol (VITAMIN D3 PO) Take 1 tablet by mouth daily.   cloNIDine (CATAPRES) 0.3 MG tablet TAKE 1 TABLET BY MOUTH TWICE A DAY   CVS CHEWABLE C WITH ROSE HIPS 500 MG CHEW TAKE 1 TABLET BY MOUTH EVERY DAY   Cyanocobalamin 2500 MCG CHEW Chew by mouth.   diclofenac Sodium (VOLTAREN) 1 % GEL Apply four times a day to hip.   diphenoxylate-atropine (LOMOTIL) 2.5-0.025 MG tablet Take 1 tablet by mouth 4 (four) times daily as needed for diarrhea or loose stools.   ergocalciferol (VITAMIN D2) 1.25 MG (50000 UT) capsule Take 1 capsule (50,000 Units total) by mouth once a week.   fluticasone (FLONASE) 50 MCG/ACT nasal spray SPRAY 2 SPRAYS INTO EACH NOSTRIL EVERY DAY   folic acid (FOLVITE) 1 MG tablet TAKE 1 TABLET BY MOUTH EVERY DAY   levothyroxine (SYNTHROID) 75 MCG tablet TAKE 1 TABLET BY MOUTH DAILY BEFORE BREAKFAST.   metoprolol tartrate (LOPRESSOR) 50 MG tablet TAKE 1 TABLET BY MOUTH TWICE A DAY   Multiple Vitamins-Minerals (ZINC PO) Take 1 tablet by mouth daily.   potassium chloride (MICRO-K) 10 MEQ CR capsule TAKE 4 CAPSULES BY MOUTH EVERY DAY   Zinc Sulfate (ZINC-220 PO) Take by mouth.   No facility-administered encounter medications on file as of 09/06/2021.    [place] for General Review  Call   Chart Review:  Have there been any documented new, changed, or discontinued medications since last visit? No  Has there been any documented recent hospitalizations or ED visits since last visit with Clinical Pharmacist? No Brief Summary (including medication and/or Diagnosis changes): None   Adherence Review:  Does the Clinical Pharmacist Assistant have access to adherence rates? Yes Adherence rates for STAR metric medications (List medication(s)/day supply/ last 2 fill dates). None Adherence rates for medications indicated for disease state being reviewed (List medication(s)/day supply/ last 2 fill dates). Does the patient have >5 day gap between last estimated fill dates for any of the above medications or other medication gaps? No Reason for medication gaps.N/A   Disease State Questions:  Able to connect with Patient? Yes Did patient have any problems with their health recently? No Note problems and Concerns: Have you had any admissions or emergency room visits or worsening of your condition(s) since last visit? No Details of ED visit, hospital visit and/or worsening condition(s): Have you had any visits with new specialists or providers since your last visit? No Explain: Have you had any new health care problem(s) since your last visit? No New problem(s) reported: Have you run out of any of your medications since you last spoke with clinical pharmacist? No What caused you to run out of your medications? Are there any medications you are not taking  as prescribed? No What kept you from taking your medications as prescribed? Are you having any issues or side effects with your medications? No Note of issues or side effects: Do you have any other health concerns or questions you want to discuss with your Clinical Pharmacist before your next visit? No Note additional concerns and questions from Patient. Are there any health concerns that you feel we can do a better job  addressing? No Note Patient's response. Are you having any problems with any of the following since the last visit: (select all that apply)  None  Details: 12. Any falls since last visit? No  Details: 13. Any increased or uncontrolled pain since last visit? No  Details: 14. Next visit Type: office       Visit with:Dr. Cox        Date:12/10/21        Time:7:30am  15. Additional Details? Yes, Pt declined to schedule a follow up. She feels she does not need it and feels she is doing great.    Elray Mcgregor, [place] Pharmacist Assistant  [phone]

## 2021-10-03 ENCOUNTER — Other Ambulatory Visit: Payer: Self-pay | Admitting: Physician Assistant

## 2021-10-03 ENCOUNTER — Other Ambulatory Visit: Payer: Self-pay | Admitting: Legal Medicine

## 2021-10-03 ENCOUNTER — Other Ambulatory Visit: Payer: Self-pay | Admitting: Family Medicine

## 2021-10-03 DIAGNOSIS — E038 Other specified hypothyroidism: Secondary | ICD-10-CM

## 2021-11-06 ENCOUNTER — Other Ambulatory Visit: Payer: Self-pay | Admitting: Family Medicine

## 2021-11-08 ENCOUNTER — Telehealth: Payer: Self-pay

## 2021-11-08 NOTE — Progress Notes (Signed)
Chronic Care Management Pharmacy Assistant   Name: CORTLYNN HOLLINSWORTH  MRN: [id number] DOB: 1946/01/08   Reason for Encounter: Disease State call for HTN   Recent office visits:  None  Recent consult visits:  None  Hospital visits:  None  Medications: Outpatient Encounter Medications as of 11/08/2021  Medication Sig   apixaban (ELIQUIS) 5 MG TABS tablet Take 1 tablet (5 mg total) by mouth 2 (two) times daily.   cetirizine (ZYRTEC) 10 MG tablet TAKE 1 TABLET BY MOUTH EVERY DAY   chlorthalidone (HYGROTON) 25 MG tablet TAKE 1 TABLET BY MOUTH EVERY DAY   Cholecalciferol (VITAMIN D3 PO) Take 1 tablet by mouth daily.   cloNIDine (CATAPRES) 0.3 MG tablet TAKE 1 TABLET BY MOUTH TWICE A DAY   [facility] CHEWABLE C WITH ROSE HIPS 500 MG CHEW TAKE 1 TABLET BY MOUTH EVERY DAY   Cyanocobalamin 2500 MCG CHEW Chew by mouth.   diclofenac Sodium (VOLTAREN) 1 % GEL Apply four times a day to hip.   diphenoxylate-atropine (LOMOTIL) 2.5-0.025 MG tablet Take 1 tablet by mouth 4 (four) times daily as needed for diarrhea or loose stools.   fluticasone (FLONASE) 50 MCG/ACT nasal spray SPRAY 2 SPRAYS INTO EACH NOSTRIL EVERY DAY   folic acid (FOLVITE) 1 MG tablet TAKE 1 TABLET BY MOUTH EVERY DAY   levothyroxine (SYNTHROID) 75 MCG tablet TAKE 1 TABLET BY MOUTH EVERY DAY BEFORE BREAKFAST   metoprolol tartrate (LOPRESSOR) 50 MG tablet TAKE 1 TABLET BY MOUTH TWICE A DAY   Multiple Vitamins-Minerals (ZINC PO) Take 1 tablet by mouth daily.   potassium chloride (MICRO-K) 10 MEQ CR capsule TAKE 4 CAPSULES BY MOUTH EVERY DAY   Vitamin D, Ergocalciferol, (DRISDOL) 1.25 MG (50000 UNIT) CAPS capsule TAKE 1 CAPSULE BY MOUTH ONE TIME PER WEEK   Zinc Sulfate (ZINC-220 PO) Take by mouth.   No facility-administered encounter medications on file as of 11/08/2021.     Recent Office Vitals: BP Readings from Last 3 Encounters:  08/05/21 114/72  04/29/21 120/72  12/25/20 132/76   Pulse Readings from Last 3 Encounters:  08/05/21  60  04/29/21 64  12/25/20 (!) 55    Wt Readings from Last 3 Encounters:  08/05/21 176 lb (79.8 kg)  04/29/21 177 lb (80.3 kg)  03/08/21 175 lb (79.4 kg)     Kidney Function Lab Results  Component Value Date/Time   CREATININE 1.55 (H) 05/07/2021 10:00 AM   CREATININE 1.50 (H) 04/29/2021 08:27 AM   GFRNONAA 39 (L) 05/07/2020 11:40 AM   GFRAA 45 (L) 05/07/2020 11:40 AM       Latest Ref Rng & Units 05/07/2021   10:00 AM 04/29/2021    8:27 AM 05/07/2020   11:40 AM  BMP  Glucose 70 - 99 mg/dL 90  104  105   BUN 8 - 27 mg/dL '26  25  24   '$ Creatinine 0.57 - 1.00 mg/dL 1.55  1.50  1.34   BUN/Creat Ratio 12 - '28 17  17  18   '$ Sodium 134 - 144 mmol/L 139  140  141   Potassium 3.5 - 5.2 mmol/L 4.8  4.4  4.7   Chloride 96 - 106 mmol/L 104  107  108   CO2 20 - 29 mmol/L '21  21  20   '$ Calcium 8.7 - 10.3 mg/dL 9.1  8.9  9.1      Current antihypertensive regimen:  Metoprolol Tartrate '50mg'$  twice daily  Chlorthalidone 25 mg daily  Clonidine 0.3 mg  take 1/2 tablet in the morning and 1 tablet in the evening  Patient verbally confirms she is taking the above medications as directed. {yes/no:20286}  How often are you checking your Blood Pressure? {CHL HP BP Monitoring [phone]}  she checks her blood pressure {timing:25218} {before/after:25217} taking her medication.  Current home BP readings:   Wrist or arm cuff: Caffeine intake: Salt intake: OTC medications including pseudoephedrine or NSAIDs?  Any readings above 180/120? {yes/no:20286} If yes any symptoms of hypertensive emergency? {hypertensive emergency symptoms:25354}   What recent interventions/DTPs have been made by any provider to improve Blood Pressure control since last CPP Visit: ***  Any recent hospitalizations or ED visits since last visit with CPP? {yes/no:20286}  What diet changes have been made to improve Blood Pressure Control?  ***  What exercise is being done to improve your Blood Pressure Control?   ***  Adherence Review: Is the patient currently on ACE/ARB medication? No Does the patient have >5 day gap between last estimated fill dates? CPP to review  Care Gaps: Last annual wellness visit? Scheduled 12/31/21  Star Rating Drugs:  Medication:  Last Fill: Day Supply  None noted  Elray Mcgregor, [place] Pharmacist Assistant  [phone]

## 2021-11-11 ENCOUNTER — Telehealth: Payer: Self-pay

## 2021-11-11 NOTE — Telephone Encounter (Signed)
Patient called requesting an appointment for herself as she has ST, chest congestion, and cough. She had negative covid test yesterday and symtpoms began Friday. Patient then requested a follow up appointment for husband as he was seen last Wednesday and is not much better w/ headache. When attempting to schedule patient, I made her aware they could be seen together by Gay Filler. Emony did not like this and wanted to be seen by Dr Cox only. Made Nichele aware at the time there was only one opening for Dr Cox at 4 pm and both of them could not be seen in one slot. She insisted Dr Tobie Poet be asked if both could be seen at 4 pm by Dr Tobie Poet and let her know, if not then she would like Juanda Crumble to be seen over her. Neither patient was scheduled while awaited answer from Dr Tobie Poet per patient.   Royce Macadamia, [place] 11/11/21 9:25 AM

## 2021-11-11 NOTE — Telephone Encounter (Signed)
I called Ms. Seedorf back to schedule her and her husband an appointment with Gay Filler. However, by the time the provider responded to the original message, any openings we had for today had been filled. I told Ms. Reiswig that we have no openings for today but I would be happy to look for tomorrow to get them scheduled. Ms. Spranger stated that Juanda Crumble is on the schedule at 4 today and the nurse was going to see if I could be worked in with him. Ms. Mireles was notified that Juanda Crumble is not on the schedule. I tried to explain to her that the 4 o'clock appointment was open, however we had a scheduling error for the PCP chart review for the provider. I tried to explain was a chart review was to Ms. Fossett but was upset and cut me off. I apologized for the miscommunication and she told me not to apologize and that she was upset. I told her I would send Dr. Tobie Poet a message. Ms. Nuccio stated that she will speak with Dr. Tobie Poet about this. She stated that Juanda Crumble needs to be seen he is having a hard time walking and was unsure if she could get him in here. When she stated that I spoke with Hoyle Sauer and transferred the call to her.

## 2021-11-13 ENCOUNTER — Telehealth: Payer: Self-pay

## 2021-11-13 NOTE — Progress Notes (Signed)
Care Gap(s) Not Met that Need to be Addressed:   Controlling High Blood Pressure   Action Taken: Updated care gap list with latest BP 11/12/21 140/72 and 08/05/21 114/72   Follow Up: 12/10/21 with Dr. Tobie Poet

## 2021-11-18 ENCOUNTER — Other Ambulatory Visit: Payer: Self-pay | Admitting: Family Medicine

## 2021-11-18 DIAGNOSIS — E538 Deficiency of other specified B group vitamins: Secondary | ICD-10-CM | POA: Diagnosis not present

## 2021-11-18 DIAGNOSIS — E559 Vitamin D deficiency, unspecified: Secondary | ICD-10-CM

## 2021-11-25 LAB — VITAMIN B12: Vitamin B-12: >2000 pg/mL — ABNORMAL HIGH (ref 232–1245)

## 2021-11-25 LAB — VITAMIN D 25 HYDROXY (VIT D DEFICIENCY, FRACTURES): Vit D, 25-Hydroxy: 33.8 ng/mL (ref 30.0–100.0)

## 2021-11-25 LAB — METHYLMALONIC ACID, SERUM: Methylmalonic Acid: 232 nmol/L (ref 0–378)

## 2021-12-09 NOTE — Progress Notes (Unsigned)
Subjective:  Patient ID: Cynthia Gardner, female    DOB: September 04, 1945  Age: 76 y.o. MRN: [id number]  No chief complaint on file.   HPI  Hypothyroidism: Levothyroxine 75 mcg daily.  Hyperlipidemia:  Current medications:  Hypertension: Complications: Current medications: Chlorthalidone 25 mg everyday, clonidine 0.3 mg daily, Metoprolol 50 mg daily.  Antiphospholipid syndrome:  Eliquis 5 mg twice a day.  Diet: Exercise:   Current Outpatient Medications on File Prior to Visit  Medication Sig Dispense Refill   apixaban (ELIQUIS) 5 MG TABS tablet Take 1 tablet (5 mg total) by mouth 2 (two) times daily. 60 tablet 5   cetirizine (ZYRTEC) 10 MG tablet TAKE 1 TABLET BY MOUTH EVERY DAY 90 tablet 3   chlorthalidone (HYGROTON) 25 MG tablet TAKE 1 TABLET BY MOUTH EVERY DAY 90 tablet 1   Cholecalciferol (VITAMIN D3 PO) Take 1 tablet by mouth daily.     cloNIDine (CATAPRES) 0.3 MG tablet TAKE 1 TABLET BY MOUTH TWICE A DAY 180 tablet 1   CVS CHEWABLE C WITH ROSE HIPS 500 MG CHEW TAKE 1 TABLET BY MOUTH EVERY DAY 100 tablet 1   diclofenac Sodium (VOLTAREN) 1 % GEL Apply four times a day to hip. 1000 g 1   diphenoxylate-atropine (LOMOTIL) 2.5-0.025 MG tablet Take 1 tablet by mouth 4 (four) times daily as needed for diarrhea or loose stools. 60 tablet 2   fluticasone (FLONASE) 50 MCG/ACT nasal spray SPRAY 2 SPRAYS INTO EACH NOSTRIL EVERY DAY 48 mL 5   folic acid (FOLVITE) 1 MG tablet TAKE 1 TABLET BY MOUTH EVERY DAY 90 tablet 1   levothyroxine (SYNTHROID) 75 MCG tablet TAKE 1 TABLET BY MOUTH EVERY DAY BEFORE BREAKFAST 90 tablet 1   metoprolol tartrate (LOPRESSOR) 50 MG tablet TAKE 1 TABLET BY MOUTH TWICE A DAY 180 tablet 3   Multiple Vitamins-Minerals (ZINC PO) Take 1 tablet by mouth daily.     potassium chloride (MICRO-K) 10 MEQ CR capsule TAKE 4 CAPSULES BY MOUTH EVERY DAY 360 capsule 1   Vitamin D, Ergocalciferol, (DRISDOL) 1.25 MG (50000 UNIT) CAPS capsule TAKE 1 CAPSULE BY MOUTH ONE TIME PER WEEK 12  capsule 0   Zinc Sulfate (ZINC-220 PO) Take by mouth.     No current facility-administered medications on file prior to visit.   Past Medical History:  Diagnosis Date   Antiphospholipid antibody syndrome (HCC)    CVA (cerebral vascular accident) ([place]) 04/06/2019   History of COVID-19    HTN (hypertension) 04/06/2019   Hypothyroidism 04/06/2019   Leukemia ([place])    Past Surgical History:  Procedure Laterality Date   CATARACT EXTRACTION Right    COLON RESECTION  2008   ischemic bowel    Family History  Problem Relation Age of Onset   Parkinson's disease Father    Cirrhosis Brother        from agent orange   Hypertension Other    Diabetes Other    Social History   Socioeconomic History   Marital status: Married    Spouse name: Not on file   Number of children: 2   Years of education: Not on file   Highest education level: Not on file  Occupational History   Not on file  Tobacco Use   Smoking status: Former   Smokeless tobacco: Never  Vaping Use   Vaping Use: Never used  Substance and Sexual Activity   Alcohol use: Not Currently   Drug use: Never   Sexual activity: Not Currently  Birth control/protection: None  Other Topics Concern   Not on file  Social History Narrative   Not on file   Social Determinants of Health   Financial Resource Strain: Low Risk  (12/27/2020)   Overall Financial Resource Strain (CARDIA)    Difficulty of Paying Living Expenses: Not hard at all  Food Insecurity: No Food Insecurity (12/27/2020)   Hunger Vital Sign    Worried About Running Out of Food in the Last Year: Never true    Ran Out of Food in the Last Year: Never true  Transportation Needs: No Transportation Needs (12/27/2020)   PRAPARE - Hydrologist (Medical): No    Lack of Transportation (Non-Medical): No  Physical Activity: Inactive (12/27/2020)   Exercise Vital Sign    Days of Exercise per Week: 0 days    Minutes of Exercise per Session: 0 min   Stress: No Stress Concern Present (12/27/2020)   [place]    Feeling of Stress : Not at all  Social Connections: [place] (12/27/2020)   Social Connection and Isolation Panel [NHANES]    Frequency of Communication with Friends and Family: More than three times a week    Frequency of Social Gatherings with Friends and Family: Three times a week    Attends Religious Services: More than 4 times per year    Active Member of Clubs or Organizations: Yes    Attends Archivist Meetings: Never    Marital Status: Married    Review of Systems   Objective:  There were no vitals taken for this visit.     08/05/2021    7:39 AM 04/29/2021    8:10 AM 04/29/2021    7:39 AM  BP/Weight  Systolic BP 502 774 128  Diastolic BP 72 72 90  Wt. (Lbs) 176  177  BMI 28.41 kg/m2  28.57 kg/m2    Physical Exam  Diabetic Foot Exam - Simple   No data filed      Lab Results  Component Value Date   WBC 36.4 (HH) 04/29/2021   HGB 11.7 04/29/2021   HCT 34.9 04/29/2021   PLT 263 04/29/2021   GLUCOSE 90 05/07/2021   CHOL 160 04/29/2021   TRIG 143 04/29/2021   HDL 40 04/29/2021   LDLCALC 95 04/29/2021   ALT 9 05/07/2021   AST 13 05/07/2021   NA 139 05/07/2021   K 4.8 05/07/2021   CL 104 05/07/2021   CREATININE 1.55 (H) 05/07/2021   BUN 26 05/07/2021   CO2 21 05/07/2021   TSH 2.810 04/29/2021   HGBA1C 5.6 04/29/2021      Assessment & Plan:   Problem List Items Addressed This Visit       Cardiovascular and Mediastinum   HTN (hypertension) - Primary (Chronic)   Paroxysmal atrial fibrillation (HCC)     Endocrine   Hypothyroidism (Chronic)     Hematopoietic and Hemostatic   Antiphospholipid syndrome (HCC)     Other   Mixed hyperlipidemia   Prediabetes   Vitamin D insufficiency  .  No orders of the defined types were placed in this encounter.   No orders of the defined types were placed in  this encounter.    Follow-up: No follow-ups on file.  An After Visit Summary was printed and given to the patient.  Rochel Brome, MD [facility] [phone]

## 2021-12-10 ENCOUNTER — Encounter: Payer: Self-pay | Admitting: Family Medicine

## 2021-12-10 ENCOUNTER — Ambulatory Visit (INDEPENDENT_AMBULATORY_CARE_PROVIDER_SITE_OTHER): Payer: Medicare HMO | Admitting: Family Medicine

## 2021-12-10 VITALS — BP 120/84 | HR 68 | Temp 94.2°F | Resp 18 | Ht 66.0 in | Wt 172.0 lb

## 2021-12-10 DIAGNOSIS — R7303 Prediabetes: Secondary | ICD-10-CM | POA: Diagnosis not present

## 2021-12-10 DIAGNOSIS — E559 Vitamin D deficiency, unspecified: Secondary | ICD-10-CM

## 2021-12-10 DIAGNOSIS — T466X5A Adverse effect of antihyperlipidemic and antiarteriosclerotic drugs, initial encounter: Secondary | ICD-10-CM

## 2021-12-10 DIAGNOSIS — H6123 Impacted cerumen, bilateral: Secondary | ICD-10-CM | POA: Diagnosis not present

## 2021-12-10 DIAGNOSIS — C9110 Chronic lymphocytic leukemia of B-cell type not having achieved remission: Secondary | ICD-10-CM | POA: Diagnosis not present

## 2021-12-10 DIAGNOSIS — E038 Other specified hypothyroidism: Secondary | ICD-10-CM | POA: Diagnosis not present

## 2021-12-10 DIAGNOSIS — D6869 Other thrombophilia: Secondary | ICD-10-CM

## 2021-12-10 DIAGNOSIS — I10 Essential (primary) hypertension: Secondary | ICD-10-CM

## 2021-12-10 DIAGNOSIS — D6861 Antiphospholipid syndrome: Secondary | ICD-10-CM | POA: Diagnosis not present

## 2021-12-10 DIAGNOSIS — Z23 Encounter for immunization: Secondary | ICD-10-CM | POA: Insufficient documentation

## 2021-12-10 DIAGNOSIS — E782 Mixed hyperlipidemia: Secondary | ICD-10-CM | POA: Diagnosis not present

## 2021-12-10 DIAGNOSIS — I480 Paroxysmal atrial fibrillation: Secondary | ICD-10-CM | POA: Diagnosis not present

## 2021-12-10 DIAGNOSIS — M791 Myalgia, unspecified site: Secondary | ICD-10-CM | POA: Diagnosis not present

## 2021-12-10 DIAGNOSIS — H9313 Tinnitus, bilateral: Secondary | ICD-10-CM | POA: Insufficient documentation

## 2021-12-10 NOTE — Assessment & Plan Note (Signed)
Follow with oncology 

## 2021-12-10 NOTE — Assessment & Plan Note (Signed)
Continue eliquis  ?

## 2021-12-10 NOTE — Assessment & Plan Note (Signed)
Due to eliquis which is needed.

## 2021-12-10 NOTE — Assessment & Plan Note (Signed)
- 

## 2021-12-10 NOTE — Assessment & Plan Note (Signed)
Recommend continue to work on eating healthy diet and exercise.  

## 2021-12-10 NOTE — Assessment & Plan Note (Signed)
Cerumen irrigation by Meredith Mody, RN. Partially cleared.  Use debrox.  Refer to ent.

## 2021-12-10 NOTE — Assessment & Plan Note (Signed)
Well controlled.  ?No changes to medicines.  ?Continue to work on eating a healthy diet and exercise.  ?Labs drawn today.  ?

## 2021-12-10 NOTE — Patient Instructions (Addendum)
Trial on bioflavanoids for ringing in ears.  Use debrox. Refer to ENT specialist

## 2021-12-10 NOTE — Assessment & Plan Note (Signed)
refer to ENT

## 2021-12-10 NOTE — Assessment & Plan Note (Signed)
Previously well controlled Continue Synthroid at current dose  Recheck TSH and adjust Synthroid as indicated   

## 2021-12-10 NOTE — Assessment & Plan Note (Signed)
Intolerant to statins. 

## 2021-12-11 ENCOUNTER — Other Ambulatory Visit: Payer: Self-pay | Admitting: Family Medicine

## 2021-12-11 LAB — LIPID PANEL
Chol/HDL Ratio: 4.5 ratio — ABNORMAL HIGH (ref 0.0–4.4)
Cholesterol, Total: 152 mg/dL (ref 100–199)
HDL: 34 mg/dL — ABNORMAL LOW
LDL Chol Calc (NIH): 92 mg/dL (ref 0–99)
Triglycerides: 148 mg/dL (ref 0–149)
VLDL Cholesterol Cal: 26 mg/dL (ref 5–40)

## 2021-12-11 LAB — CBC WITH DIFFERENTIAL/PLATELET
Basophils Absolute: 0.2 10*3/uL (ref 0.0–0.2)
Basos: 1 %
EOS (ABSOLUTE): 0.7 10*3/uL — ABNORMAL HIGH (ref 0.0–0.4)
Eos: 2 %
Hematocrit: 36.4 % (ref 34.0–46.6)
Hemoglobin: 12 g/dL (ref 11.1–15.9)
Immature Grans (Abs): 0 10*3/uL (ref 0.0–0.1)
Immature Granulocytes: 0 %
Lymphocytes Absolute: 33.5 10*3/uL — ABNORMAL HIGH (ref 0.7–3.1)
Lymphs: 79 %
MCH: 30.5 pg (ref 26.6–33.0)
MCHC: 33 g/dL (ref 31.5–35.7)
MCV: 92 fL (ref 79–97)
Monocytes Absolute: 1.2 10*3/uL — ABNORMAL HIGH (ref 0.1–0.9)
Monocytes: 3 %
Neutrophils Absolute: 6.4 10*3/uL (ref 1.4–7.0)
Neutrophils: 15 %
Platelets: 316 10*3/uL (ref 150–450)
RBC: 3.94 x10E6/uL (ref 3.77–5.28)
RDW: 12.8 % (ref 11.7–15.4)
WBC: 42.2 10*3/uL (ref 3.4–10.8)

## 2021-12-11 LAB — COMPREHENSIVE METABOLIC PANEL WITH GFR
ALT: 9 [IU]/L (ref 0–32)
AST: 14 [IU]/L (ref 0–40)
Albumin/Globulin Ratio: 1.7 (ref 1.2–2.2)
Albumin: 4 g/dL (ref 3.8–4.8)
Alkaline Phosphatase: 102 [IU]/L (ref 44–121)
BUN/Creatinine Ratio: 18 (ref 12–28)
BUN: 28 mg/dL — ABNORMAL HIGH (ref 8–27)
Bilirubin Total: 0.7 mg/dL (ref 0.0–1.2)
CO2: 19 mmol/L — ABNORMAL LOW (ref 20–29)
Calcium: 9 mg/dL (ref 8.7–10.3)
Chloride: 105 mmol/L (ref 96–106)
Creatinine, Ser: 1.6 mg/dL — ABNORMAL HIGH (ref 0.57–1.00)
Globulin, Total: 2.4 g/dL (ref 1.5–4.5)
Glucose: 97 mg/dL (ref 70–99)
Potassium: 4.7 mmol/L (ref 3.5–5.2)
Sodium: 139 mmol/L (ref 134–144)
Total Protein: 6.4 g/dL (ref 6.0–8.5)
eGFR: 33 mL/min/{1.73_m2} — ABNORMAL LOW

## 2021-12-11 LAB — TSH: TSH: 1.54 u[IU]/mL (ref 0.450–4.500)

## 2021-12-11 LAB — CARDIOVASCULAR RISK ASSESSMENT

## 2021-12-11 LAB — VITAMIN D 25 HYDROXY (VIT D DEFICIENCY, FRACTURES): Vit D, 25-Hydroxy: 32 ng/mL (ref 30.0–100.0)

## 2021-12-11 LAB — HEMOGLOBIN A1C
Est. average glucose Bld gHb Est-mCnc: 120 mg/dL
Hgb A1c MFr Bld: 5.8 % — ABNORMAL HIGH (ref 4.8–5.6)

## 2021-12-11 NOTE — Progress Notes (Signed)
Blood count abnormal.  CLL. Send results to Dr. Humphrey Rolls, her oncologist.  Liver function normal.  Kidney function abnormal. Ask if she has seen nephrology. If not, I would recommend a referral.  Thyroid function normal.  Cholesterol: good HBA1C: 5.8. Vitamin D normal.

## 2021-12-12 ENCOUNTER — Other Ambulatory Visit: Payer: Self-pay

## 2021-12-12 DIAGNOSIS — R944 Abnormal results of kidney function studies: Secondary | ICD-10-CM

## 2021-12-16 ENCOUNTER — Encounter: Payer: Self-pay | Admitting: Family Medicine

## 2021-12-16 ENCOUNTER — Ambulatory Visit (INDEPENDENT_AMBULATORY_CARE_PROVIDER_SITE_OTHER): Payer: Medicare HMO | Admitting: Family Medicine

## 2021-12-16 VITALS — BP 134/84 | HR 68 | Temp 97.0°F | Ht 66.0 in | Wt 175.0 lb

## 2021-12-16 DIAGNOSIS — R300 Dysuria: Secondary | ICD-10-CM | POA: Diagnosis not present

## 2021-12-16 LAB — POCT URINALYSIS DIPSTICK
Bilirubin, UA: NEGATIVE
Blood, UA: NEGATIVE
Glucose, UA: NEGATIVE
Ketones, UA: NEGATIVE
Leukocytes, UA: NEGATIVE
Nitrite, UA: NEGATIVE
Protein, UA: NEGATIVE
Spec Grav, UA: 1.025
Urobilinogen, UA: NEGATIVE U/dL — AB
pH, UA: 5

## 2021-12-16 MED ORDER — PHENAZOPYRIDINE HCL 200 MG PO TABS: 200.0000 mg | ORAL_TABLET | Freq: Three times a day (TID) | ORAL | 0 refills | Status: DC | PRN

## 2021-12-16 NOTE — Assessment & Plan Note (Signed)
No evidence of UTI.  Rx for pyridium sent.  If worsening symptoms, call back and we can collect another urine or may just call an antibiotic in.

## 2021-12-16 NOTE — Progress Notes (Signed)
Acute Office Visit  Subjective:    Patient ID: Cynthia Gardner, female    DOB: [birth year], 76 y.o.   MRN: [id number]  Chief Complaint  Patient presents with   Urinary Tract Infection    Urinary Tract Infection  This is a new problem. The current episode started in the past 7 days. The problem occurs intermittently. The problem has been gradually improving. The quality of the pain is described as burning. There has been no fever. Associated symptoms include flank pain and frequency. Pertinent negatives include no hematuria, nausea or vomiting. She has tried increased fluids (Cranberry juice) for the symptoms. The treatment provided mild relief.   Patient is in today for   Past Medical History:  Diagnosis Date   Antiphospholipid antibody syndrome ([place])    CVA (cerebral vascular accident) ([place]) 04/06/2019   History of COVID-19    HTN (hypertension) 04/06/2019   Hypothyroidism 04/06/2019   Leukemia ([place])     Past Surgical History:  Procedure Laterality Date   CATARACT EXTRACTION Right    COLON RESECTION  2008   ischemic bowel    Family History  Problem Relation Age of Onset   Parkinson's disease Father    Cirrhosis Brother        from agent orange   Hypertension Other    Diabetes Other     Social History   Socioeconomic History   Marital status: Married    Spouse name: Not on file   Number of children: 2   Years of education: Not on file   Highest education level: Not on file  Occupational History   Not on file  Tobacco Use   Smoking status: Former   Smokeless tobacco: Never  Vaping Use   Vaping Use: Never used  Substance and Sexual Activity   Alcohol use: Not Currently   Drug use: Never   Sexual activity: Not Currently    Birth control/protection: None  Other Topics Concern   Not on file  Social History Narrative   Not on file   Social Determinants of Health   Financial Resource Strain: Low Risk  (12/27/2020)   Overall Financial Resource Strain (CARDIA)     Difficulty of Paying Living Expenses: Not hard at all  Food Insecurity: No Food Insecurity (12/27/2020)   Hunger Vital Sign    Worried About Running Out of Food in the Last Year: Never true    Ran Out of Food in the Last Year: Never true  Transportation Needs: No Transportation Needs (12/27/2020)   PRAPARE - Hydrologist (Medical): No    Lack of Transportation (Non-Medical): No  Physical Activity: Inactive (12/27/2020)   Exercise Vital Sign    Days of Exercise per Week: 0 days    Minutes of Exercise per Session: 0 min  Stress: No Stress Concern Present (12/27/2020)   [place]    Feeling of Stress : Not at all  Social Connections: [place] (12/27/2020)   Social Connection and Isolation Panel [NHANES]    Frequency of Communication with Friends and Family: More than three times a week    Frequency of Social Gatherings with Friends and Family: Three times a week    Attends Religious Services: More than 4 times per year    Active Member of Clubs or Organizations: Yes    Attends Archivist Meetings: Never    Marital Status: Married  Human resources officer Violence: Not At Risk (  12/27/2020)   Humiliation, Afraid, Rape, and Kick questionnaire    Fear of Current or Ex-Partner: No    Emotionally Abused: No    Physically Abused: No    Sexually Abused: No    Outpatient Medications Prior to Visit  Medication Sig Dispense Refill   apixaban (ELIQUIS) 5 MG TABS tablet Take 1 tablet (5 mg total) by mouth 2 (two) times daily. 60 tablet 5   cetirizine (ZYRTEC) 10 MG tablet TAKE 1 TABLET BY MOUTH EVERY DAY 90 tablet 3   chlorthalidone (HYGROTON) 25 MG tablet TAKE 1 TABLET BY MOUTH EVERY DAY 90 tablet 1   Cholecalciferol (VITAMIN D3 PO) Take 1 tablet by mouth daily.     cloNIDine (CATAPRES) 0.3 MG tablet TAKE 1 TABLET BY MOUTH TWICE A DAY 180 tablet 1   CVS CHEWABLE C WITH ROSE HIPS 500 MG CHEW  TAKE 1 TABLET BY MOUTH EVERY DAY 100 tablet 1   diclofenac Sodium (VOLTAREN) 1 % GEL Apply four times a day to hip. 1000 g 1   diphenoxylate-atropine (LOMOTIL) 2.5-0.025 MG tablet Take 1 tablet by mouth 4 (four) times daily as needed for diarrhea or loose stools. 60 tablet 2   fluticasone (FLONASE) 50 MCG/ACT nasal spray SPRAY 2 SPRAYS INTO EACH NOSTRIL EVERY DAY 48 mL 5   folic acid (FOLVITE) 1 MG tablet TAKE 1 TABLET BY MOUTH EVERY DAY 90 tablet 1   levothyroxine (SYNTHROID) 75 MCG tablet TAKE 1 TABLET BY MOUTH EVERY DAY BEFORE BREAKFAST 90 tablet 1   metoprolol tartrate (LOPRESSOR) 50 MG tablet TAKE 1 TABLET BY MOUTH TWICE A DAY 180 tablet 3   Multiple Vitamins-Minerals (ZINC PO) Take 1 tablet by mouth daily.     potassium chloride (MICRO-K) 10 MEQ CR capsule TAKE 4 CAPSULES BY MOUTH EVERY DAY 360 capsule 1   Vitamin D, Ergocalciferol, (DRISDOL) 1.25 MG (50000 UNIT) CAPS capsule TAKE 1 CAPSULE BY MOUTH ONE TIME PER WEEK 12 capsule 0   Zinc Sulfate (ZINC-220 PO) Take by mouth.     No facility-administered medications prior to visit.    No Known Allergies  Review of Systems  Constitutional:  Negative for appetite change, fatigue and fever.  HENT:  Negative for congestion, ear pain, sinus pressure and sore throat.   Respiratory:  Negative for cough, chest tightness, shortness of breath and wheezing.   Cardiovascular:  Negative for chest pain and palpitations.  Gastrointestinal:  Negative for abdominal pain, constipation, diarrhea, nausea and vomiting.  Genitourinary:  Positive for dysuria, flank pain and frequency. Negative for hematuria.  Musculoskeletal:  Negative for arthralgias, back pain, joint swelling and myalgias.  Skin:  Negative for rash.  Neurological:  Negative for dizziness, weakness and headaches.  Psychiatric/Behavioral:  Negative for dysphoric mood. The patient is not nervous/anxious.        Objective:    Physical Exam Vitals reviewed.  Constitutional:       Appearance: Normal appearance. She is normal weight.  Cardiovascular:     Rate and Rhythm: Normal rate and regular rhythm.     Heart sounds: Normal heart sounds.  Pulmonary:     Effort: Pulmonary effort is normal.     Breath sounds: Normal breath sounds.  Abdominal:     General: Abdomen is flat. Bowel sounds are normal.     Palpations: Abdomen is soft.     Tenderness: There is no abdominal tenderness.  Neurological:     Mental Status: She is alert and oriented to person, place, and time.  Psychiatric:        Mood and Affect: Mood normal.        Behavior: Behavior normal.     BP 134/84 (BP Location: Right Arm, Patient Position: Sitting)   Pulse 68   Temp (!) 97 F (36.1 C) (Temporal)   Ht 5' 6" (1.676 m)   Wt 175 lb (79.4 kg)   SpO2 98%   BMI 28.25 kg/m  Wt Readings from Last 3 Encounters:  12/16/21 175 lb (79.4 kg)  12/10/21 172 lb (78 kg)  08/05/21 176 lb (79.8 kg)    Health Maintenance Due  Topic Date Due   Hepatitis C Screening  Never done   TETANUS/TDAP  Never done   Zoster Vaccines- Shingrix (1 of 2) Never done   COVID-19 Vaccine (5 - Moderna risk series) 03/18/2021    There are no preventive care reminders to display for this patient.   Lab Results  Component Value Date   TSH 1.540 12/10/2021   Lab Results  Component Value Date   WBC 42.2 (HH) 12/10/2021   HGB 12.0 12/10/2021   HCT 36.4 12/10/2021   MCV 92 12/10/2021   PLT 316 12/10/2021   Lab Results  Component Value Date   NA 139 12/10/2021   K 4.7 12/10/2021   CO2 19 (L) 12/10/2021   GLUCOSE 97 12/10/2021   BUN 28 (H) 12/10/2021   CREATININE 1.60 (H) 12/10/2021   BILITOT 0.7 12/10/2021   ALKPHOS 102 12/10/2021   AST 14 12/10/2021   ALT 9 12/10/2021   PROT 6.4 12/10/2021   ALBUMIN 4.0 12/10/2021   CALCIUM 9.0 12/10/2021   ANIONGAP 10 04/18/2019   EGFR 33 (L) 12/10/2021   Lab Results  Component Value Date   CHOL 152 12/10/2021   Lab Results  Component Value Date   HDL 34 (L)  12/10/2021   Lab Results  Component Value Date   LDLCALC 92 12/10/2021   Lab Results  Component Value Date   TRIG 148 12/10/2021   Lab Results  Component Value Date   CHOLHDL 4.5 (H) 12/10/2021   Lab Results  Component Value Date   HGBA1C 5.8 (H) 12/10/2021         Assessment & Plan:   Problem List Items Addressed This Visit       Other   Dysuria - Primary    No evidence of UTI.  Rx for pyridium sent.  If worsening symptoms, call back and we can collect another urine or may just call an antibiotic in.       Relevant Orders   POCT urinalysis dipstick (Completed)     Meds ordered this encounter  Medications   phenazopyridine (PYRIDIUM) 200 MG tablet    Sig: Take 1 tablet (200 mg total) by mouth 3 (three) times daily as needed for pain.    Dispense:  12 tablet    Refill:  0    I,Lauren M Auman,acting as a scribe for Rochel Brome, MD.,have documented all relevant documentation on the behalf of Rochel Brome, MD,as directed by  Rochel Brome, MD while in the presence of Rochel Brome, MD.   Follow up: as needed   Rochel Brome, MD

## 2022-01-14 DIAGNOSIS — C9110 Chronic lymphocytic leukemia of B-cell type not having achieved remission: Secondary | ICD-10-CM | POA: Diagnosis not present

## 2022-01-15 ENCOUNTER — Telehealth: Payer: Self-pay

## 2022-01-15 NOTE — Progress Notes (Signed)
Chronic Care Management Pharmacy Assistant   Name: Cynthia Gardner  MRN: [id number] DOB: 23-Apr-1945   Reason for Encounter: Disease State call for HTN   Recent office visits:  12/16/21 Rochel Brome MD. Seen for Dysuria. Started on Phenazopyridine '200mg'$ .  12/10/21 Rochel Brome MD. Seen for routine visit. Referral to ENT. No med changes.  11/18/21 Rochel Brome MD. Orders Only. D/C Vitamin B12 2548mg.   Recent consult visits:  None  Hospital visits:  None  Medications: Outpatient Encounter Medications as of 01/15/2022  Medication Sig   apixaban (ELIQUIS) 5 MG TABS tablet Take 1 tablet (5 mg total) by mouth 2 (two) times daily.   cetirizine (ZYRTEC) 10 MG tablet TAKE 1 TABLET BY MOUTH EVERY DAY   chlorthalidone (HYGROTON) 25 MG tablet TAKE 1 TABLET BY MOUTH EVERY DAY   Cholecalciferol (VITAMIN D3 PO) Take 1 tablet by mouth daily.   cloNIDine (CATAPRES) 0.3 MG tablet TAKE 1 TABLET BY MOUTH TWICE A DAY   [facility] CHEWABLE C WITH ROSE HIPS 500 MG CHEW TAKE 1 TABLET BY MOUTH EVERY DAY   diclofenac Sodium (VOLTAREN) 1 % GEL Apply four times a day to hip.   diphenoxylate-atropine (LOMOTIL) 2.5-0.025 MG tablet Take 1 tablet by mouth 4 (four) times daily as needed for diarrhea or loose stools.   fluticasone (FLONASE) 50 MCG/ACT nasal spray SPRAY 2 SPRAYS INTO EACH NOSTRIL EVERY DAY   folic acid (FOLVITE) 1 MG tablet TAKE 1 TABLET BY MOUTH EVERY DAY   levothyroxine (SYNTHROID) 75 MCG tablet TAKE 1 TABLET BY MOUTH EVERY DAY BEFORE BREAKFAST   metoprolol tartrate (LOPRESSOR) 50 MG tablet TAKE 1 TABLET BY MOUTH TWICE A DAY   Multiple Vitamins-Minerals (ZINC PO) Take 1 tablet by mouth daily.   phenazopyridine (PYRIDIUM) 200 MG tablet Take 1 tablet (200 mg total) by mouth 3 (three) times daily as needed for pain.   potassium chloride (MICRO-K) 10 MEQ CR capsule TAKE 4 CAPSULES BY MOUTH EVERY DAY   Vitamin D, Ergocalciferol, (DRISDOL) 1.25 MG (50000 UNIT) CAPS capsule TAKE 1 CAPSULE BY MOUTH ONE TIME  PER WEEK   Zinc Sulfate (ZINC-220 PO) Take by mouth.   No facility-administered encounter medications on file as of 01/15/2022.     Recent Office Vitals: BP Readings from Last 3 Encounters:  12/16/21 134/84  12/10/21 120/84  08/05/21 114/72   Pulse Readings from Last 3 Encounters:  12/16/21 68  12/10/21 68  08/05/21 60    Wt Readings from Last 3 Encounters:  12/16/21 175 lb (79.4 kg)  12/10/21 172 lb (78 kg)  08/05/21 176 lb (79.8 kg)     Kidney Function Lab Results  Component Value Date/Time   CREATININE 1.60 (H) 12/10/2021 08:33 AM   CREATININE 1.55 (H) 05/07/2021 10:00 AM   GFRNONAA 39 (L) 05/07/2020 11:40 AM   GFRAA 45 (L) 05/07/2020 11:40 AM       Latest Ref Rng & Units 12/10/2021    8:33 AM 05/07/2021   10:00 AM 04/29/2021    8:27 AM  BMP  Glucose 70 - 99 mg/dL 97  90  104   BUN 8 - 27 mg/dL '28  26  25   '$ Creatinine 0.57 - 1.00 mg/dL 1.60  1.55  1.50   BUN/Creat Ratio 12 - '28 18  17  17   '$ Sodium 134 - 144 mmol/L 139  139  140   Potassium 3.5 - 5.2 mmol/L 4.7  4.8  4.4   Chloride 96 - 106 mmol/L 105  104  107   CO2 20 - 29 mmol/L '19  21  21   '$ Calcium 8.7 - 10.3 mg/dL 9.0  9.1  8.9      Current antihypertensive regimen:  Metoprolol Tartrate '50mg'$  twice daily  Chlorthalidone 25 mg daily  Clonidine 0.3 mg take 1/2 tablet in the morning and 1 tablet in the evening   Adherence Review: Is the patient currently on ACE/ARB medication? No Does the patient have >5 day gap between last estimated fill dates? CPP to review  Care Gaps: Last annual wellness visit?None noted  Star Rating Drugs:  Medication:  Last Fill: Day Supply None noted   Unable to reach pt to complete this call   Elray Mcgregor, [place] Pharmacist Assistant  [phone]

## 2022-01-17 ENCOUNTER — Other Ambulatory Visit: Payer: Self-pay | Admitting: Family Medicine

## 2022-01-17 ENCOUNTER — Other Ambulatory Visit: Payer: Self-pay | Admitting: Physician Assistant

## 2022-01-20 ENCOUNTER — Telehealth: Payer: Self-pay

## 2022-01-20 NOTE — Telephone Encounter (Signed)
Patient received a text that she has refill of Chlorthalidone 25 mg daily. She states that she is not taking this medication. Please advice

## 2022-03-05 ENCOUNTER — Telehealth: Payer: Self-pay

## 2022-03-05 NOTE — Progress Notes (Signed)
Chronic Care Management Pharmacy Assistant   Name: Cynthia Gardner  MRN: [id number] DOB: 01/30/1946   Reason for Encounter: Disease State call for HTN   Recent office visits:  None  Recent consult visits:  None  Hospital visits:  None  Medications: Outpatient Encounter Medications as of 03/05/2022  Medication Sig   cetirizine (ZYRTEC) 10 MG tablet TAKE 1 TABLET BY MOUTH EVERY DAY   chlorthalidone (HYGROTON) 25 MG tablet TAKE 1 TABLET BY MOUTH EVERY DAY   Cholecalciferol (VITAMIN D3 PO) Take 1 tablet by mouth daily.   cloNIDine (CATAPRES) 0.3 MG tablet TAKE 1 TABLET BY MOUTH TWICE A DAY   CVS CHEWABLE C WITH ROSE HIPS 500 MG CHEW TAKE 1 TABLET BY MOUTH EVERY DAY   diclofenac Sodium (VOLTAREN) 1 % GEL Apply four times a day to hip.   diphenoxylate-atropine (LOMOTIL) 2.5-0.025 MG tablet Take 1 tablet by mouth 4 (four) times daily as needed for diarrhea or loose stools.   ELIQUIS 5 MG TABS tablet TAKE 1 TABLET BY MOUTH TWICE A DAY   fluticasone (FLONASE) 50 MCG/ACT nasal spray SPRAY 2 SPRAYS INTO EACH NOSTRIL EVERY DAY   folic acid (FOLVITE) 1 MG tablet TAKE 1 TABLET BY MOUTH EVERY DAY   levothyroxine (SYNTHROID) 75 MCG tablet TAKE 1 TABLET BY MOUTH EVERY DAY BEFORE BREAKFAST   metoprolol tartrate (LOPRESSOR) 50 MG tablet TAKE 1 TABLET BY MOUTH TWICE A DAY   Multiple Vitamins-Minerals (ZINC PO) Take 1 tablet by mouth daily.   phenazopyridine (PYRIDIUM) 200 MG tablet Take 1 tablet (200 mg total) by mouth 3 (three) times daily as needed for pain.   potassium chloride (MICRO-K) 10 MEQ CR capsule TAKE 4 CAPSULES BY MOUTH EVERY DAY   Vitamin D, Ergocalciferol, (DRISDOL) 1.25 MG (50000 UNIT) CAPS capsule TAKE 1 CAPSULE BY MOUTH ONE TIME PER WEEK   Zinc Sulfate (ZINC-220 PO) Take by mouth.   No facility-administered encounter medications on file as of 03/05/2022.     Recent Office Vitals: BP Readings from Last 3 Encounters:  12/16/21 134/84  12/10/21 120/84  08/05/21 114/72   Pulse  Readings from Last 3 Encounters:  12/16/21 68  12/10/21 68  08/05/21 60    Wt Readings from Last 3 Encounters:  12/16/21 175 lb (79.4 kg)  12/10/21 172 lb (78 kg)  08/05/21 176 lb (79.8 kg)     Kidney Function Lab Results  Component Value Date/Time   CREATININE 1.60 (H) 12/10/2021 08:33 AM   CREATININE 1.55 (H) 05/07/2021 10:00 AM   GFRNONAA 39 (L) 05/07/2020 11:40 AM   GFRAA 45 (L) 05/07/2020 11:40 AM       Latest Ref Rng & Units 12/10/2021    8:33 AM 05/07/2021   10:00 AM 04/29/2021    8:27 AM  BMP  Glucose 70 - 99 mg/dL 97  90  104   BUN 8 - 27 mg/dL '28  26  25   '$ Creatinine 0.57 - 1.00 mg/dL 1.60  1.55  1.50   BUN/Creat Ratio 12 - '28 18  17  17   '$ Sodium 134 - 144 mmol/L 139  139  140   Potassium 3.5 - 5.2 mmol/L 4.7  4.8  4.4   Chloride 96 - 106 mmol/L 105  104  107   CO2 20 - 29 mmol/L '19  21  21   '$ Calcium 8.7 - 10.3 mg/dL 9.0  9.1  8.9      /Current antihypertensive regimen:  Metoprolol Tartrate '50mg'$  twice daily  Chlorthalidone 25 mg daily  Clonidine 0.3 mg take 1/2 tablet in the morning and 1 tablet in the evening  Patient verbally confirms she is taking the above medications as directed. Yes  Do you receive your medications through PAP? No  If Yes, what is the status? Last received?  How often are you checking your Blood Pressure? daily  she checks her blood pressure in the middle of the day before taking her medication.  Current home BP readings: 03/10/22 130/80- Pt did not have any other readings to give me today. She did not write them down,   Wrist or arm cuff: Arm  Caffeine intake:2 cups of coffee  Salt intake:None   Any readings above 180/120? No  What recent interventions/DTPs have been made by any provider to improve Blood Pressure control since last CPP Visit: No changes   Any recent hospitalizations or ED visits since last visit with CPP? No  What diet changes have been made to improve Blood Pressure Control?  No changes   What exercise is  being done to improve your Blood Pressure Control?  Walks daily   Adherence Review: Is the patient currently on ACE/ARB medication? No Does the patient have >5 day gap between last estimated fill dates? CPP to review  Care Gaps: Last annual wellness visit? 12/27/20  Star Rating Drugs:  Medication:  Last Fill: Day Supply None noted   Note: Pt declined to schedule follow up with CPP. She stated she discusses everything with Dr. Tobie Poet and after I advised we need a follow to continue getting CCM services she stated she feels she doesn't need it. Please advise.    Elray Mcgregor, [place] Pharmacist Assistant  [phone]

## 2022-04-14 ENCOUNTER — Telehealth (INDEPENDENT_AMBULATORY_CARE_PROVIDER_SITE_OTHER): Payer: Medicare HMO | Admitting: Nurse Practitioner

## 2022-04-14 ENCOUNTER — Encounter: Payer: Self-pay | Admitting: Nurse Practitioner

## 2022-04-14 VITALS — Ht 66.0 in | Wt 175.0 lb

## 2022-04-14 DIAGNOSIS — U071 COVID-19: Secondary | ICD-10-CM | POA: Diagnosis not present

## 2022-04-14 MED ORDER — PROMETHAZINE-DM 6.25-15 MG/5ML PO SYRP: 5.0000 mL | ORAL_SOLUTION | Freq: Four times a day (QID) | ORAL | 0 refills | Status: DC | PRN

## 2022-04-14 MED ORDER — MOLNUPIRAVIR EUA 200MG CAPSULE: 4.0000 | ORAL_CAPSULE | Freq: Two times a day (BID) | ORAL | 0 refills | Status: AC

## 2022-04-14 NOTE — Progress Notes (Signed)
Virtual Visit via Telephone Note   This visit type was conducted due to national recommendations for restrictions regarding the COVID-19 Pandemic (e.g. social distancing) in an effort to limit this patient's exposure and mitigate transmission in our community.  Due to her co-morbid illnesses, this patient is at least at moderate risk for complications without adequate follow up.  This format is felt to be most appropriate for this patient at this time.  The patient did not have access to video technology/had technical difficulties with video requiring transitioning to audio format only (telephone).  All issues noted in this document were discussed and addressed.  No physical exam could be performed with this format.  Patient verbally consented to a telehealth visit.   Date:  04/14/2022   ID:  Cynthia Gardner, DOB December 01, 1945, MRN [id number]  Patient Location: Home Provider Location: Office/Clinic  PCP:  Rochel Brome, MD   Evaluation Performed:  Follow-Up Visit  Chief Complaint:  [id number]  History of Present Illness:    Cynthia Gardner is a 77 y.o. female with Upper respiratory symptoms She complains of sinus congestion, headache, post nasal drip, and sore throat with chills. Denies fever, dyspnea, or vomiting. Onset of symptoms was yesterday and staying constant.Positive home COVID-19 test this morning. Treatment has included Coricidan Flu and Cold, Tylenol, and Flonase .  Past history is significant for pneumonia and hx of covid 3 years ago . Patient is non-smoker.    The patient does have symptoms concerning for COVID-19 infection (fever, chills, cough, or new shortness of breath).    Past Medical History:  Diagnosis Date   Antiphospholipid antibody syndrome (HCC)    CVA (cerebral vascular accident) ([place]) 04/06/2019   History of COVID-19    HTN (hypertension) 04/06/2019   Hypothyroidism 04/06/2019   Leukemia ([place])     Past Surgical History:  Procedure Laterality Date   CATARACT EXTRACTION  Right    COLON RESECTION  2008   ischemic bowel    Family History  Problem Relation Age of Onset   Parkinson's disease Father    Cirrhosis Brother        from agent orange   Hypertension Other    Diabetes Other     Social History   Socioeconomic History   Marital status: Married    Spouse name: Not on file   Number of children: 2   Years of education: Not on file   Highest education level: Not on file  Occupational History   Not on file  Tobacco Use   Smoking status: Former   Smokeless tobacco: Never  Vaping Use   Vaping Use: Never used  Substance and Sexual Activity   Alcohol use: Not Currently   Drug use: Never   Sexual activity: Not Currently    Birth control/protection: None  Other Topics Concern   Not on file  Social History Narrative   Not on file   Social Determinants of Health   Financial Resource Strain: Low Risk  (12/27/2020)   Overall Financial Resource Strain (CARDIA)    Difficulty of Paying Living Expenses: Not hard at all  Food Insecurity: No Food Insecurity (12/27/2020)   Hunger Vital Sign    Worried About Running Out of Food in the Last Year: Never true    Ran Out of Food in the Last Year: Never true  Transportation Needs: No Transportation Needs (12/27/2020)   PRAPARE - Hydrologist (Medical): No    Lack of Transportation (Non-Medical):  No  Physical Activity: Inactive (12/27/2020)   Exercise Vital Sign    Days of Exercise per Week: 0 days    Minutes of Exercise per Session: 0 min  Stress: No Stress Concern Present (12/27/2020)   [place]    Feeling of Stress : Not at all  Social Connections: [place] (12/27/2020)   Social Connection and Isolation Panel [NHANES]    Frequency of Communication with Friends and Family: More than three times a week    Frequency of Social Gatherings with Friends and Family: Three times a week    Attends  Religious Services: More than 4 times per year    Active Member of Clubs or Organizations: Yes    Attends Archivist Meetings: Never    Marital Status: Married  Human resources officer Violence: Not At Risk (12/27/2020)   Humiliation, Afraid, Rape, and Kick questionnaire    Fear of Current or Ex-Partner: No    Emotionally Abused: No    Physically Abused: No    Sexually Abused: No    Outpatient Medications Prior to Visit  Medication Sig Dispense Refill   cetirizine (ZYRTEC) 10 MG tablet TAKE 1 TABLET BY MOUTH EVERY DAY 90 tablet 3   chlorthalidone (HYGROTON) 25 MG tablet TAKE 1 TABLET BY MOUTH EVERY DAY 90 tablet 1   Cholecalciferol (VITAMIN D3 PO) Take 1 tablet by mouth daily.     cloNIDine (CATAPRES) 0.3 MG tablet TAKE 1 TABLET BY MOUTH TWICE A DAY 180 tablet 1   CVS CHEWABLE C WITH ROSE HIPS 500 MG CHEW TAKE 1 TABLET BY MOUTH EVERY DAY 100 tablet 1   diclofenac Sodium (VOLTAREN) 1 % GEL Apply four times a day to hip. 1000 g 1   diphenoxylate-atropine (LOMOTIL) 2.5-0.025 MG tablet Take 1 tablet by mouth 4 (four) times daily as needed for diarrhea or loose stools. 60 tablet 2   ELIQUIS 5 MG TABS tablet TAKE 1 TABLET BY MOUTH TWICE A DAY 60 tablet 5   fluticasone (FLONASE) 50 MCG/ACT nasal spray SPRAY 2 SPRAYS INTO EACH NOSTRIL EVERY DAY 48 mL 5   folic acid (FOLVITE) 1 MG tablet TAKE 1 TABLET BY MOUTH EVERY DAY 90 tablet 1   levothyroxine (SYNTHROID) 75 MCG tablet TAKE 1 TABLET BY MOUTH EVERY DAY BEFORE BREAKFAST 90 tablet 1   metoprolol tartrate (LOPRESSOR) 50 MG tablet TAKE 1 TABLET BY MOUTH TWICE A DAY 180 tablet 3   Multiple Vitamins-Minerals (ZINC PO) Take 1 tablet by mouth daily.     phenazopyridine (PYRIDIUM) 200 MG tablet Take 1 tablet (200 mg total) by mouth 3 (three) times daily as needed for pain. 12 tablet 0   potassium chloride (MICRO-K) 10 MEQ CR capsule TAKE 4 CAPSULES BY MOUTH EVERY DAY 360 capsule 1   Vitamin D, Ergocalciferol, (DRISDOL) 1.25 MG (50000 UNIT) CAPS  capsule TAKE 1 CAPSULE BY MOUTH ONE TIME PER WEEK 12 capsule 0   Zinc Sulfate (ZINC-220 PO) Take by mouth.     No facility-administered medications prior to visit.    Allergies:   Patient has no known allergies.   Social History   Tobacco Use   Smoking status: Former   Smokeless tobacco: Never  Scientific laboratory technician Use: Never used  Substance Use Topics   Alcohol use: Not Currently   Drug use: Never     ROS  See pertinent positives and negatives per HPI.  Labs/Other Tests and Data Reviewed:  Recent Labs: 12/10/2021: ALT 9; BUN 28; Creatinine, Ser 1.60; Hemoglobin 12.0; Platelets 316; Potassium 4.7; Sodium 139; TSH 1.540   Recent Lipid Panel Lab Results  Component Value Date/Time   CHOL 152 12/10/2021 08:33 AM   TRIG 148 12/10/2021 08:33 AM   HDL 34 (L) 12/10/2021 08:33 AM   CHOLHDL 4.5 (H) 12/10/2021 08:33 AM   LDLCALC 92 12/10/2021 08:33 AM    Wt Readings from Last 3 Encounters:  04/14/22 175 lb (79.4 kg)  12/16/21 175 lb (79.4 kg)  12/10/21 172 lb (78 kg)     Objective:    Vital Signs:  Ht '5\' 6"'$  (1.676 m)   Wt 175 lb (79.4 kg)   BMI 28.25 kg/m    Physical Exam No physical exam due to telemedicine visit  ASSESSMENT & PLAN:     1. COVID-19 - molnupiravir EUA (LAGEVRIO) 200 mg CAPS capsule; Take 4 capsules (800 mg total) by mouth 2 (two) times daily for 5 days.  Dispense: 40 capsule; Refill: 0 - promethazine-dextromethorphan (PROMETHAZINE-DM) 6.25-15 MG/5ML syrup; Take 5 mLs by mouth 4 (four) times daily as needed.  Dispense: 118 mL; Refill: 0    Rest and push fluids Continue Flonase nasal spray Continue Coricidan Flu and Cold as directed Continue Tylenol as directed Follow-up as needed      COVID-19 Education: The signs and symptoms of COVID-19 were discussed with the patient and how to seek care for testing (follow up with PCP or arrange E-visit). The importance of social distancing was discussed today.   I spent 10 minutes dedicated to the care  of this patient on the date of this encounter to include face-to-face time with the patient, as well as: EMR review and prescription medication management.   Follow Up:  In Person prn  Signed,  Jerrell Belfast, DNP  04/14/2022 10:44 AM    [place]

## 2022-04-16 ENCOUNTER — Ambulatory Visit: Payer: Medicare HMO | Admitting: Family Medicine

## 2022-04-24 ENCOUNTER — Other Ambulatory Visit: Payer: Self-pay | Admitting: Family Medicine

## 2022-04-30 ENCOUNTER — Other Ambulatory Visit: Payer: Self-pay | Admitting: Family Medicine

## 2022-05-15 ENCOUNTER — Telehealth: Payer: Self-pay

## 2022-05-15 NOTE — Progress Notes (Signed)
Left message for patient to call back to schedule Medicare Annual Wellness Visit   Last AWV  12/27/20  Please schedule with Rhae Hammock, LPN if patient calls the office back.    62 Minutes appointment   Any questions, please call me at [phone]

## 2022-05-20 DIAGNOSIS — Z743 Need for continuous supervision: Secondary | ICD-10-CM | POA: Diagnosis not present

## 2022-05-20 DIAGNOSIS — M542 Cervicalgia: Secondary | ICD-10-CM | POA: Diagnosis not present

## 2022-05-20 DIAGNOSIS — M79642 Pain in left hand: Secondary | ICD-10-CM | POA: Diagnosis not present

## 2022-05-20 DIAGNOSIS — S20219A Contusion of unspecified front wall of thorax, initial encounter: Secondary | ICD-10-CM | POA: Diagnosis not present

## 2022-05-20 DIAGNOSIS — Z041 Encounter for examination and observation following transport accident: Secondary | ICD-10-CM | POA: Diagnosis not present

## 2022-05-20 DIAGNOSIS — M25519 Pain in unspecified shoulder: Secondary | ICD-10-CM | POA: Diagnosis not present

## 2022-05-20 DIAGNOSIS — Z9889 Other specified postprocedural states: Secondary | ICD-10-CM | POA: Diagnosis not present

## 2022-05-20 DIAGNOSIS — I700 Atherosclerosis of aorta: Secondary | ICD-10-CM | POA: Diagnosis not present

## 2022-05-20 DIAGNOSIS — R079 Chest pain, unspecified: Secondary | ICD-10-CM | POA: Diagnosis not present

## 2022-05-22 ENCOUNTER — Telehealth: Payer: Self-pay

## 2022-05-22 NOTE — Transitions of Care (Post Inpatient/ED Visit) (Signed)
   05/22/2022  Name: KEJUANA CONYERS MRN: [id number] DOB: 09/10/1945  Today's TOC FU Call Status: Today's TOC FU Call Status:: Successful TOC FU Call Competed TOC FU Call Complete Date: 05/22/22  Transition Care Management Follow-up Telephone Call Date of Discharge: 05/20/22 Discharge Facility: Other ([place]) Name of Other (Non-[facility]) Discharge Facility: [facility] Type of Discharge: Emergency Department Reason for ED Visit: Other: ([facility]usion [facility] Chest from [facility]) How have you been since you were released from the hospital?: Worse (Increase in soreness today, managed by medication) Any questions or concerns?: No No acute findings on Chest XR, or CT of head & cervical spine  Items Reviewed: Did you receive and understand the discharge instructions provided?: Yes Medications obtained and verified?: Yes (Medications Reviewed) Any new allergies since your discharge?: No Dietary orders reviewed?: NA Do you have support at home?: Yes People in Home: spouse Name of Support/Comfort Primary Source: [facility] and Equipment/Supplies: [place] Ordered?: NA Any new equipment or medical supplies ordered?: NA  Functional Questionnaire: Do you need assistance with bathing/showering or dressing?: No Do you need assistance with meal preparation?: No Do you need assistance with eating?: No Do you have difficulty maintaining continence: No Do you need assistance with getting out of bed/getting out of a chair/moving?: No Do you have difficulty managing or taking your medications?: No  Folllow up appointments reviewed: PCP Follow-up appointment confirmed?: NA Specialist Hospital Follow-up appointment confirmed?: NA Do you need transportation to your follow-up appointment?: No Do you understand care options if your condition(s) worsen?: Yes-patient verbalized understanding    SIGNATURE: Shelle Iron, LPN

## 2022-05-27 ENCOUNTER — Ambulatory Visit (INDEPENDENT_AMBULATORY_CARE_PROVIDER_SITE_OTHER): Payer: Medicare HMO

## 2022-05-27 DIAGNOSIS — Z0000 Encounter for general adult medical examination without abnormal findings: Secondary | ICD-10-CM | POA: Diagnosis not present
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
## 2022-05-27 NOTE — Patient Instructions (Signed)
Cynthia Gardner , Thank you for taking time to come for your Medicare Wellness Visit. I appreciate your ongoing commitment to your health goals. Please review the following plan we discussed and let me know if I can assist you in the future.   Screening recommendations/referrals: Recommended yearly ophthalmology/optometry visit for glaucoma screening and checkup Recommended yearly dental visit for hygiene and checkup  Vaccinations: Influenza vaccine: Due Fall 2024 Pneumococcal vaccine: Complete Tdap vaccine: Due - you can get this at the pharmacy Shingles vaccine: Due - you can get this at the pharmacy     Preventive Care 65 Years and Older, Female   Preventive care refers to lifestyle choices and visits with your health care provider that can promote health and wellness.  What does preventive care include? A yearly physical exam. This is also called an annual well check. Dental exams once or twice a year. Routine eye exams. Ask your health care provider how often you should have your eyes checked. Personal lifestyle choices, including: Daily care of your teeth and gums. Regular physical activity. Eating a healthy diet. Avoiding tobacco and drug use. Limiting alcohol use. Practicing safe sex. Taking low-dose aspirin every day. Taking vitamin and mineral supplements as recommended by your health care provider.  What happens during an annual well check? The services and screenings done by your health care provider during your annual well check will depend on your age, overall health, lifestyle risk factors, and family history of disease.  Counseling Your health care provider may ask you questions about your: Alcohol use. Tobacco use. Drug use. Emotional well-being. Home and relationship well-being. Sexual activity. Eating habits. History of falls. Memory and ability to understand (cognition). Work and work Statistician. Reproductive health.  Screening You may have the  following tests or measurements: Height, weight, and BMI. Blood pressure. Lipid and cholesterol levels. These may be checked every 5 years, or more frequently if you are over 70 years old. Skin check. Lung cancer screening. You may have this screening every year starting at age 86 if you have a 30-pack-year history of smoking and currently smoke or have quit within the past 15 years. Fecal occult blood test (FOBT) of the stool. You may have this test every year starting at age 15. Flexible sigmoidoscopy or colonoscopy. You may have a sigmoidoscopy every 5 years or a colonoscopy every 10 years starting at age 32. Hepatitis C blood test. Hepatitis B blood test. Sexually transmitted disease (STD) testing. Diabetes screening. This is done by checking your blood sugar (glucose) after you have not eaten for a while (fasting). You may have this done every 1-3 years. Bone density scan. This is done to screen for osteoporosis. You may have this done starting at age 71. Mammogram. This may be done every 1-2 years. Talk to your health care provider about how often you should have regular mammograms. Talk with your health care provider about your test results, treatment options, and if necessary, the need for more tests.  Vaccines Your health care provider may recommend certain vaccines, such as: Influenza vaccine. This is recommended every year. Tetanus, diphtheria, and acellular pertussis (Tdap, Td) vaccine. You may need a Td booster every 10 years. Zoster vaccine. You may need this after age 30. Pneumococcal 13-valent conjugate (PCV13) vaccine. One dose is recommended after age 80. Pneumococcal polysaccharide (PPSV23) vaccine. One dose is recommended after age 82. Talk to your health care provider about which screenings and vaccines you need and how often you need them.  This information is not intended to replace advice given to you by your health care provider. Make sure you discuss any questions  you have with your health care provider. Document Released: 04/13/2015 Document Revised: 12/05/2015 Document Reviewed: 01/16/2015 Elsevier Interactive Patient Education  2017 [place] Prevention in the Home  Falls can cause injuries. They can happen to people of all ages. There are many things you can do to make your home safe and to help prevent falls.  What can I do on the outside of my home? Regularly fix the edges of walkways and driveways and fix any cracks. Remove anything that might make you trip as you walk through a door, such as a raised step or threshold. Trim any bushes or trees on the path to your home. Use bright outdoor lighting. Clear any walking paths of anything that might make someone trip, such as rocks or tools. Regularly check to see if handrails are loose or broken. Make sure that both sides of any steps have handrails. Any raised decks and porches should have guardrails on the edges. Have any leaves, snow, or ice cleared regularly. Use sand or salt on walking paths during winter. Clean up any spills in your garage right away. This includes oil or grease spills.  What can I do in the bathroom? Use night lights. Install grab bars by the toilet and in the tub and shower. Do not use towel bars as grab bars. Use non-skid mats or decals in the tub or shower. If you need to sit down in the shower, use a plastic, non-slip stool. Keep the floor dry. Clean up any water that spills on the floor as soon as it happens. Remove soap buildup in the tub or shower regularly. Attach bath mats securely with double-sided non-slip rug tape. Do not have throw rugs and other things on the floor that can make you trip.  What can I do in the bedroom? Use night lights. Make sure that you have a light by your bed that is easy to reach. Do not use any sheets or blankets that are too big for your bed. They should not hang down onto the floor. Have a firm chair that has side  arms. You can use this for support while you get dressed. Do not have throw rugs and other things on the floor that can make you trip.  What can I do in the kitchen? Clean up any spills right away. Avoid walking on wet floors. Keep items that you use a lot in easy-to-reach places. If you need to reach something above you, use a strong step stool that has a grab bar. Keep electrical cords out of the way. Do not use floor polish or wax that makes floors slippery. If you must use wax, use non-skid floor wax. Do not have throw rugs and other things on the floor that can make you trip.  What can I do with my stairs? Do not leave any items on the stairs. Make sure that there are handrails on both sides of the stairs and use them. Fix handrails that are broken or loose. Make sure that handrails are as long as the stairways. Check any carpeting to make sure that it is firmly attached to the stairs. Fix any carpet that is loose or worn. Avoid having throw rugs at the top or bottom of the stairs. If you do have throw rugs, attach them to the floor with carpet tape. Make sure that  you have a light switch at the top of the stairs and the bottom of the stairs. If you do not have them, ask someone to add them for you.  What else can I do to help prevent falls? Wear shoes that: Do not have high heels. Have rubber bottoms. Are comfortable and fit you well. Are closed at the toe. Do not wear sandals. If you use a stepladder: Make sure that it is fully opened. Do not climb a closed stepladder. Make sure that both sides of the stepladder are locked into place. Ask someone to hold it for you, if possible. Clearly mark and make sure that you can see: Any grab bars or handrails. First and last steps. Where the edge of each step is. Use tools that help you move around (mobility aids) if they are needed. These include: Canes. Walkers. Scooters. Crutches. Turn on the lights when you go into a dark area.  Replace any light bulbs as soon as they burn out. Set up your furniture so you have a clear path. Avoid moving your furniture around. If any of your floors are uneven, fix them. If there are any pets around you, be aware of where they are. Review your medicines with your doctor. Some medicines can make you feel dizzy. This can increase your chance of falling. Ask your doctor what other things that you can do to help prevent falls.  This information is not intended to replace advice given to you by your health care provider. Make sure you discuss any questions you have with your health care provider. Document Released: 01/11/2009 Document Revised: 08/23/2015 Document Reviewed: 04/21/2014 Elsevier Interactive Patient Education  2017 [employer].

## 2022-05-28 ENCOUNTER — Ambulatory Visit: Payer: Medicare HMO | Admitting: Family Medicine

## 2022-05-28 ENCOUNTER — Telehealth: Payer: Self-pay

## 2022-05-28 ENCOUNTER — Encounter: Payer: Self-pay | Admitting: Family Medicine

## 2022-05-28 VITALS — BP 136/80 | HR 70 | Temp 97.4°F | Ht 66.0 in | Wt 168.0 lb

## 2022-05-28 DIAGNOSIS — J018 Other acute sinusitis: Secondary | ICD-10-CM

## 2022-05-28 DIAGNOSIS — S20212A Contusion of left front wall of thorax, initial encounter: Secondary | ICD-10-CM | POA: Diagnosis not present

## 2022-05-28 DIAGNOSIS — S60222A Contusion of left hand, initial encounter: Secondary | ICD-10-CM

## 2022-05-28 MED ORDER — AZITHROMYCIN 250 MG PO TABS: ORAL_TABLET | ORAL | 0 refills | Status: AC

## 2022-05-28 NOTE — Telephone Encounter (Signed)
     Patient  visit on 2/20  at [facility]   Have you been able to follow up with your primary care physician? Yes   The patient was or was not able to obtain any needed medicine or equipment. Yes   Are there diet recommendations that you are having difficulty following? Na   Patient expresses understanding of discharge instructions and education provided has no other needs at this time.  Yes     Cynthia Gardner Pop Health Care Guide, [place] [phone] [address], [place], [place] [address] Phone: [phone] Email: [email or web address]    
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
## 2022-06-02 ENCOUNTER — Telehealth: Payer: Self-pay
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
## 2022-06-03 NOTE — Telephone Encounter (Signed)
Patient stated she has a "pulling" pain when she goes to lay down or stand up but goes away. States pain is improving and feels better when she take a muscle relaxant. Patient aware that if pain worsens or does not improve to contact our office. May also take tylenol with muscle relaxant per Dr. Tobie Poet.

## 2022-06-07 DIAGNOSIS — S20212A Contusion of left front wall of thorax, initial encounter: Secondary | ICD-10-CM | POA: Insufficient documentation

## 2022-06-07 DIAGNOSIS — S60222A Contusion of left hand, initial encounter: Secondary | ICD-10-CM | POA: Insufficient documentation

## 2022-06-07 NOTE — Assessment & Plan Note (Signed)
Ice 

## 2022-06-07 NOTE — Assessment & Plan Note (Addendum)
Prescription: zpack.  Incidental finding.

## 2022-06-07 NOTE — Assessment & Plan Note (Signed)
Continue methocarbamol.  Continue hydrocodone sparingly.  Ice.

## 2022-06-24 ENCOUNTER — Other Ambulatory Visit: Payer: Self-pay | Admitting: Family Medicine

## 2022-06-30 ENCOUNTER — Other Ambulatory Visit: Payer: Self-pay | Admitting: Family Medicine

## 2022-06-30 ENCOUNTER — Telehealth: Payer: Self-pay

## 2022-06-30 DIAGNOSIS — R0781 Pleurodynia: Secondary | ICD-10-CM

## 2022-06-30 DIAGNOSIS — R0789 Other chest pain: Secondary | ICD-10-CM
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
## 2022-07-01 DIAGNOSIS — R0781 Pleurodynia: Secondary | ICD-10-CM | POA: Diagnosis not present

## 2022-07-04 ENCOUNTER — Other Ambulatory Visit: Payer: Self-pay

## 2022-07-04 DIAGNOSIS — R0789 Other chest pain: Secondary | ICD-10-CM

## 2022-07-04 DIAGNOSIS — R0781 Pleurodynia: Secondary | ICD-10-CM

## 2022-07-07 ENCOUNTER — Telehealth: Payer: Self-pay | Admitting: Family Medicine

## 2022-07-07 NOTE — Telephone Encounter (Signed)
PT CALLED WANTING TO MAKE A NURSE APPT TO GET EAR CLEANED BEFORE GETTING HER HEARING AIDS. I TOLD THE PT I CAN ADD HER TO THE NURSE SCHEDULE BUT IT MAY TURN INTO A DOCTORS APPT IF EAR ARE NOT ABLE TO BE CLEANED. PT DIDN'T UNDERSTAND WHAT I WAS SAYING AND SHE STATED IT THAT WAS RIDICULOUS AND SHE WILL TALK TO DR. COX ABOUT THIS. I TOLD HER I WOULD GET HER TO A NURSE AND SHE IF THEY COULD HELP HER.

## 2022-07-22 ENCOUNTER — Other Ambulatory Visit: Payer: Self-pay | Admitting: Family Medicine

## 2022-07-22 IMAGING — US US RENAL
1 series · 14 of 25 positions shown · non-contrast
Comparison: None.

CLINICAL DATA: Worsening renal function

EXAM:
RENAL / URINARY TRACT ULTRASOUND COMPLETE

[Series 1: us renal · 14 of 38 slices shown]
[im 1/38]
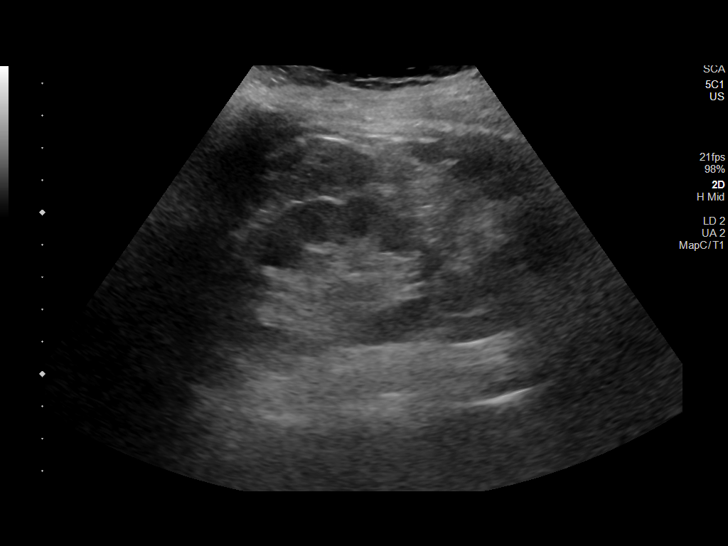
[im 4/38]
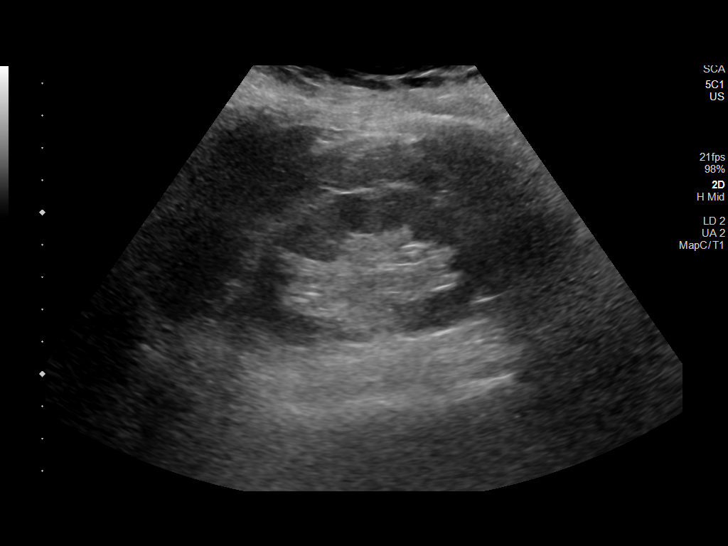
[im 7/38]
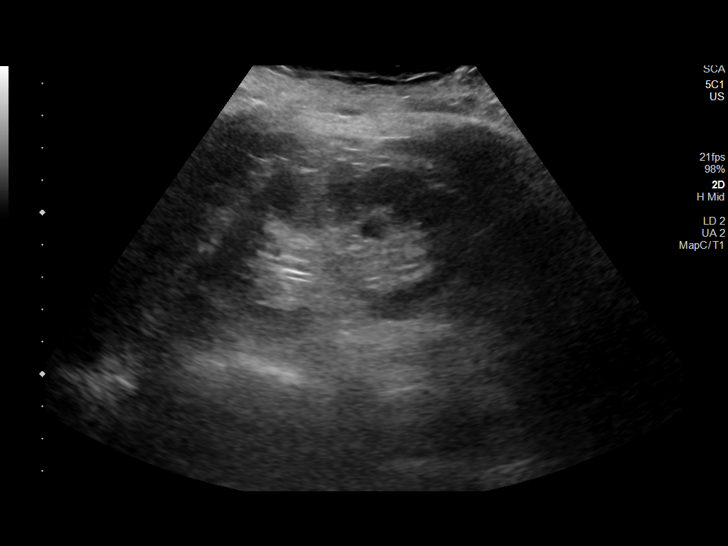
[im 10/38]
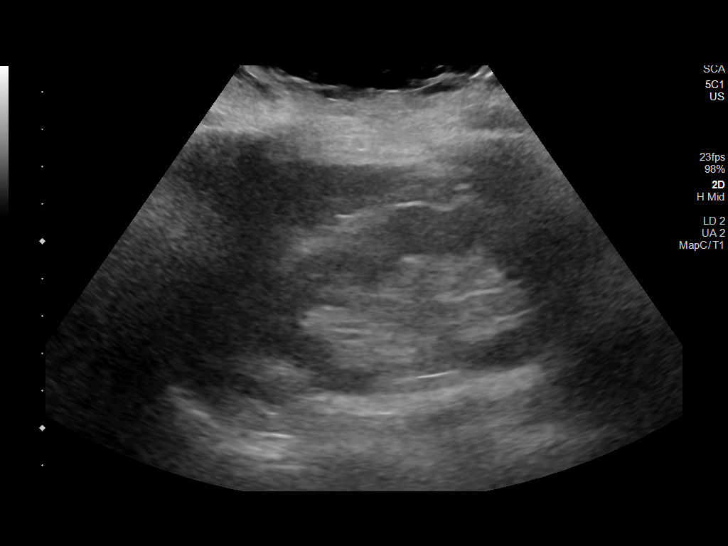
[im 13/38]
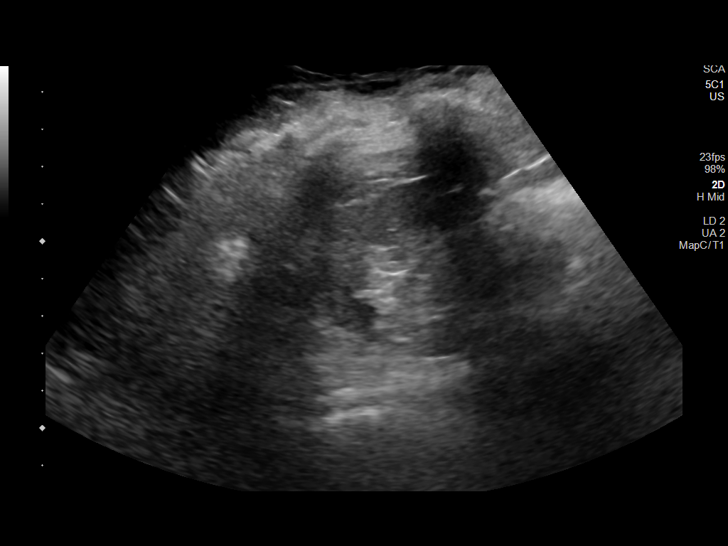
[im 14/38]
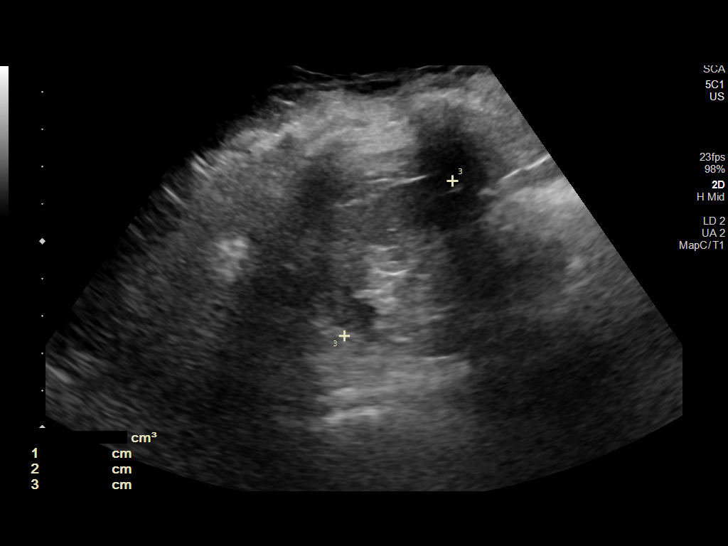
[im 17/38]
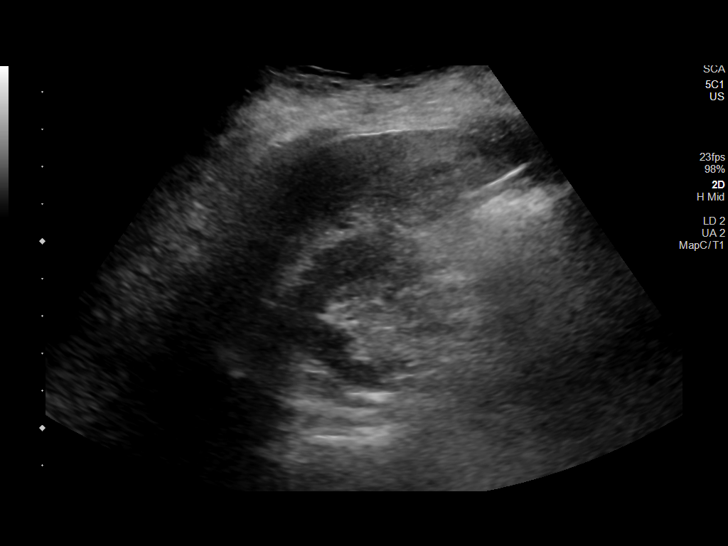
[im 21/38]
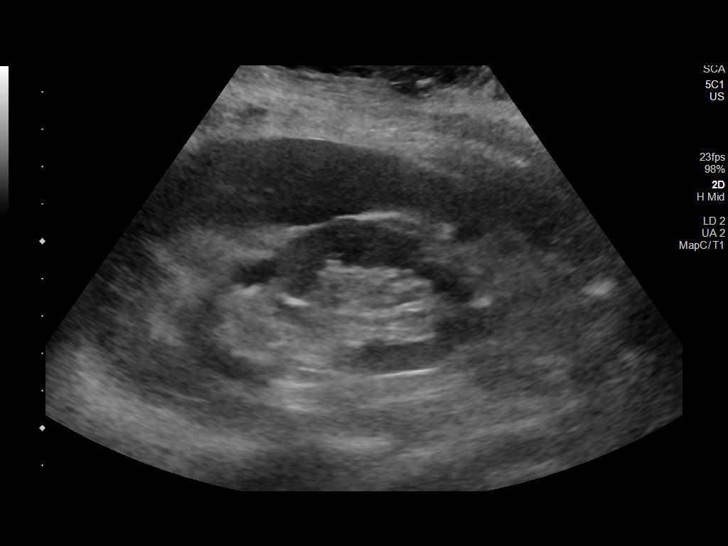
[im 24/38]
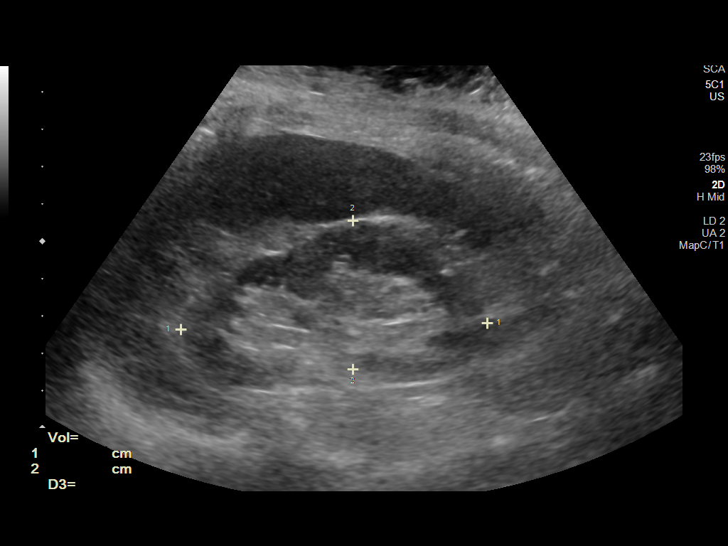
[im 25/38]
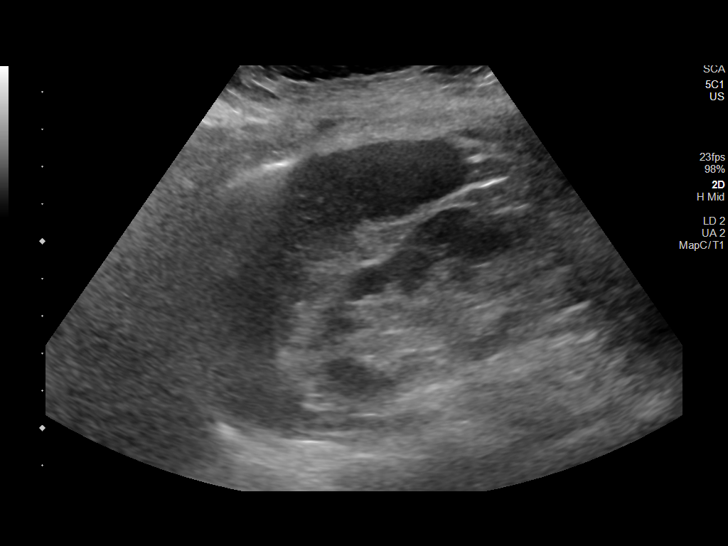
[im 28/38]
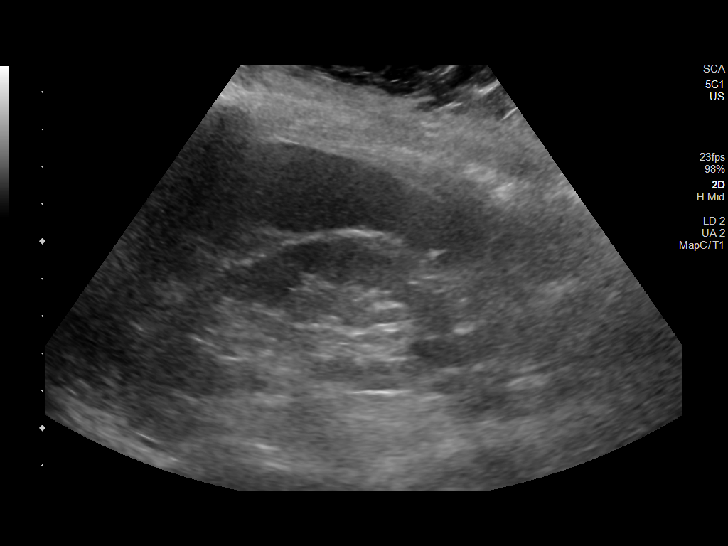
[im 31/38]
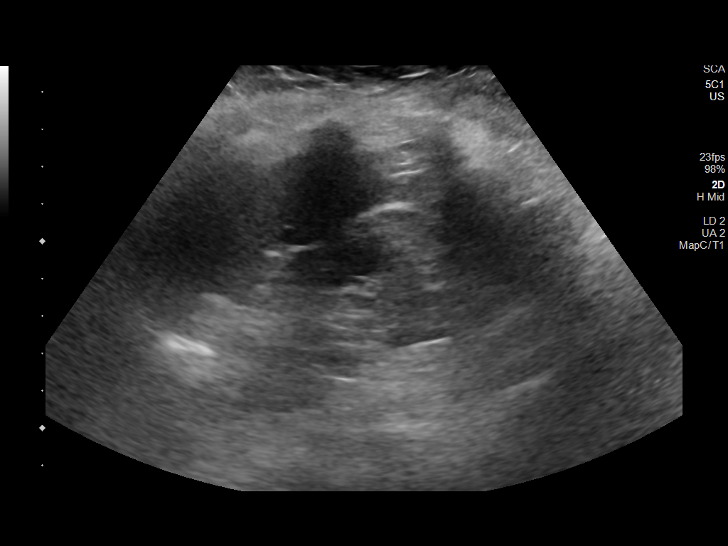
[im 34/38]
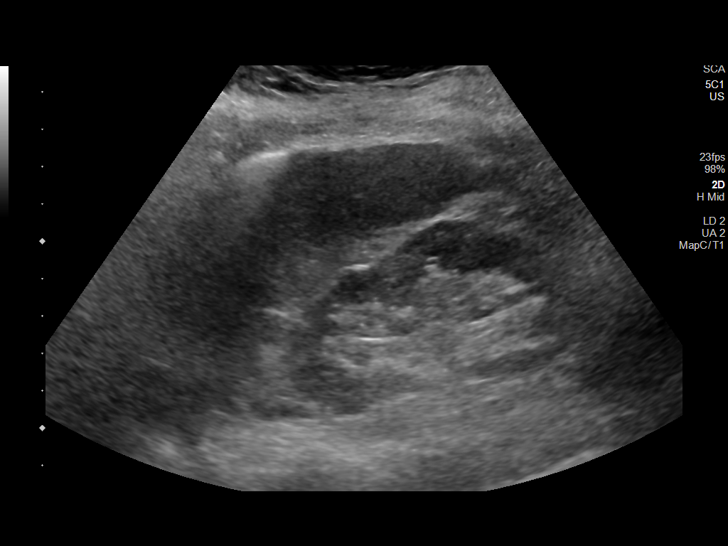
[im 38/38]
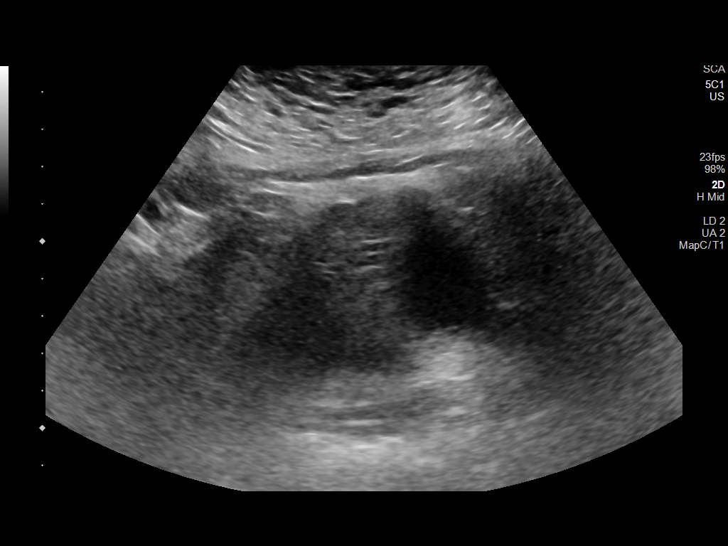

[14 of 25 positions shown; findings below may reference images not displayed]

FINDINGS: Right Kidney:

Renal measurements: 8.4 x 4.8 x 5.1 cm = volume: 106.1 mL. There is
a 4.8 mm nonobstructive stone in the right kidney. There is a 9 mm
cyst as well.

Left Kidney:

Renal measurements: 8.2 x 4.8 x 3.9 cm = volume: 66 mL. Echogenicity
within normal limits. No mass or hydronephrosis visualized.

Bladder:

The bladder is poorly distended limiting evaluation but grossly
unremarkable.

Other:

None.
IMPRESSION: 1. 4.8 mm nonobstructive stone in the right kidney.
2. 9 mm cyst in the right kidney.
3. The kidneys are smaller than normal, both measuring less than 9
cm as above.
4. The bladder is poorly evaluated due to lack of distention.

## 2022-07-26 ENCOUNTER — Other Ambulatory Visit: Payer: Self-pay | Admitting: Family Medicine

## 2022-08-23 ENCOUNTER — Other Ambulatory Visit: Payer: Self-pay | Admitting: Family Medicine

## 2022-08-26 ENCOUNTER — Other Ambulatory Visit: Payer: Self-pay | Admitting: Family Medicine

## 2022-08-26 ENCOUNTER — Telehealth: Payer: Self-pay
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
## 2022-09-02 ENCOUNTER — Ambulatory Visit: Payer: Medicare HMO | Admitting: Family Medicine

## 2022-09-02 ENCOUNTER — Encounter: Payer: Self-pay | Admitting: Family Medicine

## 2022-09-02 ENCOUNTER — Ambulatory Visit (INDEPENDENT_AMBULATORY_CARE_PROVIDER_SITE_OTHER): Payer: Medicare HMO | Admitting: Family Medicine

## 2022-09-02 VITALS — BP 140/72 | HR 68 | Temp 95.7°F | Resp 18 | Ht 66.0 in | Wt 168.2 lb

## 2022-09-02 DIAGNOSIS — M25561 Pain in right knee: Secondary | ICD-10-CM

## 2022-09-02 DIAGNOSIS — G8929 Other chronic pain: Secondary | ICD-10-CM | POA: Diagnosis not present
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
## 2022-09-02 NOTE — Patient Instructions (Signed)
Follow up in July as scheduled.

## 2022-09-02 NOTE — Assessment & Plan Note (Signed)
Nodule consistent with calcification.  Reassurance given.  NO further treatment required.

## 2022-09-08 ENCOUNTER — Ambulatory Visit: Payer: Medicare HMO | Admitting: Family Medicine

## 2022-09-25 ENCOUNTER — Telehealth: Payer: Self-pay | Admitting: Family Medicine

## 2022-09-26 NOTE — Telephone Encounter (Signed)
error 

## 2022-09-27 NOTE — Assessment & Plan Note (Signed)
Intolerant to statins.  Is recommend low-fat diet. 

## 2022-09-27 NOTE — Assessment & Plan Note (Signed)
Previously well controlled Continue Synthroid at current dose  Recheck TSH and adjust Synthroid as indicated   

## 2022-09-27 NOTE — Assessment & Plan Note (Signed)
Well controlled.  No changes to medicines.  Chlorthalidone 25 mg everyday, clonidine 0.3 mg daily, Metoprolol 50 mg daily. Continue to work on eating a healthy diet and exercise.  Labs drawn today.

## 2022-09-27 NOTE — Assessment & Plan Note (Signed)
check vitamin D level. 

## 2022-09-27 NOTE — Progress Notes (Unsigned)
Subjective:  Patient ID: Cynthia Gardner, female    DOB: 25-Jun-1945  Age: 77 y.o. MRN: [id number]  Chief Complaint  Patient presents with   Medical Management of Chronic Issues    HPI Hypothyroidism: Levothyroxine 75 mcg daily.  Hypertension: Current medications: Chlorthalidone 25 mg everyday, clonidine 0.3 mg twice daily, Metoprolol 50 mg daily.  Antiphospholipid syndrome/paroxysmal atrial fib:  Eliquis 5 mg twice a day.  CLL: sees oncology. Follows up yearly.    Diet: healthy Exercise:  no     09/29/2022    7:34 AM 09/02/2022    1:23 PM 05/27/2022    2:11 PM 12/27/2020   10:30 AM 12/25/2020    7:40 AM  Depression screen PHQ 2/9  Decreased Interest 0 0 0 0 0  Down, Depressed, Hopeless 0 0 0 0 0  PHQ - 2 Score 0 0 0 0 0  Altered sleeping 0      Tired, decreased energy 0      Change in appetite 0      Feeling bad or failure about yourself  0      Trouble concentrating 0      Moving slowly or fidgety/restless 0      Suicidal thoughts 0      PHQ-9 Score 0      Difficult doing work/chores Not difficult at all            09/29/2022    7:34 AM  Fall Risk   Falls in the past year? 0  Number falls in past yr: 0  Injury with Fall? 0  Risk for fall due to : No Fall Risks  Follow up Falls evaluation completed;Falls prevention discussed    Patient Care Team: Blane Ohara, MD as PCP - General (Family Medicine) Drue Second, MD as Referring Physician (Oncology)   Review of Systems  Constitutional:  Negative for chills, fatigue and fever.  HENT:  Negative for congestion, rhinorrhea and sore throat.   Respiratory:  Negative for cough and shortness of breath.   Cardiovascular:  Negative for chest pain.  Gastrointestinal:  Negative for abdominal pain, constipation, diarrhea, nausea and vomiting.  Genitourinary:  Negative for dysuria and urgency.  Musculoskeletal:  Positive for arthralgias (bilateral knee pain). Negative for gait problem and myalgias.  Neurological:  Negative for  dizziness, weakness, light-headedness and headaches.  Psychiatric/Behavioral:  Negative for dysphoric mood. The patient is not nervous/anxious.     Current Outpatient Medications on File Prior to Visit  Medication Sig Dispense Refill   cetirizine (ZYRTEC) 10 MG tablet TAKE 1 TABLET BY MOUTH EVERY DAY 90 tablet 3   chlorthalidone (HYGROTON) 25 MG tablet TAKE 1 TABLET BY MOUTH EVERY DAY 90 tablet 1   Cholecalciferol (VITAMIN D3 PO) Take 1 tablet by mouth daily.     cloNIDine (CATAPRES) 0.3 MG tablet TAKE 1 TABLET BY MOUTH TWICE A DAY 180 tablet 1   [facility] CHEWABLE C WITH ROSE HIPS 500 MG CHEW TAKE 1 TABLET BY MOUTH EVERY DAY 100 tablet 1   diclofenac Sodium (VOLTAREN) 1 % GEL Apply four times a day to hip. 1000 g 1   diphenoxylate-atropine (LOMOTIL) 2.5-0.025 MG tablet Take 1 tablet by mouth 4 (four) times daily as needed for diarrhea or loose stools. 60 tablet 2   ELIQUIS 5 MG TABS tablet TAKE 1 TABLET BY MOUTH TWICE A DAY 60 tablet 5   fluticasone (FLONASE) 50 MCG/ACT nasal spray SPRAY 2 SPRAYS INTO EACH NOSTRIL EVERY DAY 48 mL 5  folic acid (FOLVITE) 1 MG tablet TAKE 1 TABLET BY MOUTH EVERY DAY 90 tablet 1   levothyroxine (SYNTHROID) 75 MCG tablet TAKE 1 TABLET BY MOUTH EVERY DAY BEFORE BREAKFAST 90 tablet 1   metoprolol tartrate (LOPRESSOR) 50 MG tablet TAKE 1 TABLET BY MOUTH TWICE A DAY. PT NEEDS TO CALL TO RESCHEDULE FASTING APPT. 180 tablet 1   Multiple Vitamins-Minerals (ZINC PO) Take 1 tablet by mouth daily.     potassium chloride (MICRO-K) 10 MEQ CR capsule TAKE 4 CAPSULES BY MOUTH EVERY DAY 360 capsule 1   Vitamin D, Ergocalciferol, (DRISDOL) 1.25 MG (50000 UNIT) CAPS capsule TAKE 1 CAPSULE BY MOUTH ONE TIME PER WEEK 12 capsule 1   Zinc Sulfate (ZINC-220 PO) Take by mouth.     No current facility-administered medications on file prior to visit.   Past Medical History:  Diagnosis Date   Antiphospholipid antibody syndrome (HCC)    CVA (cerebral vascular accident) (HCC) 04/06/2019    History of COVID-19    HTN (hypertension) 04/06/2019   Hypothyroidism 04/06/2019   Leukemia (HCC)    Past Surgical History:  Procedure Laterality Date   APPENDECTOMY     CATARACT EXTRACTION Right    COLON RESECTION  2008   ischemic bowel   TONSILLECTOMY      Family History  Problem Relation Age of Onset   Parkinson's disease Father    Cirrhosis Brother        from agent orange   Hypertension Other    Diabetes Other    Social History   Socioeconomic History   Marital status: Married    Spouse name: Charles   Number of children: 2   Years of education: Not on file   Highest education level: Not on file  Occupational History   Not on file  Tobacco Use   Smoking status: Former   Smokeless tobacco: Never  Building services engineer Use: Never used  Substance and Sexual Activity   Alcohol use: Not Currently   Drug use: Never   Sexual activity: Not Currently    Birth control/protection: None  Other Topics Concern   Not on file  Social History Narrative   Not on file   Social Determinants of Health   Financial Resource Strain: Low Risk  (09/29/2022)   Overall Financial Resource Strain (CARDIA)    Difficulty of Paying Living Expenses: Not hard at all  Food Insecurity: No Food Insecurity (09/29/2022)   Hunger Vital Sign    Worried About Running Out of Food in the Last Year: Never true    Ran Out of Food in the Last Year: Never true  Transportation Needs: No Transportation Needs (09/29/2022)   PRAPARE - Administrator, Civil Service (Medical): No    Lack of Transportation (Non-Medical): No  Physical Activity: Insufficiently Active (05/27/2022)   Exercise Vital Sign    Days of Exercise per Week: 3 days    Minutes of Exercise per Session: 30 min  Stress: No Stress Concern Present (09/29/2022)   [employer] of Occupational Health - Occupational Stress Questionnaire    Feeling of Stress : Only a little  Social Connections: Socially Integrated (05/27/2022)   Social  Connection and Isolation Panel [NHANES]    Frequency of Communication with Friends and Family: More than three times a week    Frequency of Social Gatherings with Friends and Family: Three times a week    Attends Religious Services: More than 4 times per year  Active Member of Clubs or Organizations: Yes    Attends Banker Meetings: Never    Marital Status: Married    Objective:  BP 110/60   Pulse 64   Temp (!) 95.3 F (35.2 C)   Resp 16   Ht 5\' 6"  (1.676 m)   Wt 169 lb 3.2 oz (76.7 kg)   BMI 27.31 kg/m      09/29/2022    7:31 AM 09/02/2022    1:21 PM 05/28/2022    1:40 PM  BP/Weight  Systolic BP 110 140 136  Diastolic BP 60 72 80  Wt. (Lbs) 169.2 168.2 168  BMI 27.31 kg/m2 27.15 kg/m2 27.12 kg/m2    Physical Exam Vitals reviewed.  Constitutional:      Appearance: Normal appearance. She is normal weight.  Neck:     Vascular: No carotid bruit.  Cardiovascular:     Rate and Rhythm: Normal rate and regular rhythm.     Heart sounds: Normal heart sounds.  Pulmonary:     Effort: Pulmonary effort is normal. No respiratory distress.     Breath sounds: Normal breath sounds.  Abdominal:     General: Abdomen is flat. Bowel sounds are normal.     Palpations: Abdomen is soft.     Tenderness: There is no abdominal tenderness.  Musculoskeletal:     Comments: Calcifications right knee.    Neurological:     Mental Status: She is alert and oriented to person, place, and time.  Psychiatric:        Mood and Affect: Mood normal.        Behavior: Behavior normal.     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  No changes to medicines.  Chlorthalidone 25 mg everyday, clonidine 0.3 mg daily, Metoprolol 50 mg daily. Continue to work on eating a healthy diet and exercise.  Labs drawn today.    Orders: -     CBC with Differential/Platelet -     Comprehensive metabolic panel  Other specified hypothyroidism Assessment & Plan: Previously well controlled Continue Synthroid at current dose  Recheck TSH and adjust Synthroid as indicated    Orders: -     TSH  Vitamin D insufficiency Assessment & Plan: check vitamin D level.   Prediabetes Assessment & Plan: Recommend continue to work on eating healthy diet and exercise.   Orders: -     Hemoglobin A1c  Mixed hyperlipidemia Assessment & Plan: Intolerant to statins.  Is recommend low-fat diet.  Orders: -     Lipid panel  CLL (chronic lymphocytic leukemia) (HCC) Assessment & Plan: Sees oncology yearly.    Antiphospholipid syndrome [facility]) Assessment & Plan: Continue  eliquis      No orders of the defined types were placed in this encounter.   Orders Placed This Encounter  Procedures   CBC with Differential/Platelet   Comprehensive metabolic panel   Hemoglobin A1c   Lipid panel   TSH     Follow-up: Return in about 6 months (around 04/01/2023) for chronic follow up, fasting.   I,Marla I Leal-Borjas,acting as a scribe for Blane Ohara, MD.,have documented all relevant documentation on the behalf of Blane Ohara, MD,as directed by  Blane Ohara, MD while in the presence of Blane Ohara, MD.   An After Visit Summary was printed and given to the patient.  Blane Ohara, MD [facility] [phone]

## 2022-09-27 NOTE — Assessment & Plan Note (Signed)
Recommend continue to work on eating healthy diet and exercise.  

## 2022-09-29 ENCOUNTER — Ambulatory Visit (INDEPENDENT_AMBULATORY_CARE_PROVIDER_SITE_OTHER): Payer: Medicare HMO | Admitting: Family Medicine

## 2022-09-29 ENCOUNTER — Encounter: Payer: Self-pay | Admitting: Family Medicine

## 2022-09-29 VITALS — BP 110/60 | HR 64 | Temp 95.3°F | Resp 16 | Ht 66.0 in | Wt 169.2 lb

## 2022-09-29 DIAGNOSIS — E559 Vitamin D deficiency, unspecified: Secondary | ICD-10-CM

## 2022-09-29 DIAGNOSIS — R7303 Prediabetes: Secondary | ICD-10-CM | POA: Diagnosis not present

## 2022-09-29 DIAGNOSIS — D6861 Antiphospholipid syndrome: Secondary | ICD-10-CM | POA: Diagnosis not present

## 2022-09-29 DIAGNOSIS — E038 Other specified hypothyroidism: Secondary | ICD-10-CM

## 2022-09-29 DIAGNOSIS — I129 Hypertensive chronic kidney disease with stage 1 through stage 4 chronic kidney disease, or unspecified chronic kidney disease: Secondary | ICD-10-CM

## 2022-09-29 DIAGNOSIS — C9110 Chronic lymphocytic leukemia of B-cell type not having achieved remission: Secondary | ICD-10-CM | POA: Diagnosis not present

## 2022-09-29 DIAGNOSIS — N1832 Chronic kidney disease, stage 3b: Secondary | ICD-10-CM | POA: Diagnosis not present

## 2022-09-29 DIAGNOSIS — I10 Essential (primary) hypertension: Secondary | ICD-10-CM | POA: Diagnosis not present

## 2022-09-29 DIAGNOSIS — I4891 Unspecified atrial fibrillation: Secondary | ICD-10-CM | POA: Diagnosis not present

## 2022-09-29 DIAGNOSIS — E782 Mixed hyperlipidemia: Secondary | ICD-10-CM | POA: Diagnosis not present

## 2022-09-29 NOTE — Assessment & Plan Note (Signed)
Continue eliquis  ?

## 2022-09-29 NOTE — Patient Instructions (Signed)
Needs shingrix and Tetanus at the pharmacy.

## 2022-09-29 NOTE — Assessment & Plan Note (Signed)
Sees oncology yearly 

## 2022-09-30 LAB — CBC WITH DIFFERENTIAL/PLATELET
Basophils Absolute: 0.2 10*3/uL (ref 0.0–0.2)
Basos: 1 %
EOS (ABSOLUTE): 0.8 10*3/uL — ABNORMAL HIGH (ref 0.0–0.4)
Eos: 2 %
Hematocrit: 35.3 % (ref 34.0–46.6)
Hemoglobin: 11.3 g/dL (ref 11.1–15.9)
Immature Grans (Abs): 0 10*3/uL (ref 0.0–0.1)
Immature Granulocytes: 0 %
Lymphocytes Absolute: 27.1 10*3/uL — ABNORMAL HIGH (ref 0.7–3.1)
Lymphs: 80 %
MCH: 29.8 pg (ref 26.6–33.0)
MCHC: 32 g/dL (ref 31.5–35.7)
MCV: 93 fL (ref 79–97)
Monocytes Absolute: 1 10*3/uL — ABNORMAL HIGH (ref 0.1–0.9)
Monocytes: 3 %
Neutrophils Absolute: 4.6 10*3/uL (ref 1.4–7.0)
Neutrophils: 14 %
Platelets: 262 10*3/uL (ref 150–450)
RBC: 3.79 x10E6/uL (ref 3.77–5.28)
RDW: 13.3 % (ref 11.7–15.4)
WBC: 33.8 10*3/uL (ref 3.4–10.8)

## 2022-09-30 LAB — LIPID PANEL
Chol/HDL Ratio: 4.3 ratio (ref 0.0–4.4)
Cholesterol, Total: 153 mg/dL (ref 100–199)
HDL: 36 mg/dL — ABNORMAL LOW
LDL Chol Calc (NIH): 94 mg/dL (ref 0–99)
Triglycerides: 129 mg/dL (ref 0–149)
VLDL Cholesterol Cal: 23 mg/dL (ref 5–40)

## 2022-09-30 LAB — COMPREHENSIVE METABOLIC PANEL WITH GFR
ALT: 9 [IU]/L (ref 0–32)
AST: 18 [IU]/L (ref 0–40)
Albumin: 3.9 g/dL (ref 3.8–4.8)
Alkaline Phosphatase: 93 [IU]/L (ref 44–121)
BUN/Creatinine Ratio: 18 (ref 12–28)
BUN: 27 mg/dL (ref 8–27)
Bilirubin Total: 0.7 mg/dL (ref 0.0–1.2)
CO2: 19 mmol/L — ABNORMAL LOW (ref 20–29)
Calcium: 8.4 mg/dL — ABNORMAL LOW (ref 8.7–10.3)
Chloride: 105 mmol/L (ref 96–106)
Creatinine, Ser: 1.51 mg/dL — ABNORMAL HIGH (ref 0.57–1.00)
Globulin, Total: 2.3 g/dL (ref 1.5–4.5)
Glucose: 101 mg/dL — ABNORMAL HIGH (ref 70–99)
Potassium: 5.2 mmol/L (ref 3.5–5.2)
Sodium: 137 mmol/L (ref 134–144)
Total Protein: 6.2 g/dL (ref 6.0–8.5)
eGFR: 35 mL/min/{1.73_m2} — ABNORMAL LOW

## 2022-09-30 LAB — HEMOGLOBIN A1C
Est. average glucose Bld gHb Est-mCnc: 117 mg/dL
Hgb A1c MFr Bld: 5.7 % — ABNORMAL HIGH (ref 4.8–5.6)

## 2022-09-30 LAB — TSH: TSH: 2.31 u[IU]/mL (ref 0.450–4.500)

## 2022-09-30 LAB — LITHOLINK CKD PROGRAM

## 2022-10-12 ENCOUNTER — Other Ambulatory Visit: Payer: Self-pay | Admitting: Family Medicine

## 2022-10-16 DIAGNOSIS — M25461 Effusion, right knee: Secondary | ICD-10-CM | POA: Diagnosis not present

## 2022-10-16 DIAGNOSIS — M1711 Unilateral primary osteoarthritis, right knee: Secondary | ICD-10-CM | POA: Diagnosis not present

## 2022-10-16 DIAGNOSIS — M25561 Pain in right knee: Secondary | ICD-10-CM | POA: Diagnosis not present

## 2023-01-10 ENCOUNTER — Other Ambulatory Visit: Payer: Self-pay | Admitting: Family Medicine

## 2023-01-18 NOTE — Assessment & Plan Note (Addendum)
Previously well controlled Continue Synthroid at current dose  

## 2023-01-18 NOTE — Assessment & Plan Note (Signed)
Well controlled.  No changes to medicines.  Chlorthalidone 25 mg everyday, clonidine 0.3 mg daily, Metoprolol 50 mg daily. Continue to work on eating a healthy diet and exercise.  Labs drawn today.

## 2023-01-18 NOTE — Progress Notes (Signed)
Subjective:  Patient ID: Cynthia Gardner, female    DOB: [birth year]  Age: 77 y.o. MRN: [id number]  Chief Complaint  Patient presents with   Medical Management of Chronic Issues    HPI   Hypothyroidism: Levothyroxine 75 mcg daily.  Hypertension: Current medications: Chlorthalidone 25 mg everyday, clonidine 0.3 mg twice daily, Metoprolol 50 mg daily.  Antiphospholipid syndrome/paroxysmal atrial fib:  Eliquis 5 mg twice a day.  CLL: sees oncology. Follows up yearly.    Diet: healthy Exercise: "RUNNING ALL DAY LONG"     01/19/2023    7:42 AM 09/29/2022    7:34 AM 09/02/2022    1:23 PM 05/27/2022    2:11 PM 12/27/2020   10:30 AM  Depression screen PHQ 2/9  Decreased Interest 0 0 0 0 0  Down, Depressed, Hopeless 0 0 0 0 0  PHQ - 2 Score 0 0 0 0 0  Altered sleeping 0 0     Tired, decreased energy 0 0     Change in appetite 0 0     Feeling bad or failure about yourself  0 0     Trouble concentrating 0 0     Moving slowly or fidgety/restless 0 0     Suicidal thoughts 0 0     PHQ-9 Score 0 0     Difficult doing work/chores Not difficult at all Not difficult at all           01/19/2023    7:42 AM  Fall Risk   Falls in the past year? 0  Number falls in past yr: 0  Injury with Fall? 0  Risk for fall due to : No Fall Risks  Follow up Falls evaluation completed;Falls prevention discussed    Patient Care Team: Blane Ohara, MD as PCP - General (Family Medicine) Drue Second, MD as Referring Physician (Oncology)   Review of Systems  Constitutional:  Negative for chills, fatigue and fever.  HENT:  Positive for ear pain (rt). Negative for congestion and sore throat.   Respiratory:  Negative for cough and shortness of breath.   Cardiovascular:  Negative for chest pain.  Gastrointestinal:  Negative for abdominal pain, constipation, diarrhea, nausea and vomiting.  Genitourinary:  Negative for dysuria and urgency.  Musculoskeletal:  Positive for arthralgias (Knees. Stiff in back.  Does not take anything.). Negative for myalgias.  Skin:  Negative for rash.  Neurological:  Negative for dizziness and headaches.  Psychiatric/Behavioral:  Negative for dysphoric mood. The patient is not nervous/anxious.     Current Outpatient Medications on File Prior to Visit  Medication Sig Dispense Refill   cetirizine (ZYRTEC) 10 MG tablet TAKE 1 TABLET BY MOUTH EVERY DAY 90 tablet 3   chlorthalidone (HYGROTON) 25 MG tablet TAKE 1 TABLET BY MOUTH EVERY DAY 90 tablet 1   Cholecalciferol (VITAMIN D3 PO) Take 1 tablet by mouth daily.     cloNIDine (CATAPRES) 0.3 MG tablet TAKE 1 TABLET BY MOUTH TWICE A DAY 180 tablet 1   CVS CHEWABLE C WITH ROSE HIPS 500 MG CHEW TAKE 1 TABLET BY MOUTH EVERY DAY 100 tablet 1   diclofenac Sodium (VOLTAREN) 1 % GEL Apply four times a day to hip. 1000 g 1   diphenoxylate-atropine (LOMOTIL) 2.5-0.025 MG tablet Take 1 tablet by mouth 4 (four) times daily as needed for diarrhea or loose stools. 60 tablet 2   ELIQUIS 5 MG TABS tablet TAKE 1 TABLET BY MOUTH TWICE A DAY 60 tablet 5   fluticasone (  FLONASE) 50 MCG/ACT nasal spray SPRAY 2 SPRAYS INTO EACH NOSTRIL EVERY DAY 48 mL 5   folic acid (FOLVITE) 1 MG tablet TAKE 1 TABLET BY MOUTH EVERY DAY 90 tablet 1   levothyroxine (SYNTHROID) 75 MCG tablet TAKE 1 TABLET BY MOUTH EVERY DAY BEFORE BREAKFAST 90 tablet 1   metoprolol tartrate (LOPRESSOR) 50 MG tablet TAKE 1 TABLET BY MOUTH TWICE A DAY. PT NEEDS TO CALL TO RESCHEDULE FASTING APPT. 180 tablet 1   Multiple Vitamins-Minerals (HAIR SKIN AND NAILS FORMULA) TABS Take by mouth.     Multiple Vitamins-Minerals (ZINC PO) Take 1 tablet by mouth daily.     potassium chloride (MICRO-K) 10 MEQ CR capsule TAKE 4 CAPSULES BY MOUTH EVERY DAY 360 capsule 1   Vitamin D, Ergocalciferol, (DRISDOL) 1.25 MG (50000 UNIT) CAPS capsule TAKE 1 CAPSULE BY MOUTH ONE TIME PER WEEK 12 capsule 1   Zinc Sulfate (ZINC-220 PO) Take by mouth.     No current facility-administered medications on file  prior to visit.   Past Medical History:  Diagnosis Date   Antiphospholipid antibody syndrome (HCC)    CVA (cerebral vascular accident) (HCC) 04/06/2019   History of COVID-19    HTN (hypertension) 04/06/2019   Hypothyroidism 04/06/2019   Leukemia (HCC)    Past Surgical History:  Procedure Laterality Date   APPENDECTOMY     CATARACT EXTRACTION Right    COLON RESECTION  2008   ischemic bowel   TONSILLECTOMY      Family History  Problem Relation Age of Onset   Parkinson's disease Father    Cirrhosis Brother        from agent orange   Hypertension Other    Diabetes Other    Social History   Socioeconomic History   Marital status: Married    Spouse name: Charles   Number of children: 2   Years of education: Not on file   Highest education level: Not on file  Occupational History   Not on file  Tobacco Use   Smoking status: Former   Smokeless tobacco: Never  Vaping Use   Vaping status: Never Used  Substance and Sexual Activity   Alcohol use: Not Currently   Drug use: Never   Sexual activity: Not Currently    Birth control/protection: None  Other Topics Concern   Not on file  Social History Narrative   Not on file   Social Determinants of Health   Financial Resource Strain: Low Risk  (09/29/2022)   Overall Financial Resource Strain (CARDIA)    Difficulty of Paying Living Expenses: Not hard at all  Food Insecurity: No Food Insecurity (09/29/2022)   Hunger Vital Sign    Worried About Running Out of Food in the Last Year: Never true    Ran Out of Food in the Last Year: Never true  Transportation Needs: No Transportation Needs (09/29/2022)   PRAPARE - Administrator, Civil Service (Medical): No    Lack of Transportation (Non-Medical): No  Physical Activity: Insufficiently Active (05/27/2022)   Exercise Vital Sign    Days of Exercise per Week: 3 days    Minutes of Exercise per Session: 30 min  Stress: No Stress Concern Present (09/29/2022)   [employer] of  Occupational Health - Occupational Stress Questionnaire    Feeling of Stress : Only a little  Social Connections: Socially Integrated (05/27/2022)   Social Connection and Isolation Panel [NHANES]    Frequency of Communication with Friends and Family: More than three  times a week    Frequency of Social Gatherings with Friends and Family: Three times a week    Attends Religious Services: More than 4 times per year    Active Member of Clubs or Organizations: Yes    Attends Banker Meetings: Never    Marital Status: Married    Objective:  BP 110/70   Pulse (!) 56   Temp (!) 92.6 F (33.7 C)   Resp 16   Ht 5\' 6"  (1.676 m)   Wt 167 lb 6.4 oz (75.9 kg)   BMI 27.02 kg/m      01/19/2023    7:34 AM 09/29/2022    7:31 AM 09/02/2022    1:21 PM  BP/Weight  Systolic BP 110 110 140  Diastolic BP 70 60 72  Wt. (Lbs) 167.4 169.2 168.2  BMI 27.02 kg/m2 27.31 kg/m2 27.15 kg/m2    Physical Exam Vitals reviewed.  Constitutional:      Appearance: Normal appearance. She is normal weight.  Neck:     Vascular: No carotid bruit.  Cardiovascular:     Rate and Rhythm: Normal rate and regular rhythm.     Heart sounds: Normal heart sounds.  Pulmonary:     Effort: Pulmonary effort is normal. No respiratory distress.     Breath sounds: Normal breath sounds.  Abdominal:     General: Abdomen is flat. Bowel sounds are normal.     Palpations: Abdomen is soft.     Tenderness: There is no abdominal tenderness.  Neurological:     Mental Status: She is alert and oriented to person, place, and time.  Psychiatric:        Mood and Affect: Mood normal.        Behavior: Behavior normal.     Diabetic Foot Exam - Simple   No data filed      Lab Results  Component Value Date   WBC 39.0 (HH) 01/19/2023   HGB 12.0 01/19/2023   HCT 37.2 01/19/2023   PLT 256 01/19/2023   GLUCOSE 106 (H) 01/19/2023   CHOL 159 01/19/2023   TRIG 133 01/19/2023   HDL 37 (L) 01/19/2023   LDLCALC 98  01/19/2023   ALT 11 01/19/2023   AST 17 01/19/2023   NA 140 01/19/2023   K 4.9 01/19/2023   CL 106 01/19/2023   CREATININE 1.53 (H) 01/19/2023   BUN 28 (H) 01/19/2023   CO2 20 01/19/2023   TSH 2.310 09/29/2022   HGBA1C 5.7 (H) 09/29/2022      Assessment & Plan:    Primary hypertension Assessment & Plan: Well controlled.  No changes to medicines.  Chlorthalidone 25 mg everyday, clonidine 0.3 mg daily, Metoprolol 50 mg daily. Continue to work on eating a healthy diet and exercise.  Labs drawn today.    Orders: -     CBC with Differential/Platelet -     Comprehensive metabolic panel  Other specified hypothyroidism Assessment & Plan: Previously well controlled Continue Synthroid at current dose      Antiphospholipid syndrome [facility]) Assessment & Plan: Continue eliquis   Mixed hyperlipidemia Assessment & Plan: Recommend continue to work on eating healthy diet and exercise.   Orders: -     Lipid panel  Vitamin D deficiency Assessment & Plan: check vitamin D level.  Orders: -     VITAMIN D 25 Hydroxy (Vit-D Deficiency, Fractures)  CLL (chronic lymphocytic leukemia) (HCC) Assessment & Plan: Management per specialist.     Encounter for immunization -  Flu Vaccine Trivalent High Dose (Fluad)     No orders of the defined types were placed in this encounter.   Orders Placed This Encounter  Procedures   Flu Vaccine Trivalent High Dose (Fluad)   CBC with Differential/Platelet   Comprehensive metabolic panel   Lipid panel   VITAMIN D 25 Hydroxy (Vit-D Deficiency, Fractures)     Follow-up: Return in about 3 months (around 04/21/2023) for chronic follow up.   I,Marla I Leal-Borjas,acting as a scribe for Blane Ohara, MD.,have documented all relevant documentation on the behalf of Blane Ohara, MD,as directed by  Blane Ohara, MD while in the presence of Blane Ohara, MD.   An After Visit Summary was printed and given to the patient.  I attest that I  have reviewed this visit and agree with the plan scribed by my staff.   Blane Ohara, MD [facility] [phone]

## 2023-01-18 NOTE — Assessment & Plan Note (Signed)
Recommend continue to work on eating healthy diet and exercise.  

## 2023-01-18 NOTE — Assessment & Plan Note (Signed)
Continue eliquis  ?

## 2023-01-18 NOTE — Assessment & Plan Note (Signed)
check vitamin D level

## 2023-01-19 ENCOUNTER — Encounter: Payer: Self-pay | Admitting: Family Medicine

## 2023-01-19 ENCOUNTER — Ambulatory Visit (INDEPENDENT_AMBULATORY_CARE_PROVIDER_SITE_OTHER): Payer: Medicare HMO | Admitting: Family Medicine

## 2023-01-19 VITALS — BP 110/70 | HR 56 | Temp 92.6°F | Resp 16 | Ht 66.0 in | Wt 167.4 lb

## 2023-01-19 DIAGNOSIS — E038 Other specified hypothyroidism: Secondary | ICD-10-CM | POA: Diagnosis not present

## 2023-01-19 DIAGNOSIS — E782 Mixed hyperlipidemia: Secondary | ICD-10-CM | POA: Diagnosis not present

## 2023-01-19 DIAGNOSIS — I10 Essential (primary) hypertension: Secondary | ICD-10-CM

## 2023-01-19 DIAGNOSIS — E559 Vitamin D deficiency, unspecified: Secondary | ICD-10-CM

## 2023-01-19 DIAGNOSIS — Z23 Encounter for immunization: Secondary | ICD-10-CM | POA: Diagnosis not present

## 2023-01-19 DIAGNOSIS — C9110 Chronic lymphocytic leukemia of B-cell type not having achieved remission: Secondary | ICD-10-CM

## 2023-01-19 DIAGNOSIS — D6861 Antiphospholipid syndrome: Secondary | ICD-10-CM | POA: Diagnosis not present

## 2023-01-19 NOTE — Assessment & Plan Note (Signed)
Management per specialist. 

## 2023-01-19 NOTE — Progress Notes (Deleted)
j

## 2023-01-21 LAB — COMPREHENSIVE METABOLIC PANEL WITH GFR
ALT: 11 [IU]/L (ref 0–32)
AST: 17 [IU]/L (ref 0–40)
Albumin: 4.1 g/dL (ref 3.8–4.8)
Alkaline Phosphatase: 97 [IU]/L (ref 44–121)
BUN/Creatinine Ratio: 18 (ref 12–28)
BUN: 28 mg/dL — ABNORMAL HIGH (ref 8–27)
Bilirubin Total: 0.9 mg/dL (ref 0.0–1.2)
CO2: 20 mmol/L (ref 20–29)
Calcium: 8.5 mg/dL — ABNORMAL LOW (ref 8.7–10.3)
Chloride: 106 mmol/L (ref 96–106)
Creatinine, Ser: 1.53 mg/dL — ABNORMAL HIGH (ref 0.57–1.00)
Globulin, Total: 2 g/dL (ref 1.5–4.5)
Glucose: 106 mg/dL — ABNORMAL HIGH (ref 70–99)
Potassium: 4.9 mmol/L (ref 3.5–5.2)
Sodium: 140 mmol/L (ref 134–144)
Total Protein: 6.1 g/dL (ref 6.0–8.5)
eGFR: 35 mL/min/{1.73_m2} — ABNORMAL LOW

## 2023-01-21 LAB — LIPID PANEL
Chol/HDL Ratio: 4.3 ratio (ref 0.0–4.4)
Cholesterol, Total: 159 mg/dL (ref 100–199)
HDL: 37 mg/dL — ABNORMAL LOW
LDL Chol Calc (NIH): 98 mg/dL (ref 0–99)
Triglycerides: 133 mg/dL (ref 0–149)
VLDL Cholesterol Cal: 24 mg/dL (ref 5–40)

## 2023-01-21 LAB — VITAMIN D 25 HYDROXY (VIT D DEFICIENCY, FRACTURES): Vit D, 25-Hydroxy: 34.4 ng/mL (ref 30.0–100.0)

## 2023-01-21 LAB — CBC WITH DIFFERENTIAL/PLATELET
Basophils Absolute: 0.2 10*3/uL (ref 0.0–0.2)
Basos: 1 %
EOS (ABSOLUTE): 1 10*3/uL — ABNORMAL HIGH (ref 0.0–0.4)
Eos: 3 %
Hematocrit: 37.2 % (ref 34.0–46.6)
Hemoglobin: 12 g/dL (ref 11.1–15.9)
Immature Grans (Abs): 0 10*3/uL (ref 0.0–0.1)
Immature Granulocytes: 0 %
Lymphocytes Absolute: 29.6 10*3/uL — ABNORMAL HIGH (ref 0.7–3.1)
Lymphs: 75 %
MCH: 30.3 pg (ref 26.6–33.0)
MCHC: 32.3 g/dL (ref 31.5–35.7)
MCV: 94 fL (ref 79–97)
Monocytes Absolute: 1.9 10*3/uL — ABNORMAL HIGH (ref 0.1–0.9)
Monocytes: 5 %
Neutrophils Absolute: 6.1 10*3/uL (ref 1.4–7.0)
Neutrophils: 16 %
Platelets: 256 10*3/uL (ref 150–450)
RBC: 3.96 x10E6/uL (ref 3.77–5.28)
RDW: 13.5 % (ref 11.7–15.4)
WBC: 39 10*3/uL (ref 3.4–10.8)

## 2023-01-29 ENCOUNTER — Other Ambulatory Visit: Payer: Self-pay | Admitting: Family Medicine

## 2023-02-25 ENCOUNTER — Other Ambulatory Visit: Payer: Self-pay | Admitting: Family Medicine

## 2023-02-25 NOTE — Telephone Encounter (Signed)
Copied from CRM [id number]. Topic: Clinical - Medication Refill >> Feb 25, 2023  2:27 PM Desma Mcgregor wrote: Most Recent Primary Care Visit:  Provider: [facility]  Department: [facility]  Visit Type: OFFICE VISIT  Date: 01/19/2023  Medication: ELIQUIS 5 MG TABS tablet  Has the patient contacted their pharmacy? Yes (Agent: If no, request that the patient contact the pharmacy for the refill. If patient does not wish to contact the pharmacy document the reason why and proceed with request.) (Agent: If yes, when and what did the pharmacy advise?)  Is this the correct pharmacy for this prescription? Yes If no, delete pharmacy and type the correct one.  This is the patient's preferred pharmacy:  [facility] [address] [place] - [address] [place] [address] Phone: [phone] Fax: [phone]  [facility] [place], [place] - [address] [facility] AT Portal to Registered [facility] [address] [place] [address] Phone: [phone] Fax: [phone]  [facility] [phone] [address], [place] - [address] [place] [address] Phone: [phone] Fax: [phone]  Has the prescription been filled recently? Yes  Is the patient out of the medication? Yes, taking last tablet today  Has the patient been seen for an appointment in the last year OR does the patient have an upcoming appointment? Yes  Can we respond through MyChart? Yes  Agent: Please be advised that Rx refills may take up to 3 business days. We ask that you follow-up with your pharmacy.

## 2023-03-21 ENCOUNTER — Other Ambulatory Visit: Payer: Self-pay | Admitting: Family Medicine

## 2023-04-25 ENCOUNTER — Other Ambulatory Visit: Payer: Self-pay | Admitting: Family Medicine

## 2023-04-28 ENCOUNTER — Other Ambulatory Visit: Payer: Self-pay | Admitting: Family Medicine

## 2023-05-05 ENCOUNTER — Ambulatory Visit (INDEPENDENT_AMBULATORY_CARE_PROVIDER_SITE_OTHER): Payer: Medicare HMO | Admitting: Family Medicine

## 2023-05-05 VITALS — BP 104/74 | HR 66 | Temp 96.1°F | Resp 14 | Ht 66.0 in | Wt 162.8 lb

## 2023-05-05 DIAGNOSIS — E538 Deficiency of other specified B group vitamins: Secondary | ICD-10-CM

## 2023-05-05 DIAGNOSIS — C9110 Chronic lymphocytic leukemia of B-cell type not having achieved remission: Secondary | ICD-10-CM | POA: Diagnosis not present

## 2023-05-05 DIAGNOSIS — I480 Paroxysmal atrial fibrillation: Secondary | ICD-10-CM

## 2023-05-05 DIAGNOSIS — D649 Anemia, unspecified: Secondary | ICD-10-CM | POA: Diagnosis not present

## 2023-05-05 DIAGNOSIS — N1832 Chronic kidney disease, stage 3b: Secondary | ICD-10-CM | POA: Diagnosis not present

## 2023-05-05 DIAGNOSIS — E038 Other specified hypothyroidism: Secondary | ICD-10-CM | POA: Diagnosis not present

## 2023-05-05 DIAGNOSIS — D6861 Antiphospholipid syndrome: Secondary | ICD-10-CM | POA: Diagnosis not present

## 2023-05-05 DIAGNOSIS — E782 Mixed hyperlipidemia: Secondary | ICD-10-CM

## 2023-05-05 DIAGNOSIS — I10 Essential (primary) hypertension: Secondary | ICD-10-CM

## 2023-05-05 DIAGNOSIS — I129 Hypertensive chronic kidney disease with stage 1 through stage 4 chronic kidney disease, or unspecified chronic kidney disease: Secondary | ICD-10-CM | POA: Diagnosis not present

## 2023-05-05 DIAGNOSIS — R7303 Prediabetes: Secondary | ICD-10-CM

## 2023-05-05 DIAGNOSIS — E559 Vitamin D deficiency, unspecified: Secondary | ICD-10-CM | POA: Diagnosis not present

## 2023-05-05 NOTE — Assessment & Plan Note (Signed)
 Recommend continue to work on eating healthy diet and exercise.

## 2023-05-05 NOTE — Assessment & Plan Note (Addendum)
Well controlled.  ?No changes to medicines.  ?Continue to work on eating a healthy diet and exercise.  ?Labs drawn today.  ?

## 2023-05-05 NOTE — Assessment & Plan Note (Signed)
 Previously well controlled Continue Synthroid at current dose

## 2023-05-05 NOTE — Patient Instructions (Signed)
Please check if you are taking indapamide or chlorthalidone and let us know.

## 2023-05-05 NOTE — Assessment & Plan Note (Signed)
check vitamin D level

## 2023-05-05 NOTE — Assessment & Plan Note (Signed)
 Continue eliquis  ?

## 2023-05-05 NOTE — Assessment & Plan Note (Signed)
Resolved. Secondary to COVID-19.  Still on Eliquis for antiphospholipid antibody syndrome anyways.

## 2023-05-05 NOTE — Assessment & Plan Note (Signed)
 Management per specialist.

## 2023-05-05 NOTE — Progress Notes (Signed)
 Subjective:  Patient ID: Cynthia Gardner, female    DOB: 26-Jan-1946  Age: 78 y.o. MRN: [id number]  Chief Complaint  Patient presents with   Medical Management of Chronic Issues    HPI   Hypothyroidism: Levothyroxine  75 mcg daily.   Hypertension: Current medications: Patient is confused if taking indapamide or Chlorthalidone  25 mg everyday, clonidine 0.3 mg 1/2 IN AM AND ONE AT NIGHT, Metoprolol  50 mg twice daily.  Patient is also on potassium and takes 10 mEq for daily.    Antiphospholipid syndrome/paroxysmal atrial fib:  Eliquis  5 mg twice a day.   CLL: sees oncology. Follows up yearly.     Allergic rhinitis currently on Zyrtec and Flonase .  Patient does complain of some hearing issues but has not had time to have her ears checked.  Diet: healthy Exercise:  no     05/05/2023    8:28 AM 01/19/2023    7:42 AM 09/29/2022    7:34 AM 09/02/2022    1:23 PM 05/27/2022    2:11 PM  Depression screen PHQ 2/9  Decreased Interest 0 0 0 0 0  Down, Depressed, Hopeless 0 0 0 0 0  PHQ - 2 Score 0 0 0 0 0  Altered sleeping 1 0 0    Tired, decreased energy 1 0 0    Change in appetite 0 0 0    Feeling bad or failure about yourself  0 0 0    Trouble concentrating  0 0    Moving slowly or fidgety/restless 0 0 0    Suicidal thoughts 0 0 0    PHQ-9 Score 2 0 0    Difficult doing work/chores Not difficult at all Not difficult at all Not difficult at all          05/05/2023    8:29 AM  Fall Risk   Falls in the past year? 0  Number falls in past yr: 0  Injury with Fall? 0  Risk for fall due to : No Fall Risks  Follow up Falls evaluation completed    Patient Care Team: Sherre Clapper, MD as PCP - General (Family Medicine) Fernand Evans, MD as Referring Physician (Oncology)   Review of Systems  Constitutional:  Negative for chills, fatigue and fever.  HENT:  Positive for hearing loss. Negative for congestion, ear pain, rhinorrhea and sore throat.   Respiratory:  Negative for cough and  shortness of breath.   Cardiovascular:  Negative for chest pain.  Gastrointestinal:  Negative for abdominal pain, constipation, diarrhea, nausea and vomiting.  Genitourinary:  Negative for dysuria and urgency.  Musculoskeletal:  Negative for back pain and myalgias.  Neurological:  Negative for dizziness, weakness, light-headedness and headaches.  Psychiatric/Behavioral:  Negative for dysphoric mood. The patient is not nervous/anxious.     Current Outpatient Medications on File Prior to Visit  Medication Sig Dispense Refill   cetirizine (ZYRTEC) 10 MG tablet TAKE 1 TABLET BY MOUTH EVERY DAY 90 tablet 3   Cholecalciferol (VITAMIN D3 PO) Take 1 tablet by mouth daily.     cloNIDine (CATAPRES) 0.3 MG tablet TAKE 1 TABLET BY MOUTH TWICE A DAY 180 tablet 0   [facility] CHEWABLE C WITH ROSE HIPS 500 MG CHEW TAKE 1 TABLET BY MOUTH EVERY DAY 100 tablet 1   diclofenac  Sodium (VOLTAREN ) 1 % GEL Apply four times a day to hip. 1000 g 1   diphenoxylate -atropine  (LOMOTIL ) 2.5-0.025 MG tablet Take 1 tablet by mouth 4 (four) times daily as needed  for diarrhea or loose stools. 60 tablet 2   ELIQUIS  5 MG TABS tablet TAKE 1 TABLET BY MOUTH TWICE A DAY 60 tablet 5   fluticasone  (FLONASE ) 50 MCG/ACT nasal spray SPRAY 2 SPRAYS INTO EACH NOSTRIL EVERY DAY 48 mL 5   folic acid  (FOLVITE ) 1 MG tablet TAKE 1 TABLET BY MOUTH EVERY DAY 90 tablet 1   indapamide (LOZOL) 2.5 MG tablet Take 1 tablet (2.5 mg total) by mouth daily. 90 tablet 0   levothyroxine  (SYNTHROID ) 75 MCG tablet TAKE 1 TABLET BY MOUTH EVERY DAY BEFORE BREAKFAST 90 tablet 1   metoprolol  tartrate (LOPRESSOR ) 50 MG tablet TAKE 1 TABLET BY MOUTH TWICE A DAY. PT NEEDS TO CALL TO RESCHEDULE FASTING APPT. 180 tablet 1   Multiple Vitamins-Minerals (HAIR SKIN AND NAILS FORMULA) TABS Take by mouth.     Multiple Vitamins-Minerals (ZINC  PO) Take 1 tablet by mouth daily.     potassium chloride  (MICRO-K ) 10 MEQ CR capsule TAKE 4 CAPSULES BY MOUTH EVERY DAY 360 capsule 1    Vitamin D , Ergocalciferol , (DRISDOL ) 1.25 MG (50000 UNIT) CAPS capsule TAKE 1 CAPSULE BY MOUTH ONE TIME PER WEEK 12 capsule 1   Zinc  Sulfate (ZINC -220 PO) Take by mouth.     No current facility-administered medications on file prior to visit.   Past Medical History:  Diagnosis Date   Antiphospholipid antibody syndrome (HCC)    CVA (cerebral vascular accident) (HCC) 04/06/2019   History of COVID-19    HTN (hypertension) 04/06/2019   Hypothyroidism 04/06/2019   Leukemia (HCC)    Past Surgical History:  Procedure Laterality Date   APPENDECTOMY     CATARACT EXTRACTION Right    COLON RESECTION  2008   ischemic bowel   TONSILLECTOMY      Family History  Problem Relation Age of Onset   Parkinson's disease Father    Cirrhosis Brother        from agent orange   Hypertension Other    Diabetes Other    Social History   Socioeconomic History   Marital status: Married    Spouse name: Charles   Number of children: 2   Years of education: Not on file   Highest education level: Not on file  Occupational History   Not on file  Tobacco Use   Smoking status: Former   Smokeless tobacco: Never  Vaping Use   Vaping status: Never Used  Substance and Sexual Activity   Alcohol use: Not Currently   Drug use: Never   Sexual activity: Not Currently    Birth control/protection: None  Other Topics Concern   Not on file  Social History Narrative   Not on file   Social Drivers of Health   Financial Resource Strain: Low Risk  (09/29/2022)   Overall Financial Resource Strain (CARDIA)    Difficulty of Paying Living Expenses: Not hard at all  Food Insecurity: No Food Insecurity (09/29/2022)   Hunger Vital Sign    Worried About Running Out of Food in the Last Year: Never true    Ran Out of Food in the Last Year: Never true  Transportation Needs: No Transportation Needs (09/29/2022)   PRAPARE - Administrator, Civil Service (Medical): No    Lack of Transportation (Non-Medical): No   Physical Activity: Insufficiently Active (05/27/2022)   Exercise Vital Sign    Days of Exercise per Week: 3 days    Minutes of Exercise per Session: 30 min  Stress: No Stress Concern Present (09/29/2022)  [employer] of Occupational Health - Occupational Stress Questionnaire    Feeling of Stress : Only a little  Social Connections: Socially Integrated (05/27/2022)   Social Connection and Isolation Panel [NHANES]    Frequency of Communication with Friends and Family: More than three times a week    Frequency of Social Gatherings with Friends and Family: Three times a week    Attends Religious Services: More than 4 times per year    Active Member of Clubs or Organizations: Yes    Attends Banker Meetings: Never    Marital Status: Married    Objective:  BP 104/74   Pulse 66   Temp (!) 96.1 F (35.6 C)   Resp 14   Ht 5' 6 (1.676 m)   Wt 162 lb 12.8 oz (73.8 kg)   SpO2 98%   BMI 26.28 kg/m      05/05/2023    8:25 AM 01/19/2023    7:34 AM 09/29/2022    7:31 AM  BP/Weight  Systolic BP 104 110 110  Diastolic BP 74 70 60  Wt. (Lbs) 162.8 167.4 169.2  BMI 26.28 kg/m2 27.02 kg/m2 27.31 kg/m2    Physical Exam Vitals reviewed.  Constitutional:      Appearance: Normal appearance. She is normal weight.  HENT:     Right Ear: Tympanic membrane normal.     Left Ear: Tympanic membrane normal.  Neck:     Vascular: No carotid bruit.  Cardiovascular:     Rate and Rhythm: Normal rate and regular rhythm.     Heart sounds: Normal heart sounds.  Pulmonary:     Effort: Pulmonary effort is normal. No respiratory distress.     Breath sounds: Normal breath sounds.  Abdominal:     General: Abdomen is flat. Bowel sounds are normal.     Palpations: Abdomen is soft.     Tenderness: There is no abdominal tenderness.  Neurological:     Mental Status: She is alert and oriented to person, place, and time.  Psychiatric:        Mood and Affect: Mood normal.        Behavior:  Behavior normal.     Diabetic Foot Exam - Simple   No data filed      Lab Results  Component Value Date   WBC 36.3 (HH) 05/05/2023   HGB 10.4 (L) 05/05/2023   HCT 32.8 (L) 05/05/2023   PLT 281 05/05/2023   GLUCOSE 107 (H) 05/05/2023   CHOL 154 05/05/2023   TRIG 181 (H) 05/05/2023   HDL 32 (L) 05/05/2023   LDLCALC 91 05/05/2023   ALT 9 05/05/2023   AST 14 05/05/2023   NA 141 05/05/2023   K 5.1 05/05/2023   CL 109 (H) 05/05/2023   CREATININE 1.77 (H) 05/05/2023   BUN 36 (H) 05/05/2023   CO2 17 (L) 05/05/2023   TSH 2.310 09/29/2022   HGBA1C 5.2 05/05/2023      Assessment & Plan:    Primary hypertension Assessment & Plan: Well controlled.  No changes to medicines.  Continue to work on eating a healthy diet and exercise.  Labs drawn today.    Orders: -     CBC with Differential/Platelet -     Comprehensive metabolic panel  Other specified hypothyroidism Assessment & Plan: Previously well controlled Continue Synthroid  at current dose      Antiphospholipid syndrome [facility]) Assessment & Plan: Continue eliquis    Mixed hyperlipidemia Assessment & Plan: Recommend continue to work on  eating healthy diet and exercise.   Orders: -     Lipid panel  Vitamin D  deficiency Assessment & Plan: check vitamin D  level.  Orders: -     VITAMIN D  25 Hydroxy (Vit-D Deficiency, Fractures)  CLL (chronic lymphocytic leukemia) (HCC) Assessment & Plan: Management per specialist.     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  No changes to medicines.  Continue to work on eating a healthy diet and exercise.  Labs drawn today.     Prediabetes Assessment & Plan: Recommend continue to work on eating healthy diet and exercise.   Orders: -     Hemoglobin A1c  Paroxysmal atrial fibrillation (HCC) Assessment & Plan: Resolved. Secondary to COVID-19.  Still on Eliquis  for antiphospholipid antibody  syndrome anyways.   Other orders -     B12 and Folate Panel -     Specimen status report     No orders of the defined types were placed in this encounter.   Orders Placed This Encounter  Procedures   CBC with Differential/Platelet   Comprehensive metabolic panel   Lipid panel   Hemoglobin A1c   VITAMIN D  25 Hydroxy (Vit-D Deficiency, Fractures)   B12 and Folate Panel   Specimen status report     Follow-up: Return in about 4 months (around 09/02/2023) for chronic follow up.   I,Katherina A Bramblett,acting as a scribe for Abigail Free, MD.,have documented all relevant documentation on the behalf of Abigail Free, MD,as directed by  Abigail Free, MD while in the presence of Abigail Free, MD.   LILLETTE Kato I Leal-Borjas,acting as a scribe for Abigail Free, MD.,have documented all relevant documentation on the behalf of Abigail Free, MD,as directed by  Abigail Free, MD while in the presence of Abigail Free, MD.    An After Visit Summary was printed and given to the patient.  I attest that I have reviewed this visit and agree with the plan scribed by my staff.   Abigail Free, MD [facility] [phone]

## 2023-05-06 ENCOUNTER — Encounter: Payer: Self-pay | Admitting: Family Medicine

## 2023-05-06 LAB — CBC WITH DIFFERENTIAL/PLATELET
Basophils Absolute: 0.2 10*3/uL (ref 0.0–0.2)
Basos: 1 %
EOS (ABSOLUTE): 0.9 10*3/uL — ABNORMAL HIGH (ref 0.0–0.4)
Eos: 3 %
Hematocrit: 32.8 % — ABNORMAL LOW (ref 34.0–46.6)
Hemoglobin: 10.4 g/dL — ABNORMAL LOW (ref 11.1–15.9)
Immature Grans (Abs): 0 10*3/uL (ref 0.0–0.1)
Immature Granulocytes: 0 %
Lymphocytes Absolute: 26.8 10*3/uL — ABNORMAL HIGH (ref 0.7–3.1)
Lymphs: 73 %
MCH: 31 pg (ref 26.6–33.0)
MCHC: 31.7 g/dL (ref 31.5–35.7)
MCV: 98 fL — ABNORMAL HIGH (ref 79–97)
Monocytes Absolute: 1.9 10*3/uL — ABNORMAL HIGH (ref 0.1–0.9)
Monocytes: 5 %
Neutrophils Absolute: 6.4 10*3/uL (ref 1.4–7.0)
Neutrophils: 18 %
Platelets: 281 10*3/uL (ref 150–450)
RBC: 3.35 x10E6/uL — ABNORMAL LOW (ref 3.77–5.28)
RDW: 13.8 % (ref 11.7–15.4)
WBC: 36.3 10*3/uL (ref 3.4–10.8)

## 2023-05-06 LAB — COMPREHENSIVE METABOLIC PANEL WITH GFR
ALT: 9 [IU]/L (ref 0–32)
AST: 14 [IU]/L (ref 0–40)
Albumin: 4.1 g/dL (ref 3.8–4.8)
Alkaline Phosphatase: 109 [IU]/L (ref 44–121)
BUN/Creatinine Ratio: 20 (ref 12–28)
BUN: 36 mg/dL — ABNORMAL HIGH (ref 8–27)
Bilirubin Total: 0.7 mg/dL (ref 0.0–1.2)
CO2: 17 mmol/L — ABNORMAL LOW (ref 20–29)
Calcium: 8.2 mg/dL — ABNORMAL LOW (ref 8.7–10.3)
Chloride: 109 mmol/L — ABNORMAL HIGH (ref 96–106)
Creatinine, Ser: 1.77 mg/dL — ABNORMAL HIGH (ref 0.57–1.00)
Globulin, Total: 1.8 g/dL (ref 1.5–4.5)
Glucose: 107 mg/dL — ABNORMAL HIGH (ref 70–99)
Potassium: 5.1 mmol/L (ref 3.5–5.2)
Sodium: 141 mmol/L (ref 134–144)
Total Protein: 5.9 g/dL — ABNORMAL LOW (ref 6.0–8.5)
eGFR: 29 mL/min/{1.73_m2} — ABNORMAL LOW

## 2023-05-06 LAB — LIPID PANEL
Chol/HDL Ratio: 4.8 ratio — ABNORMAL HIGH (ref 0.0–4.4)
Cholesterol, Total: 154 mg/dL (ref 100–199)
HDL: 32 mg/dL — ABNORMAL LOW
LDL Chol Calc (NIH): 91 mg/dL (ref 0–99)
Triglycerides: 181 mg/dL — ABNORMAL HIGH (ref 0–149)
VLDL Cholesterol Cal: 31 mg/dL (ref 5–40)

## 2023-05-06 LAB — HEMOGLOBIN A1C
Est. average glucose Bld gHb Est-mCnc: 103 mg/dL
Hgb A1c MFr Bld: 5.2 % (ref 4.8–5.6)

## 2023-05-06 LAB — VITAMIN D 25 HYDROXY (VIT D DEFICIENCY, FRACTURES): Vit D, 25-Hydroxy: 34.1 ng/mL (ref 30.0–100.0)

## 2023-05-08 DIAGNOSIS — N1832 Chronic kidney disease, stage 3b: Secondary | ICD-10-CM | POA: Insufficient documentation

## 2023-05-08 DIAGNOSIS — I129 Hypertensive chronic kidney disease with stage 1 through stage 4 chronic kidney disease, or unspecified chronic kidney disease: Secondary | ICD-10-CM | POA: Insufficient documentation

## 2023-05-08 LAB — SPECIMEN STATUS REPORT

## 2023-05-08 LAB — B12 AND FOLATE PANEL
Folate: >20 ng/mL
Vitamin B-12: 301 pg/mL (ref 232–1245)

## 2023-05-08 NOTE — Assessment & Plan Note (Signed)
Well controlled.  ?No changes to medicines.  ?Continue to work on eating a healthy diet and exercise.  ?Labs drawn today.  ?

## 2023-05-09 ENCOUNTER — Encounter: Payer: Self-pay | Admitting: Family Medicine

## 2023-05-09 DIAGNOSIS — E538 Deficiency of other specified B group vitamins: Secondary | ICD-10-CM | POA: Insufficient documentation

## 2023-05-14 ENCOUNTER — Other Ambulatory Visit: Payer: Self-pay | Admitting: Family Medicine

## 2023-05-14 ENCOUNTER — Encounter: Payer: Self-pay | Admitting: Family Medicine

## 2023-05-14 ENCOUNTER — Telehealth: Payer: Self-pay

## 2023-05-14 MED ORDER — CHLORTHALIDONE 25 MG PO TABS: 25.0000 mg | ORAL_TABLET | Freq: Every day | ORAL | 1 refills | Status: DC

## 2023-05-14 NOTE — Telephone Encounter (Signed)
Patient is confues she pick up her medication and had Indapamide and was wondering why this medication was called in, she stated that she takes Chlorthalidone 25 mg for fluid if she has to, and was wondering if she is suppose to replace indapamide with the chlorthalidone, I recommend patient not take medication until she hears back from office Please advise   Copied from CRM [id number]. Topic: Clinical - Prescription Issue >> May 14, 2023 10:13 AM Herbert Seta B wrote: Reason for CRM: indapamide (LOZOL) 2.5 MG tablet Patient calling would like to speak to nurse or PCP, states does not need and blood pressure or "fluid" pills, confused on medicine administrations. [phone]

## 2023-05-14 NOTE — Telephone Encounter (Signed)
Patient stated she takes Chlorthalidone for fluid, she has not taking indapamide

## 2023-06-18 DIAGNOSIS — H903 Sensorineural hearing loss, bilateral: Secondary | ICD-10-CM | POA: Diagnosis not present

## 2023-07-09 ENCOUNTER — Ambulatory Visit (INDEPENDENT_AMBULATORY_CARE_PROVIDER_SITE_OTHER)

## 2023-07-09 VITALS — Ht 66.0 in | Wt 162.0 lb

## 2023-07-09 DIAGNOSIS — Z0000 Encounter for general adult medical examination without abnormal findings: Secondary | ICD-10-CM | POA: Diagnosis not present

## 2023-07-09 NOTE — Progress Notes (Signed)
 Subjective:   Cynthia Gardner is a 78 y.o. who presents for a Medicare Wellness preventive visit.  Visit Complete: Virtual I connected with  Kaylyn Lim on 07/09/23 by a audio enabled telemedicine application and verified that I am speaking with the correct person using two identifiers.  Patient Location: Home  Provider Location: Home Office  I discussed the limitations of evaluation and management by telemedicine. The patient expressed understanding and agreed to proceed.  Vital Signs: Because this visit was a virtual/telehealth visit, some criteria may be missing or patient reported. Any vitals not documented were not able to be obtained and vitals that have been documented are patient reported.  VideoDeclined- This patient declined Librarian, academic. Therefore the visit was completed with audio only.  Persons Participating in Visit: Patient.  AWV Questionnaire: No: Patient Medicare AWV questionnaire was not completed prior to this visit.  Cardiac Risk Factors include: advanced age (>86men, >58 women);hypertension     Objective:    Today's Vitals   07/09/23 1533  Weight: 162 lb (73.5 kg)  Height: 5\' 6"  (1.676 m)   Body mass index is 26.15 kg/m.     07/09/2023    3:43 PM 12/27/2020   10:28 AM 04/06/2019   11:00 AM  Advanced Directives  Does Patient Have a Medical Advance Directive? No Yes Yes  Type of Special educational needs teacher of [place];Living will Healthcare Power of Attorney  Does patient want to make changes to medical advance directive?   No - Patient declined  Copy of Healthcare Power of Attorney in Chart?  No - copy requested No - copy requested  Would patient like information on creating a medical advance directive? Yes (MAU/Ambulatory/Procedural Areas - Information given)      Current Medications (verified) Outpatient Encounter Medications as of 07/09/2023  Medication Sig   cetirizine (ZYRTEC) 10 MG tablet TAKE 1 TABLET BY  MOUTH EVERY DAY   chlorthalidone (HYGROTON) 25 MG tablet Take 1 tablet (25 mg total) by mouth daily.   Cholecalciferol (VITAMIN D3 PO) Take 1 tablet by mouth daily.   cloNIDine (CATAPRES) 0.3 MG tablet TAKE 1 TABLET BY MOUTH TWICE A DAY   CVS CHEWABLE C WITH ROSE HIPS 500 MG CHEW TAKE 1 TABLET BY MOUTH EVERY DAY   diclofenac Sodium (VOLTAREN) 1 % GEL Apply four times a day to hip.   diphenoxylate-atropine (LOMOTIL) 2.5-0.025 MG tablet Take 1 tablet by mouth 4 (four) times daily as needed for diarrhea or loose stools.   ELIQUIS 5 MG TABS tablet TAKE 1 TABLET BY MOUTH TWICE A DAY   fluticasone (FLONASE) 50 MCG/ACT nasal spray SPRAY 2 SPRAYS INTO EACH NOSTRIL EVERY DAY   folic acid (FOLVITE) 1 MG tablet TAKE 1 TABLET BY MOUTH EVERY DAY   levothyroxine (SYNTHROID) 75 MCG tablet TAKE 1 TABLET BY MOUTH EVERY DAY BEFORE BREAKFAST   metoprolol tartrate (LOPRESSOR) 50 MG tablet TAKE 1 TABLET BY MOUTH TWICE A DAY. PT NEEDS TO CALL TO RESCHEDULE FASTING APPT.   Multiple Vitamins-Minerals (HAIR SKIN AND NAILS FORMULA) TABS Take by mouth.   Multiple Vitamins-Minerals (ZINC PO) Take 1 tablet by mouth daily.   potassium chloride (MICRO-K) 10 MEQ CR capsule TAKE 4 CAPSULES BY MOUTH EVERY DAY   Vitamin D, Ergocalciferol, (DRISDOL) 1.25 MG (50000 UNIT) CAPS capsule TAKE 1 CAPSULE BY MOUTH ONE TIME PER WEEK   Zinc Sulfate (ZINC-220 PO) Take by mouth.   No facility-administered encounter medications on file as of 07/09/2023.  Allergies (verified) Patient has no known allergies.   History: Past Medical History:  Diagnosis Date   Antiphospholipid antibody syndrome (HCC)    CVA (cerebral vascular accident) (HCC) 04/06/2019   History of COVID-19    HTN (hypertension) 04/06/2019   Hypothyroidism 04/06/2019   Leukemia (HCC)    Past Surgical History:  Procedure Laterality Date   APPENDECTOMY     CATARACT EXTRACTION Right    COLON RESECTION  2008   ischemic bowel   TONSILLECTOMY     Family History  Problem  Relation Age of Onset   Parkinson's disease Father    Cirrhosis Brother        from agent orange   Hypertension Other    Diabetes Other    Social History   Socioeconomic History   Marital status: Married    Spouse name: Charles   Number of children: 2   Years of education: Not on file   Highest education level: Not on file  Occupational History   Not on file  Tobacco Use   Smoking status: Former   Smokeless tobacco: Never  Vaping Use   Vaping status: Never Used  Substance and Sexual Activity   Alcohol use: Not Currently   Drug use: Never   Sexual activity: Not Currently    Birth control/protection: None  Other Topics Concern   Not on file  Social History Narrative   Not on file   Social Drivers of Health   Financial Resource Strain: Low Risk  (07/09/2023)   Overall Financial Resource Strain (CARDIA)    Difficulty of Paying Living Expenses: Not hard at all  Food Insecurity: No Food Insecurity (07/09/2023)   Hunger Vital Sign    Worried About Running Out of Food in the Last Year: Never true    Ran Out of Food in the Last Year: Never true  Transportation Needs: No Transportation Needs (07/09/2023)   PRAPARE - Administrator, Civil Service (Medical): No    Lack of Transportation (Non-Medical): No  Physical Activity: Insufficiently Active (07/09/2023)   Exercise Vital Sign    Days of Exercise per Week: 3 days    Minutes of Exercise per Session: 30 min  Stress: No Stress Concern Present (07/09/2023)   [employer] of Occupational Health - Occupational Stress Questionnaire    Feeling of Stress : Not at all  Social Connections: Moderately Integrated (07/09/2023)   Social Connection and Isolation Panel [NHANES]    Frequency of Communication with Friends and Family: More than three times a week    Frequency of Social Gatherings with Friends and Family: Three times a week    Attends Religious Services: More than 4 times per year    Active Member of Clubs or  Organizations: No    Attends Banker Meetings: Never    Marital Status: Married    Tobacco Counseling Counseling given: Not Answered    Clinical Intake:  Pre-visit preparation completed: Yes  Pain : No/denies pain     Diabetes: No  Lab Results  Component Value Date   HGBA1C 5.2 05/05/2023   HGBA1C 5.7 (H) 09/29/2022   HGBA1C 5.8 (H) 12/10/2021     How often do you need to have someone help you when you read instructions, pamphlets, or other written materials from your doctor or pharmacy?: 1 - Never  Interpreter Needed?: No  Information entered by :: Kandis Fantasia LPN   Activities of Daily Living     07/09/2023    3:41  PM  In your present state of health, do you have any difficulty performing the following activities:  Hearing? 0  Vision? 0  Difficulty concentrating or making decisions? 0  Walking or climbing stairs? 0  Dressing or bathing? 0  Doing errands, shopping? 0  Preparing Food and eating ? N  Using the Toilet? N  In the past six months, have you accidently leaked urine? N  Do you have problems with loss of bowel control? N  Managing your Medications? N  Managing your Finances? N  Housekeeping or managing your Housekeeping? N    Patient Care Team: Blane Ohara, MD as PCP - General (Family Medicine) Drue Second, MD as Referring Physician (Oncology)  Indicate any recent Medical Services you may have received from other than [facility] providers in the past year (date may be approximate).     Assessment:   This is a routine wellness examination for Marilin.  Hearing/Vision screen Hearing Screening - Comments:: Some hearing loss; followed by audiology  Vision Screening - Comments:: No vision problems; will schedule routine eye exam soon     Goals Addressed             This Visit's Progress    Remain active and independent         Depression Screen     07/09/2023    3:40 PM 05/05/2023    8:28 AM 01/19/2023    7:42 AM 09/29/2022     7:34 AM 09/02/2022    1:23 PM 05/27/2022    2:11 PM 12/27/2020   10:30 AM  PHQ 2/9 Scores  PHQ - 2 Score 0 0 0 0 0 0 0  PHQ- 9 Score  2 0 0       Fall Risk     07/09/2023    3:41 PM 05/05/2023    8:29 AM 01/19/2023    7:42 AM 09/29/2022    7:34 AM 09/02/2022    1:23 PM  Fall Risk   Falls in the past year? 0 0 0 0 0  Number falls in past yr: 0 0 0 0 0  Injury with Fall? 0 0 0 0 0  Risk for fall due to : No Fall Risks No Fall Risks No Fall Risks No Fall Risks No Fall Risks  Follow up Falls prevention discussed;Education provided;Falls evaluation completed Falls evaluation completed Falls evaluation completed;Falls prevention discussed Falls evaluation completed;Falls prevention discussed Falls evaluation completed;Falls prevention discussed    MEDICARE RISK AT HOME:  Medicare Risk at Home Any stairs in or around the home?: No If so, are there any without handrails?: No Home free of loose throw rugs in walkways, pet beds, electrical cords, etc?: Yes Adequate lighting in your home to reduce risk of falls?: Yes Life alert?: No Use of a cane, walker or w/c?: No Grab bars in the bathroom?: Yes Shower chair or bench in shower?: No Elevated toilet seat or a handicapped toilet?: Yes  TIMED UP AND GO:  Was the test performed?  No  Cognitive Function: 6CIT completed        07/09/2023    3:42 PM 05/27/2022    2:19 PM 12/27/2020   10:32 AM  6CIT Screen  What Year? 0 points 0 points 0 points  What month? 0 points 0 points 0 points  What time? 0 points 0 points 0 points  Count back from 20 0 points 0 points 0 points  Months in reverse 0 points 0 points 0 points  Repeat phrase 0  points 0 points 2 points  Total Score 0 points 0 points 2 points    Immunizations Immunization History  Administered Date(s) Administered   Fluad Quad(high Dose 65+) 12/26/2019, 12/25/2020, 12/10/2021   Fluad Trivalent(High Dose 65+) 01/19/2023   Moderna Covid-19 Vaccine Bivalent Booster 21yrs & up  01/21/2021   Moderna SARS-COV2 Booster Vaccination 08/13/2020   Moderna Sars-Covid-2 Vaccination 05/14/2019, 06/11/2019, 01/23/2020   Pneumococcal Conjugate-13 01/31/2015   Pneumococcal Polysaccharide-23 01/06/2013   Respiratory Syncytial Virus Vaccine,Recomb Aduvanted(Arexvy) 03/03/2022    Screening Tests Health Maintenance  Topic Date Due   Hepatitis C Screening  Never done   DTaP/Tdap/Td (1 - Tdap) Never done   Zoster Vaccines- Shingrix (1 of 2) Never done   COVID-19 Vaccine (5 - 2024-25 season) 11/30/2022   INFLUENZA VACCINE  10/30/2023   Pneumonia Vaccine 11+ Years old  Completed   DEXA SCAN  Completed   HPV VACCINES  Aged Out   Meningococcal B Vaccine  Aged Out   Colonoscopy  Discontinued    Health Maintenance  Health Maintenance Due  Topic Date Due   Hepatitis C Screening  Never done   DTaP/Tdap/Td (1 - Tdap) Never done   Zoster Vaccines- Shingrix (1 of 2) Never done   COVID-19 Vaccine (5 - 2024-25 season) 11/30/2022    Additional Screening:  Vision Screening: Recommended annual ophthalmology exams for early detection of glaucoma and other disorders of the eye.  Dental Screening: Recommended annual dental exams for proper oral hygiene  Community Resource Referral / Chronic Care Management: CRR required this visit?  No   CCM required this visit?  No     Plan:     I have personally reviewed and noted the following in the patient's chart:   Medical and social history Use of alcohol, tobacco or illicit drugs  Current medications and supplements including opioid prescriptions. Patient is not currently taking opioid prescriptions. Functional ability and status Nutritional status Physical activity Advanced directives List of other physicians Hospitalizations, surgeries, and ER visits in previous 12 months Vitals Screenings to include cognitive, depression, and falls Referrals and appointments  In addition, I have reviewed and discussed with patient  certain preventive protocols, quality metrics, and best practice recommendations. A written personalized care plan for preventive services as well as general preventive health recommendations were provided to patient.     Kandis Fantasia [place], [place]   07/01/4740   After Visit Summary: (MyChart) Due to this being a telephonic visit, the after visit summary with patients personalized plan was offered to patient via MyChart   Notes: Nothing significant to report at this time.

## 2023-07-09 NOTE — Patient Instructions (Signed)
 Ms. Toste , Thank you for taking time to come for your Medicare Wellness Visit. I appreciate your ongoing commitment to your health goals. Please review the following plan we discussed and let me know if I can assist you in the future.   Referrals/Orders/Follow-Ups/Clinician Recommendations: Aim for 30 minutes of exercise or brisk walking, 6-8 glasses of water, and 5 servings of fruits and vegetables each day.  This is a list of the screening recommended for you and due dates:  Health Maintenance  Topic Date Due   Hepatitis C Screening  Never done   DTaP/Tdap/Td vaccine (1 - Tdap) Never done   Zoster (Shingles) Vaccine (1 of 2) Never done   COVID-19 Vaccine (5 - 2024-25 season) 11/30/2022   Flu Shot  10/30/2023   Pneumonia Vaccine  Completed   DEXA scan (bone density measurement)  Completed   HPV Vaccine  Aged Out   Meningitis B Vaccine  Aged Out   Colon Cancer Screening  Discontinued    Advanced directives: (ACP Link)Information on Advanced Care Planning can be found at [facility] Advance Health Care Directives Advance Health Care Directives. [email or web address]   Next Medicare Annual Wellness Visit scheduled for next year: Yes

## 2023-07-24 ENCOUNTER — Other Ambulatory Visit: Payer: Self-pay | Admitting: Family Medicine

## 2023-08-03 ENCOUNTER — Other Ambulatory Visit: Payer: Self-pay | Admitting: Family Medicine

## 2023-09-01 ENCOUNTER — Encounter: Payer: Self-pay | Admitting: Family Medicine

## 2023-09-01 ENCOUNTER — Ambulatory Visit (INDEPENDENT_AMBULATORY_CARE_PROVIDER_SITE_OTHER): Payer: Medicare HMO | Admitting: Family Medicine

## 2023-09-01 VITALS — BP 124/70 | HR 63 | Temp 98.3°F | Ht 66.0 in | Wt 158.0 lb

## 2023-09-01 DIAGNOSIS — D6861 Antiphospholipid syndrome: Secondary | ICD-10-CM | POA: Diagnosis not present

## 2023-09-01 DIAGNOSIS — R7303 Prediabetes: Secondary | ICD-10-CM

## 2023-09-01 DIAGNOSIS — J301 Allergic rhinitis due to pollen: Secondary | ICD-10-CM

## 2023-09-01 DIAGNOSIS — Z23 Encounter for immunization: Secondary | ICD-10-CM

## 2023-09-01 DIAGNOSIS — E782 Mixed hyperlipidemia: Secondary | ICD-10-CM

## 2023-09-01 DIAGNOSIS — N1832 Chronic kidney disease, stage 3b: Secondary | ICD-10-CM

## 2023-09-01 DIAGNOSIS — R300 Dysuria: Secondary | ICD-10-CM | POA: Diagnosis not present

## 2023-09-01 DIAGNOSIS — Z1159 Encounter for screening for other viral diseases: Secondary | ICD-10-CM | POA: Insufficient documentation

## 2023-09-01 DIAGNOSIS — I129 Hypertensive chronic kidney disease with stage 1 through stage 4 chronic kidney disease, or unspecified chronic kidney disease: Secondary | ICD-10-CM

## 2023-09-01 DIAGNOSIS — E038 Other specified hypothyroidism: Secondary | ICD-10-CM | POA: Diagnosis not present

## 2023-09-01 LAB — COMPREHENSIVE METABOLIC PANEL WITH GFR
ALT: 9 [IU]/L (ref 0–32)
AST: 19 [IU]/L (ref 0–40)
Albumin: 4.1 g/dL (ref 3.8–4.8)
Alkaline Phosphatase: 99 [IU]/L (ref 44–121)
BUN/Creatinine Ratio: 17 (ref 12–28)
BUN: 28 mg/dL — ABNORMAL HIGH (ref 8–27)
Bilirubin Total: 0.8 mg/dL (ref 0.0–1.2)
CO2: 17 mmol/L — ABNORMAL LOW (ref 20–29)
Calcium: 8.3 mg/dL — ABNORMAL LOW (ref 8.7–10.3)
Chloride: 109 mmol/L — ABNORMAL HIGH (ref 96–106)
Creatinine, Ser: 1.65 mg/dL — ABNORMAL HIGH (ref 0.57–1.00)
Globulin, Total: 2 g/dL (ref 1.5–4.5)
Glucose: 95 mg/dL (ref 70–99)
Potassium: 4.7 mmol/L (ref 3.5–5.2)
Sodium: 141 mmol/L (ref 134–144)
Total Protein: 6.1 g/dL (ref 6.0–8.5)
eGFR: 32 mL/min/{1.73_m2} — ABNORMAL LOW

## 2023-09-01 LAB — CBC WITH DIFFERENTIAL/PLATELET
Basophils Absolute: 0.3 10*3/uL — ABNORMAL HIGH (ref 0.0–0.2)
Basos: 1 %
EOS (ABSOLUTE): 1.4 10*3/uL — ABNORMAL HIGH (ref 0.0–0.4)
Eos: 3 %
Hematocrit: 33.3 % — ABNORMAL LOW (ref 34.0–46.6)
Hemoglobin: 10.4 g/dL — ABNORMAL LOW (ref 11.1–15.9)
Immature Grans (Abs): 0 10*3/uL (ref 0.0–0.1)
Immature Granulocytes: 0 %
Lymphocytes Absolute: 32.6 10*3/uL — ABNORMAL HIGH (ref 0.7–3.1)
Lymphs: 74 %
MCH: 29.8 pg (ref 26.6–33.0)
MCHC: 31.2 g/dL — ABNORMAL LOW (ref 31.5–35.7)
MCV: 95 fL (ref 79–97)
Monocytes Absolute: 2.9 10*3/uL — ABNORMAL HIGH (ref 0.1–0.9)
Monocytes: 7 %
Neutrophils Absolute: 6.5 10*3/uL (ref 1.4–7.0)
Neutrophils: 15 %
Platelets: 260 10*3/uL (ref 150–450)
RBC: 3.49 x10E6/uL — ABNORMAL LOW (ref 3.77–5.28)
RDW: 16.5 % — ABNORMAL HIGH (ref 11.7–15.4)
WBC: 43.8 10*3/uL (ref 3.4–10.8)

## 2023-09-01 LAB — POCT URINALYSIS DIP (CLINITEK)
Bilirubin, UA: NEGATIVE
Blood, UA: NEGATIVE
Glucose, UA: NEGATIVE mg/dL
Ketones, POC UA: NEGATIVE mg/dL
Nitrite, UA: NEGATIVE
POC PROTEIN,UA: NEGATIVE
Spec Grav, UA: 1.015
Urobilinogen, UA: 0.2 U/dL
pH, UA: 6

## 2023-09-01 LAB — TSH: TSH: 0.894 u[IU]/mL (ref 0.450–4.500)

## 2023-09-01 LAB — LIPID PANEL
Chol/HDL Ratio: 4.3 ratio (ref 0.0–4.4)
Cholesterol, Total: 134 mg/dL (ref 100–199)
HDL: 31 mg/dL — ABNORMAL LOW
LDL Chol Calc (NIH): 75 mg/dL (ref 0–99)
Triglycerides: 159 mg/dL — ABNORMAL HIGH (ref 0–149)
VLDL Cholesterol Cal: 28 mg/dL (ref 5–40)

## 2023-09-01 NOTE — Progress Notes (Signed)
 Subjective:  Patient ID: Cynthia Gardner, female    DOB: 1945/06/29  Age: 78 y.o. MRN: [id number]  Chief Complaint  Patient presents with   Medical Management of Chronic Issues    HPI:  Hypothyroidism: Levothyroxine  75 mcg daily.   Hypertension: Current medications: Patient is on Chlorthalidone  25 mg everyday, clonidine 0.3 mg 1/2 IN AM AND ONE AT NIGHT, Metoprolol  50 mg twice daily.  Patient is also on potassium 10 mEq for daily.    Antiphospholipid syndrome/paroxysmal atrial fib:  Eliquis  5 mg twice a day.   CLL: sees oncology. Follows up yearly.  Dr Meredeth Stallion.   Allergic rhinitis currently on Zyrtec and Flonase .  Patient does complain of some hearing issues but has not had time to have her ears checked.  Diet: healthy Exercise:  no  Sinus symptoms x 2-3 days. Pressure, ears hurts, nasal congestion. Taking claritin.       07/09/2023    3:40 PM 05/05/2023    8:28 AM 01/19/2023    7:42 AM 09/29/2022    7:34 AM 09/02/2022    1:23 PM  Depression screen PHQ 2/9  Decreased Interest 0 0 0 0 0  Down, Depressed, Hopeless 0 0 0 0 0  PHQ - 2 Score 0 0 0 0 0  Altered sleeping  1 0 0   Tired, decreased energy  1 0 0   Change in appetite  0 0 0   Feeling bad or failure about yourself   0 0 0   Trouble concentrating   0 0   Moving slowly or fidgety/restless  0 0 0   Suicidal thoughts  0 0 0   PHQ-9 Score  2 0 0   Difficult doing work/chores  Not difficult at all Not difficult at all Not difficult at all         07/09/2023    3:41 PM  Fall Risk   Falls in the past year? 0  Number falls in past yr: 0  Injury with Fall? 0  Risk for fall due to : No Fall Risks  Follow up Falls prevention discussed;Education provided;Falls evaluation completed    Patient Care Team: Mercy Stall, MD as PCP - General (Family Medicine) Alethia Huxley, MD as Referring Physician (Oncology)   Review of Systems  Constitutional:  Negative for chills, fatigue and fever.  HENT:  Positive for congestion,  rhinorrhea, sinus pressure and sinus pain. Negative for ear pain and sore throat.   Respiratory:  Negative for cough and shortness of breath.   Cardiovascular:  Negative for chest pain.  Gastrointestinal:  Negative for abdominal pain, constipation, diarrhea, nausea and vomiting.  Genitourinary:  Positive for dysuria. Negative for urgency.  Musculoskeletal:  Negative for back pain and myalgias.  Neurological:  Negative for dizziness, weakness, light-headedness and headaches.  Psychiatric/Behavioral:  Negative for dysphoric mood. The patient is not nervous/anxious.     Current Outpatient Medications on File Prior to Visit  Medication Sig Dispense Refill   cetirizine (ZYRTEC) 10 MG tablet TAKE 1 TABLET BY MOUTH EVERY DAY 90 tablet 3   chlorthalidone  (HYGROTON ) 25 MG tablet Take 1 tablet (25 mg total) by mouth daily. 90 tablet 1   Cholecalciferol (VITAMIN D3 PO) Take 1 tablet by mouth daily.     cloNIDine (CATAPRES) 0.3 MG tablet TAKE 1 TABLET BY MOUTH TWICE A DAY 180 tablet 0   [facility] CHEWABLE C WITH ROSE HIPS 500 MG CHEW TAKE 1 TABLET BY MOUTH EVERY DAY 100 tablet 1  diclofenac  Sodium (VOLTAREN ) 1 % GEL Apply four times a day to hip. 1000 g 1   diphenoxylate -atropine  (LOMOTIL ) 2.5-0.025 MG tablet Take 1 tablet by mouth 4 (four) times daily as needed for diarrhea or loose stools. 60 tablet 2   ELIQUIS  5 MG TABS tablet TAKE 1 TABLET BY MOUTH TWICE A DAY 60 tablet 5   fluticasone  (FLONASE ) 50 MCG/ACT nasal spray SPRAY 2 SPRAYS INTO EACH NOSTRIL EVERY DAY 48 mL 5   folic acid  (FOLVITE ) 1 MG tablet TAKE 1 TABLET BY MOUTH EVERY DAY 90 tablet 1   levothyroxine  (SYNTHROID ) 75 MCG tablet TAKE 1 TABLET BY MOUTH EVERY DAY BEFORE BREAKFAST 90 tablet 1   metoprolol  tartrate (LOPRESSOR ) 50 MG tablet TAKE 1 TABLET BY MOUTH TWICE A DAY. PT NEEDS TO CALL TO RESCHEDULE FASTING APPT. 180 tablet 1   Multiple Vitamins-Minerals (HAIR SKIN AND NAILS FORMULA) TABS Take by mouth.     Multiple Vitamins-Minerals (ZINC  PO)  Take 1 tablet by mouth daily.     potassium chloride  (MICRO-K ) 10 MEQ CR capsule TAKE 4 CAPSULES BY MOUTH EVERY DAY 360 capsule 1   Vitamin D , Ergocalciferol , (DRISDOL ) 1.25 MG (50000 UNIT) CAPS capsule TAKE 1 CAPSULE BY MOUTH ONE TIME PER WEEK 12 capsule 1   Zinc  Sulfate (ZINC -220 PO) Take by mouth.     No current facility-administered medications on file prior to visit.   Past Medical History:  Diagnosis Date   Antiphospholipid antibody syndrome (HCC)    CVA (cerebral vascular accident) (HCC) 04/06/2019   History of COVID-19    HTN (hypertension) 04/06/2019   Hypothyroidism 04/06/2019   Leukemia (HCC)    Past Surgical History:  Procedure Laterality Date   APPENDECTOMY     CATARACT EXTRACTION Right    COLON RESECTION  2008   ischemic bowel   TONSILLECTOMY      Family History  Problem Relation Age of Onset   Parkinson's disease Father    Cirrhosis Brother        from agent orange   Hypertension Other    Diabetes Other    Social History   Socioeconomic History   Marital status: Married    Spouse name: Charles   Number of children: 2   Years of education: Not on file   Highest education level: Not on file  Occupational History   Not on file  Tobacco Use   Smoking status: Former   Smokeless tobacco: Never  Vaping Use   Vaping status: Never Used  Substance and Sexual Activity   Alcohol use: Not Currently   Drug use: Never   Sexual activity: Not Currently    Birth control/protection: None  Other Topics Concern   Not on file  Social History Narrative   Not on file   Social Drivers of Health   Financial Resource Strain: Low Risk  (07/09/2023)   Overall Financial Resource Strain (CARDIA)    Difficulty of Paying Living Expenses: Not hard at all  Food Insecurity: No Food Insecurity (07/09/2023)   Hunger Vital Sign    Worried About Running Out of Food in the Last Year: Never true    Ran Out of Food in the Last Year: Never true  Transportation Needs: No Transportation  Needs (07/09/2023)   PRAPARE - Administrator, Civil Service (Medical): No    Lack of Transportation (Non-Medical): No  Physical Activity: Insufficiently Active (07/09/2023)   Exercise Vital Sign    Days of Exercise per Week: 3 days  Minutes of Exercise per Session: 30 min  Stress: No Stress Concern Present (07/09/2023)   [employer] of Occupational Health - Occupational Stress Questionnaire    Feeling of Stress : Not at all  Social Connections: Moderately Integrated (07/09/2023)   Social Connection and Isolation Panel [NHANES]    Frequency of Communication with Friends and Family: More than three times a week    Frequency of Social Gatherings with Friends and Family: Three times a week    Attends Religious Services: More than 4 times per year    Active Member of Clubs or Organizations: No    Attends Banker Meetings: Never    Marital Status: Married    Objective:  BP 124/70   Pulse 63   Temp 98.3 F (36.8 C)   Ht 5\' 6"  (1.676 m)   Wt 158 lb (71.7 kg)   SpO2 92%   BMI 25.50 kg/m      09/01/2023    7:37 AM 07/09/2023    3:33 PM 05/05/2023    8:25 AM  BP/Weight  Systolic BP 124 -- 104  Diastolic BP 70 -- 74  Wt. (Lbs) 158 162 162.8  BMI 25.5 kg/m2 26.15 kg/m2 26.28 kg/m2    Physical Exam Vitals reviewed.  Constitutional:      Appearance: Normal appearance.  HENT:     Nose: Congestion and rhinorrhea (PALE SWOLLEN TURBINATES. NONTENDER SINUSES.) present.     Mouth/Throat:     Pharynx: Oropharynx is clear.  Cardiovascular:     Rate and Rhythm: Normal rate and regular rhythm.     Heart sounds: Normal heart sounds. No murmur heard. Pulmonary:     Effort: Pulmonary effort is normal. No respiratory distress.     Breath sounds: Normal breath sounds.  Abdominal:     General: Bowel sounds are normal.     Palpations: Abdomen is soft.     Tenderness: There is no abdominal tenderness.  Lymphadenopathy:     Cervical: No cervical adenopathy.   Neurological:     Mental Status: She is alert and oriented to person, place, and time.  Psychiatric:        Mood and Affect: Mood normal.        Behavior: Behavior normal.     Diabetic Foot Exam - Simple   No data filed      Lab Results  Component Value Date   WBC 43.8 (HH) 09/01/2023   HGB 10.4 (L) 09/01/2023   HCT 33.3 (L) 09/01/2023   PLT 260 09/01/2023   GLUCOSE 95 09/01/2023   CHOL 134 09/01/2023   TRIG 159 (H) 09/01/2023   HDL 31 (L) 09/01/2023   LDLCALC 75 09/01/2023   ALT 9 09/01/2023   AST 19 09/01/2023   NA 141 09/01/2023   K 4.7 09/01/2023   CL 109 (H) 09/01/2023   CREATININE 1.65 (H) 09/01/2023   BUN 28 (H) 09/01/2023   CO2 17 (L) 09/01/2023   TSH 0.894 09/01/2023   HGBA1C 5.2 05/05/2023      Assessment & Plan:  Other specified hypothyroidism Assessment & Plan: Previously well controlled Continue Synthroid  at current dose  Recheck TSH and adjust Synthroid  as indicated    Orders: -     TSH  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  No changes to medicines. Chlorthalidone  25 mg everyday, clonidine 0.3 mg 1/2 IN AM AND ONE AT NIGHT, Metoprolol  50 mg twice daily.  Continue to work on  eating a healthy diet and exercise.  Labs drawn today.    Orders: -     CBC with Differential/Platelet -     Comprehensive metabolic panel with GFR  Antiphospholipid syndrome (HCC) Assessment & Plan: Continue eliquis    Prediabetes Assessment & Plan: Recommend continue to work on eating healthy diet and exercise.    Mixed hyperlipidemia Assessment & Plan: Recommend continue to work on eating healthy diet and exercise.   Orders: -     Lipid panel  Need for hepatitis C screening test -     HCV Ab w Reflex to Quant PCR  Dysuria Assessment & Plan: Check UA Order urine culture  Orders: -     POCT URINALYSIS DIP (CLINITEK) -     Urine Culture -     Nitrofurantoin Monohyd Macro; Take  1 capsule (100 mg total) by mouth 2 (two) times daily.  Dispense: 14 capsule; Refill: 0  Immunization due -     Pneumococcal conjugate vaccine 20-valent  Seasonal allergic rhinitis due to pollen Assessment & Plan: Recommend start on claritin 10 mg daily.       Meds ordered this encounter  Medications   nitrofurantoin, macrocrystal-monohydrate, (MACROBID) 100 MG capsule    Sig: Take 1 capsule (100 mg total) by mouth 2 (two) times daily.    Dispense:  14 capsule    Refill:  0    Orders Placed This Encounter  Procedures   Urine Culture   Pneumococcal conjugate vaccine 20-valent   CBC with Differential/Platelet   Comprehensive metabolic panel with GFR   Lipid panel   TSH   HCV Ab w Reflex to Quant PCR   POCT URINALYSIS DIP (CLINITEK)     Follow-up: Return in about 3 months (around 12/02/2023) for chronic follow up.   I,Marla I Leal-Borjas,acting as a scribe for Mercy Stall, MD.,have documented all relevant documentation on the behalf of Mercy Stall, MD,as directed by  Mercy Stall, MD while in the presence of Mercy Stall, MD.   An After Visit Summary was printed and given to the patient.  I attest that I have reviewed this visit and agree with the plan scribed by my staff.  Mercy Stall, MD [facility] [phone]

## 2023-09-01 NOTE — Patient Instructions (Signed)
 Recommend shingles vaccine series and tetanus (tdap) vaccine.

## 2023-09-02 ENCOUNTER — Ambulatory Visit: Payer: Self-pay | Admitting: Family Medicine

## 2023-09-02 ENCOUNTER — Telehealth: Payer: Self-pay

## 2023-09-02 LAB — HCV AB W REFLEX TO QUANT PCR: HCV Ab: NONREACTIVE

## 2023-09-02 LAB — HCV INTERPRETATION

## 2023-09-02 NOTE — Telephone Encounter (Signed)
 Dr Reinhold Carbine was notified.  Copied from CRM [id number]. Topic: Clinical - Lab/Test Results >> Sep 02, 2023 11:58 AM Lotus Round B wrote: Reason for CRM: Sarah from [employer] called in with a LAB ALERT . Pt WBC was 43.8 .

## 2023-09-06 DIAGNOSIS — J301 Allergic rhinitis due to pollen: Secondary | ICD-10-CM | POA: Insufficient documentation

## 2023-09-06 DIAGNOSIS — Z23 Encounter for immunization: Secondary | ICD-10-CM | POA: Insufficient documentation

## 2023-09-06 LAB — URINE CULTURE

## 2023-09-06 MED ORDER — NITROFURANTOIN MONOHYD MACRO 100 MG PO CAPS: 100.0000 mg | ORAL_CAPSULE | Freq: Two times a day (BID) | ORAL | 0 refills | Status: AC

## 2023-09-06 NOTE — Assessment & Plan Note (Signed)
 Recommend continue to work on eating healthy diet and exercise.

## 2023-09-06 NOTE — Assessment & Plan Note (Signed)
 Check UA Order urine culture

## 2023-09-06 NOTE — Assessment & Plan Note (Signed)
 Well controlled.  No changes to medicines. Chlorthalidone  25 mg everyday, clonidine 0.3 mg 1/2 IN AM AND ONE AT NIGHT, Metoprolol  50 mg twice daily.  Continue to work on eating a healthy diet and exercise.  Labs drawn today.

## 2023-09-06 NOTE — Assessment & Plan Note (Signed)
 Continue eliquis  ?

## 2023-09-06 NOTE — Assessment & Plan Note (Signed)
 Recommend start on claritin 10 mg daily.

## 2023-09-06 NOTE — Assessment & Plan Note (Signed)
 Previously well controlled Continue Synthroid at current dose  Recheck TSH and adjust Synthroid as indicated

## 2023-09-11 ENCOUNTER — Other Ambulatory Visit: Payer: Self-pay | Admitting: Family Medicine

## 2023-09-12 ENCOUNTER — Other Ambulatory Visit: Payer: Self-pay | Admitting: Family Medicine

## 2023-09-25 ENCOUNTER — Ambulatory Visit (HOSPITAL_BASED_OUTPATIENT_CLINIC_OR_DEPARTMENT_OTHER)
Admission: EM | Admit: 2023-09-25 | Discharge: 2023-09-25 | Disposition: A | Discharge status: Home / Self Care | Attending: Family Medicine | Admitting: Family Medicine

## 2023-09-25 ENCOUNTER — Other Ambulatory Visit (HOSPITAL_BASED_OUTPATIENT_CLINIC_OR_DEPARTMENT_OTHER): Payer: Self-pay

## 2023-09-25 ENCOUNTER — Encounter (HOSPITAL_BASED_OUTPATIENT_CLINIC_OR_DEPARTMENT_OTHER): Payer: Self-pay

## 2023-09-25 DIAGNOSIS — M5442 Lumbago with sciatica, left side: Secondary | ICD-10-CM | POA: Diagnosis not present

## 2023-09-25 DIAGNOSIS — B0229 Other postherpetic nervous system involvement: Secondary | ICD-10-CM

## 2023-09-25 DIAGNOSIS — B029 Zoster without complications: Secondary | ICD-10-CM | POA: Diagnosis not present

## 2023-09-25 LAB — POCT URINALYSIS DIP (MANUAL ENTRY)
Bilirubin, UA: NEGATIVE
Glucose, UA: NEGATIVE mg/dL
Ketones, POC UA: NEGATIVE mg/dL
Leukocytes, UA: NEGATIVE
Nitrite, UA: NEGATIVE
Protein Ur, POC: NEGATIVE mg/dL
Spec Grav, UA: 1.02
Urobilinogen, UA: 0.2 U/dL
pH, UA: 5

## 2023-09-25 MED ORDER — PREDNISONE 20 MG PO TABS
20.0000 mg | ORAL_TABLET | Freq: Every day | ORAL | 0 refills | Status: AC
  Filled 2023-09-25: qty 5, 5d supply, fill #0

## 2023-09-25 MED ORDER — GABAPENTIN 100 MG PO CAPS
100.0000 mg | ORAL_CAPSULE | Freq: Three times a day (TID) | ORAL | 0 refills | Status: DC | PRN
  Filled 2023-09-25: qty 180, 20d supply, fill #0

## 2023-09-25 MED ORDER — VALACYCLOVIR HCL 1 G PO TABS
1000.0000 mg | ORAL_TABLET | Freq: Three times a day (TID) | ORAL | 0 refills | Status: AC
  Filled 2023-09-25: qty 21, 7d supply, fill #0

## 2023-09-25 NOTE — ED Triage Notes (Signed)
 Pt c/o left flank pain starting two days ago. She states the pain described as soreness and is worst when she is laying down and it radiates to the LLQ. She has taken hydrocodone prn with relief. Pt reports dysuria as well. She was treated for UTI earlier in the month and is not sure if the pain is due to UTI or kidney stone. Pt denies hx of kidney stones.

## 2023-09-25 NOTE — Discharge Instructions (Addendum)
 History and exam are consistent with shingles with early pain secondary to the shingles.  Valacyclovir 1000 mg 1 pill every 8 hours for 7 days or 21 pills.  Prednisone 20 mg 1 pill daily in the morning.  Gabapentin 100 mg, #1-3 pills, every 8 hours if needed for pain.  Provided education about shingles with a handout and verbal discussion.  Encouraged to stay home for 4 to 7 days.  She is contagious to other people to give them chickenpox as long as she has blisters that have not dried and crusted.  Caution to especially be careful to avoid children under 5 and anyone who is pregnant.  Follow-up if symptoms do not improve, worsen or new symptoms occur.
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
## 2023-09-30 ENCOUNTER — Ambulatory Visit: Payer: Self-pay

## 2023-09-30 NOTE — Telephone Encounter (Signed)
 FYI Only or Action Required?: FYI only for provider.  Patient was last seen in primary care on 09/01/2023 by Sherre Clapper, MD. Called Nurse Triage reporting No chief complaint on file.. Symptoms began today. Interventions attempted: Prescription medications: see note. Symptoms are: stable.  Triage Disposition: No disposition on file. See note   Patient/caregiver understands and will follow disposition?: yes   Copied from CRM [id number]. Topic: Clinical - Medication Question >> Sep 30, 2023 10:27 AM Everette C wrote: Reason for CRM: The patient shares that they have been seen and treated for shingles over the weekend at the [facility] in [place] on 08/2723. The patient would like to further discuss their new prescriptions with a member of clinical staff when possible Answer Assessment - Initial Assessment Questions 1. REASON FOR CALL or QUESTION:   Patient reports her Shingles started on her L. Lower back 09/25/23, but she notes some new blisters appeared the next day.  Patient wanted to verify that the medications she was prescribed at [facility] are the correct dosage.  Confirms that the new blisters are only a few, have stayed on the Left side of her body and have not crossed the midline of her body.     This nurse: -Confirmed pt is taking medication correctly and as prescribed. -Educated pt that it is common for new lesions to appear for the first few days even after medications have been initiated.  The full effect of antivirals takes a little time.    Patient educated on pertinent s/s that would warrant  for her to go back to UC or f/u with her PCP.  Patient verbalized understanding and agrees with plan No additional questions/concerns noted during the time of the call.  Protocols used: Information Only Call - No Triage-A-AH

## 2023-10-03 ENCOUNTER — Other Ambulatory Visit: Payer: Self-pay | Admitting: Family Medicine

## 2023-10-05 NOTE — Telephone Encounter (Signed)
 Copied from CRM [id number]. Topic: Clinical - Medical Advice >> Oct 05, 2023  1:46 PM Essie A wrote: Reason for CRM: Patient has the shingles.  She wants to know what type of cream or lotion should she use.  Please return her call at [phone].

## 2023-10-06 NOTE — Telephone Encounter (Signed)
 Patient called back again regarding her shingles.  She hadn't heard from the doctor.  I read the message to her.  She wanted to know if a prescription could be written for the medication.  I called to office and they said it could only be gotten over the counter.  She will look for it in the store.

## 2023-10-19 ENCOUNTER — Other Ambulatory Visit (HOSPITAL_BASED_OUTPATIENT_CLINIC_OR_DEPARTMENT_OTHER): Payer: Self-pay

## 2023-10-19 ENCOUNTER — Telehealth: Payer: Self-pay

## 2023-10-19 ENCOUNTER — Other Ambulatory Visit: Payer: Self-pay | Admitting: Family Medicine

## 2023-10-19 MED ORDER — GABAPENTIN 300 MG PO CAPS
300.0000 mg | ORAL_CAPSULE | Freq: Three times a day (TID) | ORAL | 1 refills | Status: DC
  Filled 2023-10-19: qty 90, 30d supply, fill #0

## 2023-10-19 NOTE — Telephone Encounter (Signed)
 Called patient and made her aware of information below:  Patient was seen in late June for Shingles. She is almost out of Gabapentin  that I gave her. I gave her 100mg , use #1-3 pills Q8H. She needs refills and asked about getting the 300mg  pills. I explained that we transition to PCPs for refills and do not provide long term management.   Camie Settler Could you send in some Gabapentin  for her? She was fine with it going to this pharmacy or her regular pharmacy. Please let me know if you are willing and we will reach back out to her, unless your team will also call her. I advised that she might need to see you about the shingles for a follow-up. Thank you.  17 mins KC Abigail Free, MD I can send her the prescription. We will call her and get her scheduled for follow up unless she refuses.   15 mins You were added by Abigail Free, MD. 13 mins [facility] Abigail Free, MD Sent to med center. Lenette Rau please call patient and make a telephone note. thank you.   13 mins KC SJ Lauraine Settler, FNP thank you so much!

## 2023-10-22 ENCOUNTER — Other Ambulatory Visit: Payer: Self-pay | Admitting: Family Medicine

## 2023-10-26 ENCOUNTER — Other Ambulatory Visit: Payer: Self-pay | Admitting: Family Medicine

## 2023-10-26 MED ORDER — LEVOTHYROXINE SODIUM 75 MCG PO TABS: 75.0000 ug | ORAL_TABLET | Freq: Every day | ORAL | 3 refills | Status: AC

## 2023-10-26 NOTE — Telephone Encounter (Signed)
 Copied from CRM [id number]. Topic: Clinical - Medication Refill >> Oct 26, 2023 11:53 AM Essie A wrote: Medication: levothyroxine  (SYNTHROID ) 75 MCG tablet  Has the patient contacted their pharmacy? Yes (Agent: If no, request that the patient contact the pharmacy for the refill. If patient does not wish to contact the pharmacy document the reason why and proceed with request.) (Agent: If yes, when and what did the pharmacy advise?)  This is the patient's preferred pharmacy:  [facility] [address] [place] [facility] [address] [place] [address] Phone: [phone] Fax: [phone]  Is this the correct pharmacy for this prescription? Yes If no, delete pharmacy and type the correct one.   Has the prescription been filled recently? Yes  Is the patient out of the medication? Yes  Has the patient been seen for an appointment in the last year OR does the patient have an upcoming appointment? Yes  Can we respond through MyChart? Yes  Agent: Please be advised that Rx refills may take up to 3 business days. We ask that you follow-up with your pharmacy.

## 2023-10-27 ENCOUNTER — Ambulatory Visit: Admitting: Family Medicine

## 2023-10-27 VITALS — BP 104/64 | HR 73 | Temp 98.2°F | Ht 66.0 in | Wt 154.0 lb

## 2023-10-27 DIAGNOSIS — B0229 Other postherpetic nervous system involvement: Secondary | ICD-10-CM | POA: Diagnosis not present

## 2023-10-27 MED ORDER — LIDOCAINE 5 % EX PTCH: 3.0000 | MEDICATED_PATCH | CUTANEOUS | 2 refills | Status: AC

## 2023-10-27 NOTE — Progress Notes (Signed)
 "  Subjective:  Patient ID: Cynthia Gardner, female    DOB: 1946-02-13  Age: 78 y.o. MRN: [id number]  Chief Complaint  Patient presents with   Herpes Zoster    HPI: Presents for follow up shingles started on left back and is on her left abdomen--burning, stinging and when clothes touch feels like fire. Gabapentin  has helped, but not enough. Had shingles diagnosed 3-4 weeks ago at the [facility]. Unable to take nonsteroid antiinflammatories, such as naproxen (aleve) or ibuprofen (advil.) due to eliquis . Tylenol  does not help.      07/09/2023    3:40 PM 05/05/2023    8:28 AM 01/19/2023    7:42 AM 09/29/2022    7:34 AM 09/02/2022    1:23 PM  Depression screen PHQ 2/9  Decreased Interest 0 0 0 0 0  Down, Depressed, Hopeless 0 0 0 0 0  PHQ - 2 Score 0 0 0 0 0  Altered sleeping  1 0 0   Tired, decreased energy  1 0 0   Change in appetite  0 0 0   Feeling bad or failure about yourself   0 0 0   Trouble concentrating   0 0   Moving slowly or fidgety/restless  0 0 0   Suicidal thoughts  0 0 0   PHQ-9 Score  2 0 0   Difficult doing work/chores  Not difficult at all Not difficult at all Not difficult at all         07/09/2023    3:41 PM  Fall Risk   Falls in the past year? 0  Number falls in past yr: 0  Injury with Fall? 0  Risk for fall due to : No Fall Risks  Follow up Falls prevention discussed;Education provided;Falls evaluation completed    Patient Care Team: Sherre Clapper, MD as PCP - General (Family Medicine) Fernand Evans, MD as Referring Physician (Oncology)   Review of Systems  Constitutional:  Negative for chills, fatigue and fever.  HENT:  Negative for congestion, ear pain, rhinorrhea and sore throat.   Respiratory:  Negative for cough and shortness of breath.   Cardiovascular:  Negative for chest pain.  Gastrointestinal:  Negative for abdominal pain, constipation, diarrhea, nausea and vomiting.  Genitourinary:  Negative for dysuria and urgency.  Musculoskeletal:  Negative for back  pain and myalgias.  Skin:  Positive for rash.  Neurological:  Negative for dizziness, weakness, light-headedness and headaches.  Psychiatric/Behavioral:  Negative for dysphoric mood. The patient is not nervous/anxious.     Current Outpatient Medications on File Prior to Visit  Medication Sig Dispense Refill   apixaban  (ELIQUIS ) 5 MG TABS tablet TAKE 1 TABLET BY MOUTH TWICE A DAY 60 tablet 2   cetirizine (ZYRTEC) 10 MG tablet TAKE 1 TABLET BY MOUTH EVERY DAY 90 tablet 3   chlorthalidone  (HYGROTON ) 25 MG tablet Take 1 tablet (25 mg total) by mouth daily. 90 tablet 1   Cholecalciferol (VITAMIN D3 PO) Take 1 tablet by mouth daily.     cloNIDine (CATAPRES) 0.3 MG tablet TAKE 1 TABLET BY MOUTH TWICE A DAY 180 tablet 0   [employer] CHEWABLE C WITH ROSE HIPS 500 MG CHEW TAKE 1 TABLET BY MOUTH EVERY DAY 100 tablet 1   diclofenac  Sodium (VOLTAREN ) 1 % GEL Apply four times a day to hip. 1000 g 1   diphenoxylate -atropine  (LOMOTIL ) 2.5-0.025 MG tablet Take 1 tablet by mouth 4 (four) times daily as needed for diarrhea or loose stools. 60 tablet 2  fluticasone  (FLONASE ) 50 MCG/ACT nasal spray SPRAY 2 SPRAYS INTO EACH NOSTRIL EVERY DAY 48 mL 5   folic acid  (FOLVITE ) 1 MG tablet TAKE 1 TABLET BY MOUTH EVERY DAY 90 tablet 1   levothyroxine  (SYNTHROID ) 75 MCG tablet Take 1 tablet (75 mcg total) by mouth daily before breakfast. 90 tablet 3   metoprolol  tartrate (LOPRESSOR ) 50 MG tablet TAKE 1 TABLET BY MOUTH TWICE A DAY. PT NEEDS TO CALL TO RESCHEDULE FASTING APPT. 180 tablet 1   Multiple Vitamins-Minerals (HAIR SKIN AND NAILS FORMULA) TABS Take by mouth.     Multiple Vitamins-Minerals (ZINC  PO) Take 1 tablet by mouth daily.     nitrofurantoin , macrocrystal-monohydrate, (MACROBID ) 100 MG capsule Take 1 capsule (100 mg total) by mouth 2 (two) times daily. 14 capsule 0   potassium chloride  (MICRO-K ) 10 MEQ CR capsule TAKE 4 CAPSULES BY MOUTH EVERY DAY 360 capsule 1   Vitamin D , Ergocalciferol , (DRISDOL ) 1.25 MG (50000  UNIT) CAPS capsule TAKE 1 CAPSULE BY MOUTH ONE TIME PER WEEK 12 capsule 1   Zinc  Sulfate (ZINC -220 PO) Take by mouth.     No current facility-administered medications on file prior to visit.   Past Medical History:  Diagnosis Date   Antiphospholipid antibody syndrome (HCC)    CVA (cerebral vascular accident) (HCC) 04/06/2019   History of COVID-19    HTN (hypertension) 04/06/2019   Hypothyroidism 04/06/2019   Leukemia (HCC)    Past Surgical History:  Procedure Laterality Date   APPENDECTOMY     CATARACT EXTRACTION Right    COLON RESECTION  2008   ischemic bowel   TONSILLECTOMY      Family History  Problem Relation Age of Onset   Parkinson's disease Father    Cirrhosis Brother        from agent orange   Hypertension Other    Diabetes Other    Social History   Socioeconomic History   Marital status: Married    Spouse name: Charles   Number of children: 2   Years of education: Not on file   Highest education level: Not on file  Occupational History   Not on file  Tobacco Use   Smoking status: Former   Smokeless tobacco: Never  Vaping Use   Vaping status: Never Used  Substance and Sexual Activity   Alcohol use: Not Currently   Drug use: Never   Sexual activity: Not Currently    Birth control/protection: None  Other Topics Concern   Not on file  Social History Narrative   Not on file   Social Drivers of Health   Financial Resource Strain: Low Risk  (07/09/2023)   Overall Financial Resource Strain (CARDIA)    Difficulty of Paying Living Expenses: Not hard at all  Food Insecurity: No Food Insecurity (07/09/2023)   Hunger Vital Sign    Worried About Running Out of Food in the Last Year: Never true    Ran Out of Food in the Last Year: Never true  Transportation Needs: No Transportation Needs (07/09/2023)   PRAPARE - Administrator, Civil Service (Medical): No    Lack of Transportation (Non-Medical): No  Physical Activity: Insufficiently Active (07/09/2023)    Exercise Vital Sign    Days of Exercise per Week: 3 days    Minutes of Exercise per Session: 30 min  Stress: No Stress Concern Present (07/09/2023)   [employer] of Occupational Health - Occupational Stress Questionnaire    Feeling of Stress : Not at all  Social Connections: Moderately Integrated (07/09/2023)   Social Connection and Isolation Panel    Frequency of Communication with Friends and Family: More than three times a week    Frequency of Social Gatherings with Friends and Family: Three times a week    Attends Religious Services: More than 4 times per year    Active Member of Clubs or Organizations: No    Attends Banker Meetings: Never    Marital Status: Married    Objective:  BP 104/64   Pulse 73   Temp 98.2 F (36.8 C)   Ht 5' 6 (1.676 m)   Wt 154 lb (69.9 kg)   SpO2 96%   BMI 24.86 kg/m      10/27/2023    9:38 AM 09/25/2023    8:48 AM 09/01/2023    7:37 AM  BP/Weight  Systolic BP 104 168 124  Diastolic BP 64 86 70  Wt. (Lbs) 154  158  BMI 24.86 kg/m2  25.5 kg/m2    Physical Exam Vitals reviewed.  Abdominal:   Musculoskeletal:       Back:  Neurological:     Mental Status: She is alert.         Lab Results  Component Value Date   WBC 43.8 (HH) 09/01/2023   HGB 10.4 (L) 09/01/2023   HCT 33.3 (L) 09/01/2023   PLT 260 09/01/2023   GLUCOSE 95 09/01/2023   CHOL 134 09/01/2023   TRIG 159 (H) 09/01/2023   HDL 31 (L) 09/01/2023   LDLCALC 75 09/01/2023   ALT 9 09/01/2023   AST 19 09/01/2023   NA 141 09/01/2023   K 4.7 09/01/2023   CL 109 (H) 09/01/2023   CREATININE 1.65 (H) 09/01/2023   BUN 28 (H) 09/01/2023   CO2 17 (L) 09/01/2023   TSH 0.894 09/01/2023   HGBA1C 5.2 05/05/2023      Assessment & Plan:  Postherpetic neuralgia Assessment & Plan: Recommend increase gabapentin  gradually up to 600 mg three times a day.  Recommended use lidocaine  patches.  May continue to try tylenol .  May try voltaren  gel.    Orders: -     Lidocaine ; Place 3 patches onto the skin daily. Remove & Discard patch within 12 hours or as directed by MD  Dispense: 90 patch; Refill: 2     Meds ordered this encounter  Medications   lidocaine  (LIDODERM ) 5 %    Sig: Place 3 patches onto the skin daily. Remove & Discard patch within 12 hours or as directed by MD    Dispense:  90 patch    Refill:  2    No orders of the defined types were placed in this encounter.    Follow-up: Return if symptoms worsen or fail to improve. An After Visit Summary was printed and given to the patient.  Abigail Free, MD [facility] [phone] "

## 2023-10-29 ENCOUNTER — Ambulatory Visit: Payer: Self-pay

## 2023-10-29 ENCOUNTER — Encounter: Payer: Self-pay | Admitting: Family Medicine

## 2023-10-29 ENCOUNTER — Other Ambulatory Visit: Payer: Self-pay | Admitting: Family Medicine

## 2023-10-29 DIAGNOSIS — B0229 Other postherpetic nervous system involvement: Secondary | ICD-10-CM | POA: Insufficient documentation

## 2023-10-29 MED ORDER — PREGABALIN 75 MG PO CAPS: 75.0000 mg | ORAL_CAPSULE | Freq: Three times a day (TID) | ORAL | 0 refills | Status: DC

## 2023-10-29 NOTE — Telephone Encounter (Signed)
 FYI Only or Action Required?: Action required by provider: clinical question for provider and update on patient condition.  Patient was last seen in primary care on 10/27/2023 by Sherre Clapper, MD.  Called Nurse Triage reporting Medication Reaction.  Symptoms began several days ago.  Interventions attempted: Nothing.  Symptoms are: unchanged.  Triage Disposition: Call PCP Now  Patient/caregiver understands and will follow disposition?: Yes   Copied from CRM [id number]. Topic: Clinical - Red Word Triage >> Oct 29, 2023  3:26 PM Avram MATSU wrote: Red Word that prompted transfer to Nurse Triage: pt stated gabapentin  (NEURONTIN ) 300 MG capsule [[id number]] is causing side effects. She stated it is not helping with her pain.   ----------------------------------------------------------------------- From previous Reason for Contact - Prescription Issue: Reason for CRM: Reason for Disposition  [1] Caller has URGENT medicine question about med that primary care doctor (or NP/PA) or specialist prescribed AND [2] triager unable to answer question  Answer Assessment - Initial Assessment Questions 1. NAME of MEDICINE: What medicine(s) are you calling about?     gabapentin  2. QUESTION: What is your question? (e.g., double dose of medicine, side effect)     Started cough and congestion a couple days ago  3. PRESCRIBER: Who prescribed the medicine? Reason: if prescribed by specialist, call should be referred to that group.     Dr. SHERRE 4. SYMPTOMS: Do you have any symptoms? If Yes, ask: What symptoms are you having?  How bad are the symptoms (e.g., mild, moderate, severe)     States the medication is not helping with the pain from the shingle,   States that Dr. Sherre mentioned changing her medications and wants to know weather to say on the medication.  Protocols used: Medication Question Call-A-AH

## 2023-10-29 NOTE — Patient Instructions (Signed)
 Recommend increase gabapentin  gradually up to 600 mg three times a day.  Recommended use lidocaine  patches.  May continue to try tylenol .  May try voltaren  gel.

## 2023-10-29 NOTE — Assessment & Plan Note (Signed)
 Recommend increase gabapentin  gradually up to 600 mg three times a day.  Recommended use lidocaine  patches.  May continue to try tylenol .  May try voltaren  gel.

## 2023-11-05 ENCOUNTER — Telehealth: Payer: Self-pay

## 2023-11-05 NOTE — Telephone Encounter (Signed)
 Copied from CRM [id number]. Topic: Clinical - Prescription Issue >> Nov 05, 2023  3:58 PM Charlet HERO wrote: Reason for CRM: Patient is calling about script for gabapentin  600mg  was chngd to pregab 75 mg and it is not helpling with the pain she has shingles and would like to have the pregab dosage increased.

## 2023-11-13 ENCOUNTER — Other Ambulatory Visit: Payer: Self-pay | Admitting: Family Medicine

## 2023-11-26 ENCOUNTER — Other Ambulatory Visit: Payer: Self-pay | Admitting: Family Medicine

## 2023-12-15 ENCOUNTER — Encounter: Payer: Self-pay | Admitting: Family Medicine

## 2023-12-15 ENCOUNTER — Ambulatory Visit: Admitting: Family Medicine

## 2023-12-15 VITALS — BP 132/72 | HR 63 | Temp 98.0°F | Resp 16 | Ht 66.0 in | Wt 151.0 lb

## 2023-12-15 DIAGNOSIS — I480 Paroxysmal atrial fibrillation: Secondary | ICD-10-CM | POA: Diagnosis not present

## 2023-12-15 DIAGNOSIS — C9110 Chronic lymphocytic leukemia of B-cell type not having achieved remission: Secondary | ICD-10-CM

## 2023-12-15 DIAGNOSIS — N1832 Chronic kidney disease, stage 3b: Secondary | ICD-10-CM

## 2023-12-15 DIAGNOSIS — E559 Vitamin D deficiency, unspecified: Secondary | ICD-10-CM | POA: Diagnosis not present

## 2023-12-15 DIAGNOSIS — D6861 Antiphospholipid syndrome: Secondary | ICD-10-CM

## 2023-12-15 DIAGNOSIS — I129 Hypertensive chronic kidney disease with stage 1 through stage 4 chronic kidney disease, or unspecified chronic kidney disease: Secondary | ICD-10-CM | POA: Diagnosis not present

## 2023-12-15 DIAGNOSIS — R7303 Prediabetes: Secondary | ICD-10-CM | POA: Diagnosis not present

## 2023-12-15 DIAGNOSIS — E782 Mixed hyperlipidemia: Secondary | ICD-10-CM

## 2023-12-15 DIAGNOSIS — J301 Allergic rhinitis due to pollen: Secondary | ICD-10-CM | POA: Diagnosis not present

## 2023-12-15 DIAGNOSIS — Z23 Encounter for immunization: Secondary | ICD-10-CM | POA: Diagnosis not present

## 2023-12-15 DIAGNOSIS — B0229 Other postherpetic nervous system involvement: Secondary | ICD-10-CM

## 2023-12-15 LAB — POCT LIPID PANEL
HDL: 35
LDL: 64
Non-HDL: 110
TC/HDL: 1.8
TC: 145
TRG: 228

## 2023-12-15 LAB — POCT GLYCOSYLATED HEMOGLOBIN (HGB A1C): Hemoglobin A1C: 5.2 % (ref 4.0–5.6)

## 2023-12-15 MED ORDER — OMEGA-3-ACID ETHYL ESTERS 1 G PO CAPS: 2.0000 g | ORAL_CAPSULE | Freq: Two times a day (BID) | ORAL | 1 refills | Status: AC

## 2023-12-15 MED ORDER — GABAPENTIN 300 MG PO CAPS: 300.0000 mg | ORAL_CAPSULE | Freq: Every day | ORAL | 1 refills | Status: DC

## 2023-12-15 NOTE — Progress Notes (Signed)
 Subjective:  Patient ID: Cynthia Gardner, female    DOB: 1945/12/29  Age: 78 y.o. MRN: [id number]  Chief Complaint  Patient presents with   Medical Management of Chronic Issues    HPI: Discussed the use of AI scribe software for clinical note transcription with the patient, who gave verbal consent to proceed.  History of Present Illness Cynthia Gardner is a 78 year old female with shingles who presents for management of her condition and medication review.  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 - Ongoing pain localized to the back - Pain described as tender with a 'sparking' sensation upon touch - Pain is more pronounced at night, interfering with sleep - Currently taking gabapentin  300 mg at night with effective symptom control - Previously prescribed Lyrica , but prefers gabapentin  due to prescription mix-up  Hypertension management - Takes chlorthalidone  25 mg once daily, metoprolol  50 mg twice daily, and clonidine 0.3 mg (half in the morning, one at night) - No dizziness, headaches, chest pain, or swelling   Antiphospholipid syndrome/paroxysmal atrial fib:   - Eliquis  5 mg twice a day.   Thyroid  hormone replacement - Takes levothyroxine  75 mcg once daily - No symptoms of hypothyroidism or hyperthyroidism reported  Allergic rhinitis symptoms - Experiences runny nose - Uses Zyrtec and Flonase  nasal spray for symptom control - No fevers, chills, sweats, earaches, or sore throat  Musculoskeletal discomfort - Back soreness persists  Weight loss and lifestyle modification - Recent weight loss attributed to healthier eating habits and elimination of sweets - Uses a vibration machine to aid circulation and exercise  Vitamin and supplement use - Takes vitamin C, vitamin D  (once weekly prescription), and potassium (four capsules daily)  CLL: sees oncology. Follows up yearly.  Dr Fernand.   Allergic rhinitis currently on Zyrtec and Flonase .  Patient does complain of some  hearing issues but has not had time to have her ears checked yet.  Diet: healthy Exercise: machine that shakes her.       12/15/2023    7:42 AM 07/09/2023    3:40 PM 05/05/2023    8:28 AM 01/19/2023    7:42 AM 09/29/2022    7:34 AM  Depression screen PHQ 2/9  Decreased Interest 0 0 0 0 0  Down, Depressed, Hopeless 0 0 0 0 0  PHQ - 2 Score 0 0 0 0 0  Altered sleeping 0  1 0 0  Tired, decreased energy 0  1 0 0  Change in appetite 0  0 0 0  Feeling bad or failure about yourself  0  0 0 0  Trouble concentrating 0   0 0  Moving slowly or fidgety/restless 0  0 0 0  Suicidal thoughts 0  0 0 0  PHQ-9 Score 0  2 0 0  Difficult doing work/chores Not difficult at all  Not difficult at all Not difficult at all Not difficult at all        12/15/2023    7:42 AM  Fall Risk   Falls in the past year? 0  Number falls in past yr: 0  Injury with Fall? 0  Risk for fall due to : No Fall Risks  Follow up Falls evaluation completed    Patient Care Team: Sherre Clapper, MD as PCP - General (Family Medicine) Fernand Evans, MD as Referring Physician (Oncology)   Review of Systems  Constitutional:  Negative for chills, fatigue and fever.  HENT:  Negative for congestion, ear pain and sore  throat.   Respiratory:  Negative for cough and shortness of breath.   Cardiovascular:  Negative for chest pain.  Gastrointestinal:  Negative for abdominal pain, constipation, diarrhea, nausea and vomiting.  Genitourinary:  Negative for dysuria and urgency.  Musculoskeletal:  Negative for arthralgias and myalgias.  Skin:  Negative for rash.  Neurological:  Negative for dizziness and headaches.  Psychiatric/Behavioral:  Negative for dysphoric mood. The patient is not nervous/anxious.     Current Outpatient Medications on File Prior to Visit  Medication Sig Dispense Refill   apixaban  (ELIQUIS ) 5 MG TABS tablet TAKE 1 TABLET BY MOUTH TWICE A DAY 60 tablet 2   cetirizine (ZYRTEC) 10 MG tablet TAKE 1 TABLET BY MOUTH  EVERY DAY 90 tablet 3   chlorthalidone  (HYGROTON ) 25 MG tablet TAKE 1 TABLET (25 MG TOTAL) BY MOUTH DAILY. 90 tablet 1   Cholecalciferol (VITAMIN D3 PO) Take 1 tablet by mouth daily.     cloNIDine (CATAPRES) 0.3 MG tablet TAKE 1 TABLET BY MOUTH TWICE A DAY 180 tablet 0   CVS CHEWABLE C WITH ROSE HIPS 500 MG CHEW TAKE 1 TABLET BY MOUTH EVERY DAY 100 tablet 1   diclofenac  Sodium (VOLTAREN ) 1 % GEL Apply four times a day to hip. 1000 g 1   diphenoxylate -atropine  (LOMOTIL ) 2.5-0.025 MG tablet Take 1 tablet by mouth 4 (four) times daily as needed for diarrhea or loose stools. 60 tablet 2   fluticasone  (FLONASE ) 50 MCG/ACT nasal spray SPRAY 2 SPRAYS INTO EACH NOSTRIL EVERY DAY 48 mL 5   folic acid  (FOLVITE ) 1 MG tablet TAKE 1 TABLET BY MOUTH EVERY DAY 90 tablet 1   levothyroxine  (SYNTHROID ) 75 MCG tablet Take 1 tablet (75 mcg total) by mouth daily before breakfast. 90 tablet 3   lidocaine  (LIDODERM ) 5 % Place 3 patches onto the skin daily. Remove & Discard patch within 12 hours or as directed by MD 90 patch 2   metoprolol  tartrate (LOPRESSOR ) 50 MG tablet TAKE 1 TABLET BY MOUTH TWICE A DAY. PT NEEDS TO CALL TO RESCHEDULE FASTING APPT. 180 tablet 1   Multiple Vitamins-Minerals (HAIR SKIN AND NAILS FORMULA) TABS Take by mouth.     Multiple Vitamins-Minerals (ZINC  PO) Take 1 tablet by mouth daily.     nitrofurantoin , macrocrystal-monohydrate, (MACROBID ) 100 MG capsule Take 1 capsule (100 mg total) by mouth 2 (two) times daily. 14 capsule 0   potassium chloride  (MICRO-K ) 10 MEQ CR capsule TAKE 4 CAPSULES BY MOUTH EVERY DAY 360 capsule 1   Vitamin D , Ergocalciferol , (DRISDOL ) 1.25 MG (50000 UNIT) CAPS capsule TAKE 1 CAPSULE BY MOUTH ONE TIME PER WEEK 12 capsule 1   Zinc  Sulfate (ZINC -220 PO) Take by mouth.     No current facility-administered medications on file prior to visit.   Past Medical History:  Diagnosis Date   Antiphospholipid antibody syndrome (HCC)    CVA (cerebral vascular accident) (HCC)  04/06/2019   History of COVID-19    HTN (hypertension) 04/06/2019   Hypothyroidism 04/06/2019   Leukemia (HCC)    Past Surgical History:  Procedure Laterality Date   APPENDECTOMY     CATARACT EXTRACTION Right    COLON RESECTION  2008   ischemic bowel   TONSILLECTOMY      Family History  Problem Relation Age of Onset   Parkinson's disease Father    Cirrhosis Brother        from agent orange   Hypertension Other    Diabetes Other    Social History  Socioeconomic History   Marital status: Married    Spouse name: Carlin   Number of children: 2   Years of education: Not on file   Highest education level: Not on file  Occupational History   Not on file  Tobacco Use   Smoking status: Former   Smokeless tobacco: Never  Vaping Use   Vaping status: Never Used  Substance and Sexual Activity   Alcohol use: Not Currently   Drug use: Never   Sexual activity: Not Currently    Birth control/protection: None  Other Topics Concern   Not on file  Social History Narrative   Not on file   Social Drivers of Health   Financial Resource Strain: Low Risk  (07/09/2023)   Overall Financial Resource Strain (CARDIA)    Difficulty of Paying Living Expenses: Not hard at all  Food Insecurity: No Food Insecurity (07/09/2023)   Hunger Vital Sign    Worried About Running Out of Food in the Last Year: Never true    Ran Out of Food in the Last Year: Never true  Transportation Needs: No Transportation Needs (07/09/2023)   PRAPARE - Administrator, Civil Service (Medical): No    Lack of Transportation (Non-Medical): No  Physical Activity: Insufficiently Active (07/09/2023)   Exercise Vital Sign    Days of Exercise per Week: 3 days    Minutes of Exercise per Session: 30 min  Stress: No Stress Concern Present (07/09/2023)   [employer] of Occupational Health - Occupational Stress Questionnaire    Feeling of Stress : Not at all  Social Connections: Moderately Integrated (07/09/2023)    Social Connection and Isolation Panel    Frequency of Communication with Friends and Family: More than three times a week    Frequency of Social Gatherings with Friends and Family: Three times a week    Attends Religious Services: More than 4 times per year    Active Member of Clubs or Organizations: No    Attends Banker Meetings: Never    Marital Status: Married    Objective:  BP 132/72   Pulse 63   Temp 98 F (36.7 C) (Temporal)   Resp 16   Ht 5' 6 (1.676 m)   Wt 151 lb (68.5 kg)   SpO2 98%   BMI 24.37 kg/m      12/15/2023    7:33 AM 10/27/2023    9:38 AM 09/25/2023    8:48 AM  BP/Weight  Systolic BP 132 104 168  Diastolic BP 72 64 86  Wt. (Lbs) 151 154   BMI 24.37 kg/m2 24.86 kg/m2     Physical Exam Vitals reviewed.  Constitutional:      Appearance: Normal appearance. She is normal weight.  Neck:     Vascular: No carotid bruit.  Cardiovascular:     Rate and Rhythm: Normal rate and regular rhythm.     Heart sounds: Normal heart sounds.  Pulmonary:     Effort: Pulmonary effort is normal. No respiratory distress.     Breath sounds: Normal breath sounds.  Abdominal:     General: Abdomen is flat. Bowel sounds are normal.     Palpations: Abdomen is soft.     Tenderness: There is no abdominal tenderness.  Neurological:     Mental Status: She is alert and oriented to person, place, and time.  Psychiatric:        Mood and Affect: Mood normal.        Behavior: Behavior  normal.         Lab Results  Component Value Date   WBC 43.6 (HH) 12/15/2023   HGB 11.4 12/15/2023   HCT 36.4 12/15/2023   PLT 249 12/15/2023   GLUCOSE 90 12/15/2023   CHOL 134 09/01/2023   TRIG 159 (H) 09/01/2023   HDL 31 (L) 09/01/2023   LDLCALC 75 09/01/2023   ALT 8 12/15/2023   AST 18 12/15/2023   NA 139 12/15/2023   K 4.7 12/15/2023   CL 108 (H) 12/15/2023   CREATININE 1.57 (H) 12/15/2023   BUN 21 12/15/2023   CO2 17 (L) 12/15/2023   TSH 0.894 09/01/2023    HGBA1C 5.2 12/15/2023      Assessment & Plan:  Postherpetic neuralgia Assessment & Plan: Chronic postherpetic neuralgia with nocturnal burning sensation managed with gabapentin . - Prescribe gabapentin  300 mg, 90-day supply, to be taken once at night.  Orders: -     Gabapentin ; Take 1 capsule (300 mg total) by mouth at bedtime.  Dispense: 90 capsule; Refill: 1  Paroxysmal atrial fibrillation [facility]) Assessment & Plan: Managed with Eliquis . - Encourage scheduling an appointment with Dr. Deretha for annual follow-up. - Continue Eliquis  as prescribed.   Antiphospholipid syndrome (HCC) Assessment & Plan: Chronic condition managed with anticoagulation therapy. - Continue anticoagulation therapy with Eliquis .   Hypertensive kidney disease with stage 3b chronic kidney disease (HCC) Assessment & Plan: Hypertension well-controlled with current medication regimen. - Continue current antihypertensive medications: chlorthalidone  25 mg once daily, metoprolol  50 mg twice daily, and clonidine 0.3 mg (half in the morning and one at night).  Orders: -     Comprehensive metabolic panel with GFR  Seasonal allergic rhinitis due to pollen Assessment & Plan: Chronic allergic rhinitis managed with Zyrtec and Flonase . - Continue Zyrtec and Flonase  nasal spray as needed.   Prediabetes Assessment & Plan: Recommend continue to work on eating healthy diet and exercise.   Orders: -     POCT glycosylated hemoglobin (Hb A1C)  Mixed hyperlipidemia -     POCT Lipid Panel -     Omega-3-acid  Ethyl Esters; Take 2 capsules (2 g total) by mouth 2 (two) times daily.  Dispense: 360 capsule; Refill: 1  CLL (chronic lymphocytic leukemia) (HCC) Assessment & Plan: Recommend call to follow up with oncology.  Check cbc.   Orders: -     CBC with Differential/Platelet  Vitamin D  deficiency Assessment & Plan: Continue vitamin D  once weekly.    Immunization due -     Flu vaccine trivalent PF, 6mos and  older(Flulaval,Afluria,Fluarix,Fluzone)     Body mass index is 24.37 kg/m.  Meds ordered this encounter  Medications   gabapentin  (NEURONTIN ) 300 MG capsule    Sig: Take 1 capsule (300 mg total) by mouth at bedtime.    Dispense:  90 capsule    Refill:  1   omega-3 acid ethyl esters (LOVAZA ) 1 g capsule    Sig: Take 2 capsules (2 g total) by mouth 2 (two) times daily.    Dispense:  360 capsule    Refill:  1    Orders Placed This Encounter  Procedures   Flu vaccine trivalent PF, 6mos and older(Flulaval,Afluria,Fluarix,Fluzone)   CBC with Differential/Platelet   Comprehensive metabolic panel with GFR   POCT Lipid Panel   POCT glycosylated hemoglobin (Hb A1C)       Follow-up: Return in about 4 months (around 04/15/2024) for chronic follow up.   An After Visit Summary was printed and given to the  patient.   LILLETTE Kato I Leal-Borjas,acting as a scribe for Abigail Free, MD.,have documented all relevant documentation on the behalf of Abigail Free, MD,as directed by  Abigail Free, MD while in the presence of Abigail Free, MD.   I attest that I have reviewed this visit and agree with the plan scribed by my staff.   Abigail Free, MD [facility] [phone]

## 2023-12-16 ENCOUNTER — Ambulatory Visit: Payer: Self-pay | Admitting: Family Medicine

## 2023-12-16 LAB — COMPREHENSIVE METABOLIC PANEL WITH GFR
ALT: 8 [IU]/L (ref 0–32)
AST: 18 [IU]/L (ref 0–40)
Albumin: 4 g/dL (ref 3.8–4.8)
Alkaline Phosphatase: 87 [IU]/L (ref 49–135)
BUN/Creatinine Ratio: 13 (ref 12–28)
BUN: 21 mg/dL (ref 8–27)
Bilirubin Total: 0.8 mg/dL (ref 0.0–1.2)
CO2: 17 mmol/L — ABNORMAL LOW (ref 20–29)
Calcium: 8.8 mg/dL (ref 8.7–10.3)
Chloride: 108 mmol/L — ABNORMAL HIGH (ref 96–106)
Creatinine, Ser: 1.57 mg/dL — ABNORMAL HIGH (ref 0.57–1.00)
Globulin, Total: 1.9 g/dL (ref 1.5–4.5)
Glucose: 90 mg/dL (ref 70–99)
Potassium: 4.7 mmol/L (ref 3.5–5.2)
Sodium: 139 mmol/L (ref 134–144)
Total Protein: 5.9 g/dL — ABNORMAL LOW (ref 6.0–8.5)
eGFR: 33 mL/min/{1.73_m2} — ABNORMAL LOW

## 2023-12-16 LAB — CBC WITH DIFFERENTIAL/PLATELET
Basophils Absolute: 0.3 10*3/uL — ABNORMAL HIGH (ref 0.0–0.2)
Basos: 1 %
EOS (ABSOLUTE): 1 10*3/uL — ABNORMAL HIGH (ref 0.0–0.4)
Eos: 2 %
Hematocrit: 36.4 % (ref 34.0–46.6)
Hemoglobin: 11.4 g/dL (ref 11.1–15.9)
Immature Grans (Abs): 0 10*3/uL (ref 0.0–0.1)
Immature Granulocytes: 0 %
Lymphocytes Absolute: 32.7 10*3/uL — ABNORMAL HIGH (ref 0.7–3.1)
Lymphs: 75 %
MCH: 30.1 pg (ref 26.6–33.0)
MCHC: 31.3 g/dL — ABNORMAL LOW (ref 31.5–35.7)
MCV: 96 fL (ref 79–97)
Monocytes Absolute: 2.1 10*3/uL — ABNORMAL HIGH (ref 0.1–0.9)
Monocytes: 5 %
Neutrophils Absolute: 7.4 10*3/uL — ABNORMAL HIGH (ref 1.4–7.0)
Neutrophils: 17 %
Platelets: 249 10*3/uL (ref 150–450)
RBC: 3.79 x10E6/uL (ref 3.77–5.28)
RDW: 15.6 % — ABNORMAL HIGH (ref 11.7–15.4)
WBC: 43.6 10*3/uL (ref 3.4–10.8)

## 2023-12-17 ENCOUNTER — Other Ambulatory Visit: Payer: Self-pay | Admitting: Family Medicine

## 2023-12-20 NOTE — Assessment & Plan Note (Signed)
 Managed with Eliquis . - Encourage scheduling an appointment with Dr. Deretha for annual follow-up. - Continue Eliquis  as prescribed.

## 2023-12-20 NOTE — Assessment & Plan Note (Signed)
 Chronic allergic rhinitis managed with Zyrtec and Flonase . - Continue Zyrtec and Flonase  nasal spray as needed.

## 2023-12-20 NOTE — Assessment & Plan Note (Addendum)
 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 - Prescribe gabapentin  300 mg, 90-day supply, to be taken once at night.

## 2023-12-20 NOTE — Assessment & Plan Note (Signed)
 Recommend continue to work on eating healthy diet and exercise.

## 2023-12-20 NOTE — Assessment & Plan Note (Signed)
 Hypertension well-controlled with current medication regimen. - Continue current antihypertensive medications: chlorthalidone  25 mg once daily, metoprolol  50 mg twice daily, and clonidine 0.3 mg (half in the morning and one at night).

## 2023-12-20 NOTE — Assessment & Plan Note (Signed)
 Chronic condition managed with anticoagulation therapy. - Continue anticoagulation therapy with Eliquis .

## 2023-12-20 NOTE — Assessment & Plan Note (Signed)
Continue vitamin D once weekly.

## 2023-12-20 NOTE — Assessment & Plan Note (Signed)
 Recommend call to follow up with oncology.  Check cbc.

## 2023-12-30 ENCOUNTER — Other Ambulatory Visit: Payer: Self-pay | Admitting: Family Medicine

## 2024-01-13 ENCOUNTER — Ambulatory Visit: Payer: Self-pay

## 2024-01-13 ENCOUNTER — Other Ambulatory Visit: Payer: Self-pay

## 2024-01-13 MED ORDER — PREGABALIN 100 MG PO CAPS: 100.0000 mg | ORAL_CAPSULE | Freq: Three times a day (TID) | ORAL | 3 refills | Status: DC

## 2024-01-13 NOTE — Telephone Encounter (Signed)
 Copied from CRM #[id number]. Topic: Clinical - Red Word Triage >> Jan 13, 2024  1:49 PM Zy'onna H wrote: Red Word that prompted transfer to Nurse Triage: Patient having the following symptoms: Shingles - still persisting for over 3 months.  - Having pain Currently on Gabapentin  but wants to inquire for something else.

## 2024-01-13 NOTE — Telephone Encounter (Signed)
 FYI Only or Action Required?: FYI only for provider.  Patient was last seen in primary care on 12/15/2023 by Cynthia Clapper, MD.  Called Nurse Triage reporting Herpes Zoster.  Symptoms began several months ago.  Interventions attempted: Prescription medications: gabapentin .  Symptoms are: unchanged.  Triage Disposition: See PCP Within 2 Weeks  Patient/caregiver understands and will follow disposition?: No, wishes to speak with PCP  Reason for Disposition  Pain persisting > 1 month after rash disappears  Answer Assessment - Initial Assessment Questions No available appts today. Advised UC/ED if symptoms worsen. Offered appt Friday, patient adamantly requests to speak with clinic Nurse, transferred call to [facility].  Patient reports: Already been diagnosed with shingles, no blisters, caught in time gave medication, been having for 3 months, still real sore in belly, still burn, requesting alternative medication.  1. APPEARANCE of RASH: What does the rash look like?      No rash, discolored, can't see anything; now red rash is gone 2. LOCATION: Where is the rash located?      Abdomen, middle back to belly button 3. ONSET: When did the rash start?      3 months ago 4. ITCHING: Does the rash itch? If Yes, ask: How bad is the itch?  (Scale 1-10; or mild, moderate, severe)    mild 5. PAIN: Does the rash hurt? If Yes, ask: How bad is the pain?  (Scale 0-10; or none, mild, moderate, severe)     7/10; tender to the touch, soreness 6. OTHER SYMPTOMS: Do you have any other symptoms? (e.g., fever)     denies  Protocols used: Shingles (Zoster)-A-AH

## 2024-01-13 NOTE — Telephone Encounter (Signed)
 Sent lyrica . Dr. Sherre

## 2024-02-01 ENCOUNTER — Ambulatory Visit: Payer: Self-pay

## 2024-02-01 NOTE — Telephone Encounter (Signed)
 FYI Only or Action Required?: Action required by provider: request for appointment.  Patient was last seen in primary care on 12/15/2023 by Sherre Clapper, MD.  Called Nurse Triage reporting Rash.  Symptoms began several months ago.  Interventions attempted: Prescription medications: multiple prescriptions.  Symptoms are: gradually worsening.  Triage Disposition: See Physician Within 24 Hours  Patient/caregiver understands and will follow disposition?: No, wishes to speak with PCP  FYI: Patient declined triage. Prefers to speak with PCP only. RN offered appt with other providers. Pt refused, says she will only see Dr. Sherre Copied from CRM [id number]. Topic: Clinical - Red Word Triage >> Feb 01, 2024  2:36 PM Rachelle R wrote: [facility] that prompted transfer to Nurse Triage: Patient states she has had singles for 3 months. Has burning and pain around her waist. [phone] Reason for Disposition  SEVERE itching (i.e., interferes with sleep, normal activities or school)  Answer Assessment - Initial Assessment Questions 1. APPEARANCE of RASH: What does the rash look like? (e.g., blisters, dry flaky skin, red spots, redness, sores)     Declined  2. SIZE: How big are the spots? (e.g., tip of pen, eraser, coin; inches, centimeters)     Declined  3. LOCATION: Where is the rash located?     Around waist  4. COLOR: What color is the rash? (Note: It is difficult to assess rash color in people with darker-colored skin. When this situation occurs, simply ask the caller to describe what they see.)     Declined  5. ONSET: When did the rash begin?      July 2025  6. FEVER: Do you have a fever? If Yes, ask: What is your temperature, how was it measured, and when did it start?     No  7. ITCHING: Does the rash itch? If Yes, ask: How bad is the itch? (Scale 1-10; or mild, moderate, severe)     Moderate  8. CAUSE: What do you think is causing the rash?     Shingles  9.  MEDICINE FACTORS: Have you started any new medicines within the last 2 weeks? (e.g., antibiotics)      delcined  10. OTHER SYMPTOMS: Do you have any other symptoms? (e.g., dizziness, headache, sore throat, joint pain)       declined  11. PREGNANCY: Is there any chance you are pregnant? When was your last menstrual period?       No  Protocols used: Rash or Redness - [facility]

## 2024-02-02 ENCOUNTER — Telehealth: Payer: Self-pay

## 2024-02-02 NOTE — Telephone Encounter (Signed)
 Copied from CRM [id number]. Topic: Clinical - Medication Question >> Feb 02, 2024 11:17 AM Olam RAMAN wrote: Reason for CRM: Pt needs an rx for valtrex  for shingles per pharmacy pt stated Cb [phone]

## 2024-02-02 NOTE — Telephone Encounter (Signed)
 Attempted to contact patient to schedule appointment unable to leave message-Memory full.

## 2024-02-04 NOTE — Telephone Encounter (Signed)
 Patient came into the office today requesting to speak with a nurse but left before a staff member could speak with her. Attempted to contact patient today, unable to leave message-Memory Full. Patient needs appointment.

## 2024-02-05 ENCOUNTER — Encounter (HOSPITAL_BASED_OUTPATIENT_CLINIC_OR_DEPARTMENT_OTHER): Payer: Self-pay

## 2024-02-05 ENCOUNTER — Ambulatory Visit (HOSPITAL_BASED_OUTPATIENT_CLINIC_OR_DEPARTMENT_OTHER)
Admission: EM | Admit: 2024-02-05 | Discharge: 2024-02-05 | Disposition: A | Discharge status: Home / Self Care | Attending: Family Medicine | Admitting: Family Medicine

## 2024-02-05 ENCOUNTER — Other Ambulatory Visit (HOSPITAL_BASED_OUTPATIENT_CLINIC_OR_DEPARTMENT_OTHER): Payer: Self-pay

## 2024-02-05 DIAGNOSIS — B0229 Other postherpetic nervous system involvement: Secondary | ICD-10-CM

## 2024-02-05 MED ORDER — VALACYCLOVIR HCL 1 G PO TABS
1000.0000 mg | ORAL_TABLET | Freq: Three times a day (TID) | ORAL | 0 refills | Status: AC
  Filled 2024-02-05: qty 21, 7d supply, fill #0
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
## 2024-02-05 NOTE — Discharge Instructions (Signed)
 Continue with current medications to treat the pain.  Over-the-counter menthol  gels and lidocaine  patches can be helpful.  Cool compresses to the area can be helpful.  You can try over-the-counter capsaicin cream We will try another round of Valtrex  to see if this helps.  Otherwise follow-up with your doctor

## 2024-02-05 NOTE — ED Triage Notes (Signed)
 Patient states dx with shingles in July. Still having nerve pain. Already taking gabapentin  regularly. No active rash. States wonders if she might need to go back on valtrex . Questioning how long pain will last.

## 2024-02-18 ENCOUNTER — Ambulatory Visit: Payer: Self-pay | Admitting: Family Medicine

## 2024-02-18 NOTE — Telephone Encounter (Signed)
 FYI Only or Action Required?: Action required by provider: medication refill request.  Patient was last seen in primary care on 12/15/2023 by Sherre Clapper, MD.  Called Nurse Triage reporting Medication Question.  Symptoms began several months ago.  Interventions attempted: Prescription medications: Pregabalin  100 mg.  Symptoms are: unchanged.  Triage Disposition: Call PCP Now  Patient/caregiver understands and will follow disposition?: Yes Reason for Disposition  [1] Caller has URGENT medicine question about med that primary care doctor (or NP/PA) or specialist prescribed AND [2] triager unable to answer question  Answer Assessment - Initial Assessment Questions Patient states she has 6 pills left and is requesting a refill of Gabapentin  300 mg, not currently on med list. Patient requesting refill asap. Advised refills can take up to 3 days.   1. NAME of MEDICINE: What medicine(s) are you calling about?     Gabapentin  300 mg, Pregabalin  100mg    2. QUESTION: What is your question? (e.g., double dose of medicine, side effect)     Was given Pregabalin  and its not doing anything. Started taking the gabapentin  again due to it working better and wants to continue that  3. PRESCRIBER: Who prescribed the medicine? Reason: if prescribed by specialist, call should be referred to that group.     Clapper Sherre MD  4. SYMPTOMS: Do you have any symptoms? If Yes, ask: What symptoms are you having?  How bad are the symptoms (e.g., mild, moderate, severe)     Nerve pain from shingles, worse at night  Protocols used: Medication Question Call-A-AH  Copied from CRM #[id number]. Topic: Clinical - Medication Question >> Feb 18, 2024  9:05 AM Cynthia Gardner wrote: Reason for CRM: pt was calling about  gabapentin  (NEURONTIN ) 300 MG capsule, pt stated the pre gabin was not working for her

## 2024-02-18 NOTE — Telephone Encounter (Signed)
 Left message for patient to call our office or reply via mychart regarding her gabapentin  dose and if it is helping.

## 2024-02-18 NOTE — Telephone Encounter (Signed)
 Spoke with patient, she states she is currently taking gabapentin  2 in am and 2 at bedtime. Patient states it was helping but when she leans back on something she feels 2 golf ball sized lumps. She states she can mash her back and feel nothing but only when she leans back is when she feels and sees the golgball sized lumps. Patient states Gabapentin  is helping and would like a new RX [facility] to [facility] [address]. Patient states the way she is taking it now is helping and she is tolerating it well.

## 2024-02-19 ENCOUNTER — Other Ambulatory Visit: Payer: Self-pay | Admitting: Family Medicine

## 2024-02-19 MED ORDER — GABAPENTIN 300 MG PO CAPS: 600.0000 mg | ORAL_CAPSULE | Freq: Two times a day (BID) | ORAL | 2 refills | Status: DC

## 2024-02-25 ENCOUNTER — Other Ambulatory Visit: Payer: Self-pay | Admitting: Family Medicine

## 2024-02-28 ENCOUNTER — Other Ambulatory Visit: Payer: Self-pay | Admitting: Family Medicine

## 2024-03-26 ENCOUNTER — Other Ambulatory Visit: Payer: Self-pay | Admitting: Family Medicine

## 2024-03-27 ENCOUNTER — Other Ambulatory Visit: Payer: Self-pay | Admitting: Family Medicine

## 2024-04-11 ENCOUNTER — Ambulatory Visit: Payer: Self-pay

## 2024-04-11 NOTE — Telephone Encounter (Signed)
 FYI Only or Action Required?: FYI only for provider: appointment scheduled on 1/13.  Patient was last seen in primary care on 12/15/2023 by Sherre Clapper, MD.  Called Nurse Triage reporting Ear Fullness.  Symptoms began several days ago.  Interventions attempted: Rest, hydration, or home remedies.  Symptoms are: gradually worsening.  Triage Disposition: See PCP When Office is Open (Within 3 Days)  Patient/caregiver understands and will follow disposition?: Yes  Copied from CRM #[id number]. Topic: Clinical - Medical Advice >> Apr 11, 2024 10:20 AM Donna BRAVO wrote: Reason for CRM:   Patient would like to know if there is something she can do until her appt on 04/18/24  left ear ache  unable to hear out of it feels like there is fluid in ear  asking for virtual appt Reason for Disposition  [1] Ear congestion lasts > 3 days AND [2] no improvement after using Care Advice  (Exception: Ear congestion is a chronic symptom.)  Answer Assessment - Initial Assessment Questions A few days ago - left ear- Started with high pitched sound. Extra loud (more than her baseline tinnitus), feels like she is talking in a drum, Popping and cracking sound with blowing nose and Swallowing like at high altitude. Fluid in ears Earache not as bad- stopped up and feeling full Hurts occasionally (7/10 when it does) Denies drainage, fever, or neck stiffness, or dizziness.  Not really sinus congestion- is blowing stuff out of nose but no congestion sx. Almost like an allergy. Peroxide did not help.   Video Visit made per pt request with PCP. Ed/uc precautions understood.   1. LOCATION: Which ear is involved?       Left ear 2. SENSATION: Describe how the ear feels. (e.g., stuffy, full, plugged).      Stopped up  3. ONSET:  When did the ear symptoms start?       A few days  4. PAIN: Do you also have an earache? If Yes, ask: How bad is it? (Scale 0-10; none, mild, moderate or severe)     7/10 when  ear ache is present  5. CAUSE: What do you think is causing the ear congestion? (e.g., common cold, nasal allergies, recent flight, recent snorkeling)     Runny nose (clear) 6. OTHER SYMPTOMS: Do you have any other symptoms? (e.g., ear drainage, hay fever symptoms such as sneezing or a clear nasal discharge; cold symptoms such as a cough or runny nose)     Runny nose (clear), worsened tinnitus (resolved).  Protocols used: Ear - Congestion-A-AH

## 2024-04-12 ENCOUNTER — Telehealth (INDEPENDENT_AMBULATORY_CARE_PROVIDER_SITE_OTHER): Admitting: Family Medicine

## 2024-04-12 VITALS — BP 118/72 | HR 78 | Temp 98.0°F | Ht 66.0 in | Wt 151.0 lb

## 2024-04-12 DIAGNOSIS — N1832 Chronic kidney disease, stage 3b: Secondary | ICD-10-CM | POA: Diagnosis not present

## 2024-04-12 DIAGNOSIS — C9110 Chronic lymphocytic leukemia of B-cell type not having achieved remission: Secondary | ICD-10-CM | POA: Diagnosis not present

## 2024-04-12 DIAGNOSIS — I129 Hypertensive chronic kidney disease with stage 1 through stage 4 chronic kidney disease, or unspecified chronic kidney disease: Secondary | ICD-10-CM | POA: Diagnosis not present

## 2024-04-12 DIAGNOSIS — H6122 Impacted cerumen, left ear: Secondary | ICD-10-CM

## 2024-04-12 DIAGNOSIS — I480 Paroxysmal atrial fibrillation: Secondary | ICD-10-CM

## 2024-04-12 DIAGNOSIS — J301 Allergic rhinitis due to pollen: Secondary | ICD-10-CM

## 2024-04-12 DIAGNOSIS — H65192 Other acute nonsuppurative otitis media, left ear: Secondary | ICD-10-CM | POA: Diagnosis not present

## 2024-04-12 DIAGNOSIS — B0229 Other postherpetic nervous system involvement: Secondary | ICD-10-CM | POA: Diagnosis not present

## 2024-04-12 MED ORDER — CEFDINIR 300 MG PO CAPS: 300.0000 mg | ORAL_CAPSULE | Freq: Two times a day (BID) | ORAL | 0 refills | Status: AC

## 2024-04-12 MED ORDER — GABAPENTIN 100 MG PO CAPS: 100.0000 mg | ORAL_CAPSULE | Freq: Three times a day (TID) | ORAL | 0 refills | Status: AC

## 2024-04-12 NOTE — Assessment & Plan Note (Addendum)
 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 - Prescribe gabapentin  100 mg three times a day , but can wean off.  Orders:   gabapentin  (NEURONTIN ) 100 MG capsule; Take 1 capsule (100 mg total) by mouth 3 (three) times daily.

## 2024-04-12 NOTE — Assessment & Plan Note (Addendum)
 Managed with Eliquis . - Encourage scheduling an appointment with Dr. Deretha for annual follow-up. - Continue Eliquis  as prescribed.

## 2024-04-12 NOTE — Assessment & Plan Note (Addendum)
 Recommend call to follow up with oncology.  Check cbc.

## 2024-04-12 NOTE — Progress Notes (Signed)
 "  Acute Office Visit  Subjective:    Patient ID: Cynthia Gardner, female    DOB: Oct 16, 1945, 79 y.o.   MRN: [id number]  Chief Complaint  Patient presents with   URI     Discussed the use of AI scribe software for clinical note transcription with the patient, who gave verbal consent to proceed.  History of Present Illness Cynthia Gardner is a 79 year old female who presents with left ear fullness and ringing.  Left ear fullness and auditory symptoms - Sensation of fullness in the left ear for the past 2-3 days - Associated with popping and cracking sounds when swallowing - Describes sensation as similar to changes in air pressure experienced in the [place] - Significant episode of tinnitus at approximately 3:00 AM, loud enough to cause awakening, likened to 'the TV station on nothing but static' - Tinnitus episode has resolved, but persistent sensation of ear fullness remains - Ear feels as if full of fluid  Constitutional and respiratory symptoms - No fever, chills, or sweats - No sore throat, cough, or shortness of breath  Current medications and symptom management - Using Flonase , initially two puffs in the morning - Currently using one puff in the morning and one at night each nostril  Patient has appointment coming up next week and would like to get labs drawn for next week since I am concerned about her weight /frailness.   Past Medical History:  Diagnosis Date   Antiphospholipid antibody syndrome    CVA (cerebral vascular accident) (HCC) 04/06/2019   History of COVID-19    HTN (hypertension) 04/06/2019   Hypothyroidism 04/06/2019   Leukemia (HCC)     Past Surgical History:  Procedure Laterality Date   APPENDECTOMY     CATARACT EXTRACTION Right    COLON RESECTION  2008   ischemic bowel   TONSILLECTOMY      Family History  Problem Relation Age of Onset   Parkinson's disease Father    Cirrhosis Brother        from agent orange   Hypertension Other    Diabetes Other      Social History   Socioeconomic History   Marital status: Married    Spouse name: Charles   Number of children: 2   Years of education: Not on file   Highest education level: Not on file  Occupational History   Not on file  Tobacco Use   Smoking status: Former   Smokeless tobacco: Never  Vaping Use   Vaping status: Never Used  Substance and Sexual Activity   Alcohol use: Not Currently   Drug use: Never   Sexual activity: Not Currently    Birth control/protection: None  Other Topics Concern   Not on file  Social History Narrative   Not on file   Social Drivers of Health   Tobacco Use: Medium Risk (04/17/2024)   Patient History    Smoking Tobacco Use: Former    Smokeless Tobacco Use: Never    Passive Exposure: Not on Actuary Strain: Low Risk (07/09/2023)   Overall Financial Resource Strain (CARDIA)    Difficulty of Paying Living Expenses: Not hard at all  Food Insecurity: No Food Insecurity (07/09/2023)   Hunger Vital Sign    Worried About Running Out of Food in the Last Year: Never true    Ran Out of Food in the Last Year: Never true  Transportation Needs: No Transportation Needs (07/09/2023)   PRAPARE - Transportation  Lack of Transportation (Medical): No    Lack of Transportation (Non-Medical): No  Physical Activity: Insufficiently Active (07/09/2023)   Exercise Vital Sign    Days of Exercise per Week: 3 days    Minutes of Exercise per Session: 30 min  Stress: No Stress Concern Present (07/09/2023)   [employer] of Occupational Health - Occupational Stress Questionnaire    Feeling of Stress : Not at all  Social Connections: Moderately Integrated (07/09/2023)   Social Connection and Isolation Panel    Frequency of Communication with Friends and Family: More than three times a week    Frequency of Social Gatherings with Friends and Family: Three times a week    Attends Religious Services: More than 4 times per year    Active Member of Clubs  or Organizations: No    Attends Banker Meetings: Never    Marital Status: Married  Catering Manager Violence: Not At Risk (07/09/2023)   Humiliation, Afraid, Rape, and Kick questionnaire    Fear of Current or Ex-Partner: No    Emotionally Abused: No    Physically Abused: No    Sexually Abused: No  Depression (PHQ2-9): Low Risk (12/15/2023)   Depression (PHQ2-9)    PHQ-2 Score: 0  Alcohol Screen: Low Risk (07/09/2023)   Alcohol Screen    Last Alcohol Screening Score (AUDIT): 0  Housing: Low Risk (09/01/2023)   Housing Stability Vital Sign    Unable to Pay for Housing in the Last Year: No    Number of Times Moved in the Last Year: 0    Homeless in the Last Year: No  Utilities: Not At Risk (07/09/2023)   AHC Utilities    Threatened with loss of utilities: No  Health Literacy: Adequate Health Literacy (07/09/2023)   B1300 Health Literacy    Frequency of need for help with medical instructions: Never    Outpatient Medications Prior to Visit  Medication Sig Dispense Refill   cetirizine (ZYRTEC) 10 MG tablet TAKE 1 TABLET BY MOUTH EVERY DAY 90 tablet 3   chlorthalidone  (HYGROTON ) 25 MG tablet TAKE 1 TABLET (25 MG TOTAL) BY MOUTH DAILY. 90 tablet 1   Cholecalciferol (VITAMIN D3 PO) Take 1 tablet by mouth daily.     cloNIDine (CATAPRES) 0.3 MG tablet TAKE 1 TABLET BY MOUTH TWICE A DAY 180 tablet 0   [facility] CHEWABLE C WITH ROSE HIPS 500 MG CHEW TAKE 1 TABLET BY MOUTH EVERY DAY 100 tablet 1   diclofenac  Sodium (VOLTAREN ) 1 % GEL Apply four times a day to hip. 1000 g 1   diphenoxylate -atropine  (LOMOTIL ) 2.5-0.025 MG tablet Take 1 tablet by mouth 4 (four) times daily as needed for diarrhea or loose stools. 60 tablet 2   ELIQUIS  5 MG TABS tablet TAKE 1 TABLET BY MOUTH TWICE A DAY 60 tablet 2   fluticasone  (FLONASE ) 50 MCG/ACT nasal spray SPRAY 2 SPRAYS INTO EACH NOSTRIL EVERY DAY 48 mL 5   folic acid  (FOLVITE ) 1 MG tablet TAKE 1 TABLET BY MOUTH EVERY DAY 90 tablet 1   levothyroxine   (SYNTHROID ) 75 MCG tablet Take 1 tablet (75 mcg total) by mouth daily before breakfast. 90 tablet 3   lidocaine  (LIDODERM ) 5 % Place 3 patches onto the skin daily. Remove & Discard patch within 12 hours or as directed by MD 90 patch 2   metoprolol  tartrate (LOPRESSOR ) 50 MG tablet TAKE 1 TABLET BY MOUTH TWICE A DAY. PT NEEDS TO CALL TO RESCHEDULE FASTING APPT. 180 tablet 1  Multiple Vitamins-Minerals (HAIR SKIN AND NAILS FORMULA) TABS Take by mouth.     Multiple Vitamins-Minerals (ZINC  PO) Take 1 tablet by mouth daily.     nitrofurantoin , macrocrystal-monohydrate, (MACROBID ) 100 MG capsule Take 1 capsule (100 mg total) by mouth 2 (two) times daily. 14 capsule 0   omega-3 acid ethyl esters (LOVAZA ) 1 g capsule Take 2 capsules (2 g total) by mouth 2 (two) times daily. 360 capsule 1   potassium chloride  (MICRO-K ) 10 MEQ CR capsule TAKE 4 CAPSULES BY MOUTH EVERY DAY 360 capsule 1   Vitamin D , Ergocalciferol , (DRISDOL ) 1.25 MG (50000 UNIT) CAPS capsule TAKE 1 CAPSULE BY MOUTH ONE TIME PER WEEK 12 capsule 1   Zinc  Sulfate (ZINC -220 PO) Take by mouth.     gabapentin  (NEURONTIN ) 300 MG capsule Take 2 capsules (600 mg total) by mouth 2 (two) times daily. 120 capsule 2   No facility-administered medications prior to visit.    Allergies[1]  Review of Systems  Constitutional:  Negative for chills and fever.  HENT:  Positive for congestion, ear pain (fullness), postnasal drip and tinnitus. Negative for sore throat.   Respiratory:  Negative for cough and shortness of breath.   Cardiovascular:  Negative for chest pain.       Objective:        04/12/2024   11:33 AM 02/05/2024   11:10 AM 12/15/2023    7:33 AM  Vitals with BMI  Height 5' 6  5' 6  Weight 151 lbs  151 lbs  BMI 24.38  24.38  Systolic 118 116 867  Diastolic 72 76 72  Pulse 78 72 63    No data found.   Physical Exam Vitals reviewed.  Constitutional:      Appearance: Normal appearance.  HENT:     Right Ear: Tympanic  membrane, ear canal and external ear normal.     Left Ear: There is impacted cerumen (partial). Tympanic membrane is erythematous.     Nose: Congestion present.     Comments: Green mucous    Mouth/Throat:     Pharynx: Oropharynx is clear.  Cardiovascular:     Rate and Rhythm: Normal rate and regular rhythm.     Heart sounds: Normal heart sounds. No murmur heard. Pulmonary:     Effort: Pulmonary effort is normal. No respiratory distress.     Breath sounds: Normal breath sounds.  Lymphadenopathy:     Cervical: No cervical adenopathy.  Neurological:     Mental Status: She is alert and oriented to person, place, and time.  Psychiatric:        Mood and Affect: Mood normal.        Behavior: Behavior normal.     Health Maintenance Due  Topic Date Due   DTaP/Tdap/Td (1 - Tdap) Never done   Zoster Vaccines- Shingrix (1 of 2) Never done   COVID-19 Vaccine (5 - 2025-26 season) 11/30/2023    There are no preventive care reminders to display for this patient.   Lab Results  Component Value Date   TSH 0.894 09/01/2023   Lab Results  Component Value Date   WBC 41.5 (HH) 04/12/2024   HGB 11.5 04/12/2024   HCT 36.1 04/12/2024   MCV 96 04/12/2024   PLT 312 04/12/2024   Lab Results  Component Value Date   NA 142 04/12/2024   K 4.5 04/12/2024   CO2 18 (L) 04/12/2024   GLUCOSE 115 (H) 04/12/2024   BUN 26 04/12/2024   CREATININE 1.54 (H) 04/12/2024  BILITOT 0.8 04/12/2024   ALKPHOS 100 04/12/2024   AST 23 04/12/2024   ALT 14 04/12/2024   PROT 6.4 04/12/2024   ALBUMIN 4.2 04/12/2024   CALCIUM  8.9 04/12/2024   ANIONGAP 10 04/18/2019   EGFR 34 (L) 04/12/2024   Lab Results  Component Value Date   CHOL 134 09/01/2023   Lab Results  Component Value Date   HDL 31 (L) 09/01/2023   Lab Results  Component Value Date   LDLCALC 75 09/01/2023   Lab Results  Component Value Date   TRIG 159 (H) 09/01/2023   Lab Results  Component Value Date   CHOLHDL 4.3 09/01/2023   Lab  Results  Component Value Date   HGBA1C 5.2 12/15/2023        Results for orders placed or performed in visit on 04/12/24  CBC with Differential/Platelet   Collection Time: 04/12/24 12:02 PM  Result Value Ref Range   WBC 41.5 (HH) 3.4 - 10.8 x10E3/uL   RBC 3.77 3.77 - 5.28 x10E6/uL   Hemoglobin 11.5 11.1 - 15.9 g/dL   Hematocrit 63.8 65.9 - 46.6 %   MCV 96 79 - 97 fL   MCH 30.5 26.6 - 33.0 pg   MCHC 31.9 31.5 - 35.7 g/dL   RDW 83.3 (H) 88.2 - 84.5 %   Platelets 312 150 - 450 x10E3/uL   Neutrophils 16 Not Estab. %   Lymphs 72 Not Estab. %   Monocytes 9 Not Estab. %   Eos 2 Not Estab. %   Basos 1 Not Estab. %   Neutrophils Absolute 6.7 1.4 - 7.0 x10E3/uL   Lymphocytes Absolute 29.7 (H) 0.7 - 3.1 x10E3/uL   Monocytes Absolute 3.8 (H) 0.1 - 0.9 x10E3/uL   EOS (ABSOLUTE) 0.9 (H) 0.0 - 0.4 x10E3/uL   Basophils Absolute 0.3 (H) 0.0 - 0.2 x10E3/uL   Immature Granulocytes 0 Not Estab. %   Immature Grans (Abs) 0.0 0.0 - 0.1 x10E3/uL   Hematology Comments: Note:   Comprehensive metabolic panel with GFR   Collection Time: 04/12/24 12:02 PM  Result Value Ref Range   Glucose 115 (H) 70 - 99 mg/dL   BUN 26 8 - 27 mg/dL   Creatinine, Ser 8.45 (H) 0.57 - 1.00 mg/dL   eGFR 34 (L) >40 fO/fpw/8.26   BUN/Creatinine Ratio 17 12 - 28   Sodium 142 134 - 144 mmol/L   Potassium 4.5 3.5 - 5.2 mmol/L   Chloride 105 96 - 106 mmol/L   CO2 18 (L) 20 - 29 mmol/L   Calcium  8.9 8.7 - 10.3 mg/dL   Total Protein 6.4 6.0 - 8.5 g/dL   Albumin 4.2 3.8 - 4.8 g/dL   Globulin, Total 2.2 1.5 - 4.5 g/dL   Bilirubin Total 0.8 0.0 - 1.2 mg/dL   Alkaline Phosphatase 100 49 - 135 IU/L   AST 23 0 - 40 IU/L   ALT 14 0 - 32 IU/L     Assessment & Plan:   Assessment & Plan Postherpetic neuralgia 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 - Prescribe gabapentin  100 mg three times a day , but can wean off.  Orders:   gabapentin  (NEURONTIN ) 100 MG capsule; Take 1  capsule (100 mg total) by mouth 3 (three) times daily.   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   Orders:   CBC with Differential/Platelet   Comprehensive metabolic panel with GFR   CLL (chronic lymphocytic  leukemia) (HCC) Recommend call to follow up with oncology.  Check cbc.      Paroxysmal atrial fibrillation (HCC) Managed with Eliquis  and metoprolol . - Encourage scheduling an appointment with Dr. Deretha for annual follow-up. - Continue Eliquis  as prescribed.     Hearing loss of left ear due to cerumen impaction Ear successfully irrigated.  Orders:   Ear Lavage  Other non-recurrent acute nonsuppurative otitis media of left ear Prescription: cefdinir  given.  Orders:   cefdinir  (OMNICEF ) 300 MG capsule; Take 1 capsule (300 mg total) by mouth 2 (two) times daily.  Seasonal allergic rhinitis due to pollen Nasal congestion and drainage. Currently using Flonase . - Continue Flonase , one spray each nostril twice daily.       Body mass index is 24.37 kg/m..   Meds ordered this encounter  Medications   cefdinir  (OMNICEF ) 300 MG capsule    Sig: Take 1 capsule (300 mg total) by mouth 2 (two) times daily.    Dispense:  20 capsule    Refill:  0   gabapentin  (NEURONTIN ) 100 MG capsule    Sig: Take 1 capsule (100 mg total) by mouth 3 (three) times daily.    Dispense:  90 capsule    Refill:  0    Orders Placed This Encounter  Procedures   CBC with Differential/Platelet   Comprehensive metabolic panel with GFR   Ear Lavage     I,Marla I Leal-Borjas,acting as a scribe for Abigail Free, MD.,have documented all relevant documentation on the behalf of Abigail Free, MD,as directed by  Abigail Free, MD while in the presence of Abigail Free, MD.   Follow-up: No follow-ups on file.  An After Visit Summary was printed and given to the patient.  I attest that I have reviewed this visit and agree with the plan scribed  by my staff.   Abigail Free, MD [facility] [phone]       [1] No Known Allergies  "

## 2024-04-12 NOTE — Assessment & Plan Note (Addendum)
 Check lab and see you next week.   Orders:   CBC with Differential/Platelet   Comprehensive metabolic panel with GFR

## 2024-04-13 ENCOUNTER — Ambulatory Visit: Payer: Self-pay | Admitting: Family Medicine

## 2024-04-13 LAB — CBC WITH DIFFERENTIAL/PLATELET
Basophils Absolute: 0.3 x10E3/uL — ABNORMAL HIGH (ref 0.0–0.2)
Basos: 1 %
EOS (ABSOLUTE): 0.9 x10E3/uL — ABNORMAL HIGH (ref 0.0–0.4)
Eos: 2 %
Hematocrit: 36.1 % (ref 34.0–46.6)
Hemoglobin: 11.5 g/dL (ref 11.1–15.9)
Immature Grans (Abs): 0 x10E3/uL (ref 0.0–0.1)
Immature Granulocytes: 0 %
Lymphocytes Absolute: 29.7 x10E3/uL — ABNORMAL HIGH (ref 0.7–3.1)
Lymphs: 72 %
MCH: 30.5 pg (ref 26.6–33.0)
MCHC: 31.9 g/dL (ref 31.5–35.7)
MCV: 96 fL (ref 79–97)
Monocytes Absolute: 3.8 x10E3/uL — ABNORMAL HIGH (ref 0.1–0.9)
Monocytes: 9 %
Neutrophils Absolute: 6.7 x10E3/uL (ref 1.4–7.0)
Neutrophils: 16 %
Platelets: 312 x10E3/uL (ref 150–450)
RBC: 3.77 x10E6/uL (ref 3.77–5.28)
RDW: 16.6 % — ABNORMAL HIGH (ref 11.7–15.4)
WBC: 41.5 x10E3/uL (ref 3.4–10.8)

## 2024-04-13 LAB — COMPREHENSIVE METABOLIC PANEL WITH GFR
ALT: 14 IU/L (ref 0–32)
AST: 23 IU/L (ref 0–40)
Albumin: 4.2 g/dL (ref 3.8–4.8)
Alkaline Phosphatase: 100 IU/L (ref 49–135)
BUN/Creatinine Ratio: 17 (ref 12–28)
BUN: 26 mg/dL (ref 8–27)
Bilirubin Total: 0.8 mg/dL (ref 0.0–1.2)
CO2: 18 mmol/L — ABNORMAL LOW (ref 20–29)
Calcium: 8.9 mg/dL (ref 8.7–10.3)
Chloride: 105 mmol/L (ref 96–106)
Creatinine, Ser: 1.54 mg/dL — ABNORMAL HIGH (ref 0.57–1.00)
Globulin, Total: 2.2 g/dL (ref 1.5–4.5)
Glucose: 115 mg/dL — ABNORMAL HIGH (ref 70–99)
Potassium: 4.5 mmol/L (ref 3.5–5.2)
Sodium: 142 mmol/L (ref 134–144)
Total Protein: 6.4 g/dL (ref 6.0–8.5)
eGFR: 34 mL/min/1.73 — ABNORMAL LOW

## 2024-04-15 ENCOUNTER — Ambulatory Visit: Admitting: Family Medicine

## 2024-04-16 NOTE — Assessment & Plan Note (Signed)
 Nasal congestion and drainage. Currently using Flonase . - Continue Flonase , one spray each nostril twice daily.

## 2024-04-17 ENCOUNTER — Encounter: Payer: Self-pay | Admitting: Family Medicine

## 2024-04-18 ENCOUNTER — Ambulatory Visit: Admitting: Family Medicine

## 2024-04-18 ENCOUNTER — Encounter: Payer: Self-pay | Admitting: Family Medicine

## 2024-04-18 VITALS — BP 118/68 | HR 77 | Temp 98.0°F | Ht 66.0 in | Wt 151.0 lb

## 2024-04-18 DIAGNOSIS — I480 Paroxysmal atrial fibrillation: Secondary | ICD-10-CM

## 2024-04-18 DIAGNOSIS — N1832 Chronic kidney disease, stage 3b: Secondary | ICD-10-CM | POA: Diagnosis not present

## 2024-04-18 DIAGNOSIS — I129 Hypertensive chronic kidney disease with stage 1 through stage 4 chronic kidney disease, or unspecified chronic kidney disease: Secondary | ICD-10-CM | POA: Diagnosis not present

## 2024-04-18 DIAGNOSIS — E782 Mixed hyperlipidemia: Secondary | ICD-10-CM

## 2024-04-18 DIAGNOSIS — E038 Other specified hypothyroidism: Secondary | ICD-10-CM | POA: Diagnosis not present

## 2024-04-18 DIAGNOSIS — R7303 Prediabetes: Secondary | ICD-10-CM | POA: Diagnosis not present

## 2024-04-18 DIAGNOSIS — C9110 Chronic lymphocytic leukemia of B-cell type not having achieved remission: Secondary | ICD-10-CM

## 2024-04-18 DIAGNOSIS — H65192 Other acute nonsuppurative otitis media, left ear: Secondary | ICD-10-CM | POA: Diagnosis not present

## 2024-04-18 DIAGNOSIS — D6861 Antiphospholipid syndrome: Secondary | ICD-10-CM | POA: Diagnosis not present

## 2024-04-18 DIAGNOSIS — H6690 Otitis media, unspecified, unspecified ear: Secondary | ICD-10-CM | POA: Insufficient documentation

## 2024-04-18 LAB — POCT LIPID PANEL
HDL: 31
LDL: 106
Non-HDL: 128
TC: 159
TRG: 114

## 2024-04-18 LAB — POCT GLYCOSYLATED HEMOGLOBIN (HGB A1C): HbA1c POC (<> result, manual entry): 5.5 %

## 2024-04-18 MED ORDER — ATORVASTATIN CALCIUM 10 MG PO TABS: 10.0000 mg | ORAL_TABLET | Freq: Every day | ORAL | 3 refills | Status: AC

## 2024-04-18 NOTE — Assessment & Plan Note (Signed)
 Recommend continue to work on eating healthy diet and exercise.  Orders:   POCT glycosylated hemoglobin (Hb A1C)

## 2024-04-18 NOTE — Assessment & Plan Note (Signed)
 Trial on atorvastatin .  Recommend coenzyme q10. Recommend continue to work on eating healthy diet and exercise.  Orders:   POCT Lipid Panel   atorvastatin  (LIPITOR) 10 MG tablet; Take 1 tablet (10 mg total) by mouth daily.

## 2024-04-18 NOTE — Assessment & Plan Note (Signed)
 Chronic condition managed with anticoagulation therapy. - Continue anticoagulation therapy with Eliquis .

## 2024-04-18 NOTE — Assessment & Plan Note (Signed)
 Hypertension well-controlled with current medication regimen. - Continue current antihypertensive medications: chlorthalidone  25 mg once daily, metoprolol  50 mg twice daily, and clonidine half in the morning and one at night

## 2024-04-18 NOTE — Patient Instructions (Addendum)
" °  VISIT SUMMARY: Today, we addressed your hearing loss, blood pressure management, and several other ongoing health issues. Your ear condition has improved with antibiotics, but hearing loss persists. Your blood pressure is stable, and we discussed your medications for various conditions.  YOUR PLAN: OTITIS MEDIA WITH HEARING LOSS: You have an ear infection that has improved, but you still have hearing loss. -Continue taking your current antibiotics. Hearing may take weeks to improve after the infection.  SHINGLES WITH POSTHERPETIC NEURALGIA: You have persistent nerve pain from a past shingles infection. -Continue taking gabapentin  300 mg at night for one more week, then start to taper off the medication.  HYPERTENSION: Your blood pressure is well-controlled with your current medications. -Continue taking chlorthalidone , metoprolol , and clonidine as prescribed.  ATRIAL FIBRILLATION: Your atrial fibrillation is managed with Eliquis  and metoprolol . -Continue taking Eliquis  5 mg twice a day and metoprolol  as prescribed.  ANTIPHOSPHOLIPID SYNDROME: Your condition is managed with Eliquis . -Continue taking Eliquis  5 mg twice a day.  HYPOTHYROIDISM: Your thyroid  condition is managed with levothyroxine . -Continue taking levothyroxine  75 mcg daily.  MIXED HYPERLIPIDEMIA: Your cholesterol levels need improvement. -Start taking a low-dose statin. Lipitor 10 mg before bed  -Take coenzyme Q10 to prevent muscle pain. -Follow up in 3 months to reassess your cholesterol levels.  PREDIABETES: Your blood sugar levels are under good control. -Continue monitoring your blood sugar levels and maintain a healthy diet and exercise routine.    Contains text generated by [employer].   "

## 2024-04-18 NOTE — Assessment & Plan Note (Signed)
Well controlled.  ?No changes to medicines.  ?Continue to work on eating a healthy diet and exercise.  ?Labs drawn today.  ?

## 2024-04-18 NOTE — Assessment & Plan Note (Signed)
 Improved. Complete antibiotic.

## 2024-04-18 NOTE — Assessment & Plan Note (Signed)
 Managed with Eliquis  and metoprolol . Management per specialist.

## 2024-04-18 NOTE — Progress Notes (Signed)
 "  Subjective:  Patient ID: Cynthia Gardner, female    DOB: 1945/07/26  Age: 79 y.o. MRN: [id number]  No chief complaint on file.   HPI: Discussed the use of AI scribe software for clinical note transcription with the patient, who gave verbal consent to proceed.  History of Present Illness Cynthia Gardner is a 79 year old female with chronic ear issues and hypertension who presents with hearing loss and follow-up on blood pressure management.  Hearing loss and otologic symptoms - Ear issues with significant improvement since starting antibiotics - Persistent hearing loss despite improvement in ear condition - Hearing difficulties exacerbated after her dog ate her hearing aid - Unable to hear anything during church attendance yesterday - No earaches, sore throat, or stuffy nose  Hypertension - Blood pressure has been stable - Currently taking chlorthalidone , metoprolol , and clonidine (0.3 mg, half in the morning and one at night) for blood pressure management - No chest pain, shortness of breath, or headaches  Atrial fibrillation and antiphospholipid syndrome - Managing atrial fibrillation and antiphospholipid syndrome with Eliquis  5 mg twice daily and metoprolol   Thyroid  dysfunction - On levothyroxine  75 mcg for thyroid  management  Allergic rhinitis - Takes Zyrtec and Flonase  for allergies  Postherpetic neuralgia - History of shingles with persistent back pain described as a burning sensation, exacerbated by clothing - Currently taking gabapentin  300 mg at night for pain control and has new prescription for 100 mg three times a day to try to wean.   Chronic lymphocytic leukemia (cll) - Under care of Dr. Fernand for CLL - Recent blood work blood count, chemistry panel (liver and kidney function) and thyroid .  - White blood cell count changed from 43 to 41 - Plans to follow up with hematology soon  Constitutional and systemic symptoms - No fevers, chills, sweats, chest pain, shortness  of breath, coughing, abdominal pain, bowel problems, nausea, vomiting, bladder issues, depression, anxiety, or loss of interest in activities     12/15/2023    7:42 AM 07/09/2023    3:40 PM 05/05/2023    8:28 AM 01/19/2023    7:42 AM 09/29/2022    7:34 AM  Depression screen PHQ 2/9  Decreased Interest 0 0 0 0 0  Down, Depressed, Hopeless 0 0 0 0 0  PHQ - 2 Score 0 0 0 0 0  Altered sleeping 0  1 0 0  Tired, decreased energy 0  1 0 0  Change in appetite 0  0 0 0  Feeling bad or failure about yourself  0  0 0 0  Trouble concentrating 0   0 0  Moving slowly or fidgety/restless 0  0 0 0  Suicidal thoughts 0  0 0 0  PHQ-9 Score 0   2  0  0   Difficult doing work/chores Not difficult at all  Not difficult at all Not difficult at all Not difficult at all     Data saved with a previous flowsheet row definition        12/15/2023    7:42 AM  Fall Risk   Falls in the past year? 0  Number falls in past yr: 0  Injury with Fall? 0   Risk for fall due to : No Fall Risks  Follow up Falls evaluation completed     Data saved with a previous flowsheet row definition    Patient Care Team: Sherre Clapper, MD as PCP - General (Family Medicine) Fernand Evans, MD as Referring Physician (  Oncology)   Review of Systems  Constitutional:  Negative for chills, fatigue and fever.  HENT:  Positive for ear pain. Negative for congestion and sore throat.   Respiratory:  Negative for cough and shortness of breath.   Cardiovascular:  Negative for chest pain.  Gastrointestinal:  Negative for abdominal pain, constipation, diarrhea, nausea and vomiting.  Genitourinary:  Negative for dysuria and urgency.  Musculoskeletal:  Negative for arthralgias and myalgias.  Neurological:  Negative for dizziness and headaches.  Psychiatric/Behavioral:  Negative for dysphoric mood. The patient is not nervous/anxious.     Medications Ordered Prior to Encounter[1] Past Medical History:  Diagnosis Date   Antiphospholipid  antibody syndrome    CVA (cerebral vascular accident) (HCC) 04/06/2019   History of COVID-19    HTN (hypertension) 04/06/2019   Hypothyroidism 04/06/2019   Leukemia (HCC)    Past Surgical History:  Procedure Laterality Date   APPENDECTOMY     CATARACT EXTRACTION Right    COLON RESECTION  2008   ischemic bowel   TONSILLECTOMY      Family History  Problem Relation Age of Onset   Parkinson's disease Father    Cirrhosis Brother        from agent orange   Hypertension Other    Diabetes Other    Social History   Socioeconomic History   Marital status: Married    Spouse name: Charles   Number of children: 2   Years of education: Not on file   Highest education level: Not on file  Occupational History   Not on file  Tobacco Use   Smoking status: Former   Smokeless tobacco: Never  Vaping Use   Vaping status: Never Used  Substance and Sexual Activity   Alcohol use: Not Currently   Drug use: Never   Sexual activity: Not Currently    Birth control/protection: None  Other Topics Concern   Not on file  Social History Narrative   Not on file   Social Drivers of Health   Tobacco Use: Medium Risk (04/17/2024)   Patient History    Smoking Tobacco Use: Former    Smokeless Tobacco Use: Never    Passive Exposure: Not on Actuary Strain: Low Risk (07/09/2023)   Overall Financial Resource Strain (CARDIA)    Difficulty of Paying Living Expenses: Not hard at all  Food Insecurity: No Food Insecurity (04/18/2024)   Epic    Worried About Programme Researcher, Broadcasting/film/video in the Last Year: Never true    Ran Out of Food in the Last Year: Never true  Transportation Needs: No Transportation Needs (04/18/2024)   Epic    Lack of Transportation (Medical): No    Lack of Transportation (Non-Medical): No  Physical Activity: Insufficiently Active (07/09/2023)   Exercise Vital Sign    Days of Exercise per Week: 3 days    Minutes of Exercise per Session: 30 min  Stress: No Stress Concern Present  (07/09/2023)   [employer] of Occupational Health - Occupational Stress Questionnaire    Feeling of Stress : Not at all  Social Connections: Moderately Integrated (07/09/2023)   Social Connection and Isolation Panel    Frequency of Communication with Friends and Family: More than three times a week    Frequency of Social Gatherings with Friends and Family: Three times a week    Attends Religious Services: More than 4 times per year    Active Member of Clubs or Organizations: No    Attends Banker  Meetings: Never    Marital Status: Married  Depression (PHQ2-9): Low Risk (12/15/2023)   Depression (PHQ2-9)    PHQ-2 Score: 0  Alcohol Screen: Low Risk (07/09/2023)   Alcohol Screen    Last Alcohol Screening Score (AUDIT): 0  Housing: Low Risk (04/18/2024)   Epic    Unable to Pay for Housing in the Last Year: No    Number of Times Moved in the Last Year: 0    Homeless in the Last Year: No  Utilities: Not At Risk (04/18/2024)   Epic    Threatened with loss of utilities: No  Health Literacy: Adequate Health Literacy (07/09/2023)   B1300 Health Literacy    Frequency of need for help with medical instructions: Never    Objective:  BP 118/68   Pulse 77   Temp 98 F (36.7 C)   Ht 5' 6 (1.676 m)   Wt 151 lb (68.5 kg)   SpO2 98%   BMI 24.37 kg/m      04/18/2024    8:04 AM 04/12/2024   11:33 AM 02/05/2024   11:10 AM  BP/Weight  Systolic BP 118 118 116  Diastolic BP 68 72 76  Wt. (Lbs) 151 151   BMI 24.37 kg/m2 24.37 kg/m2     Physical Exam Vitals reviewed.  Constitutional:      Appearance: Normal appearance. She is normal weight.  HENT:     Right Ear: Tympanic membrane normal.     Left Ear: Tympanic membrane normal.  Neck:     Vascular: No carotid bruit.  Cardiovascular:     Rate and Rhythm: Normal rate and regular rhythm.     Heart sounds: Normal heart sounds.  Pulmonary:     Effort: Pulmonary effort is normal. No respiratory distress.     Breath sounds:  Normal breath sounds.  Abdominal:     General: Abdomen is flat. Bowel sounds are normal.     Palpations: Abdomen is soft.     Tenderness: There is no abdominal tenderness.  Neurological:     Mental Status: She is alert and oriented to person, place, and time.  Psychiatric:        Mood and Affect: Mood normal.        Behavior: Behavior normal.         Lab Results  Component Value Date   WBC 41.5 (HH) 04/12/2024   HGB 11.5 04/12/2024   HCT 36.1 04/12/2024   PLT 312 04/12/2024   GLUCOSE 115 (H) 04/12/2024   CHOL 134 09/01/2023   TRIG 159 (H) 09/01/2023   HDL 31 (L) 09/01/2023   LDLCALC 75 09/01/2023   ALT 14 04/12/2024   AST 23 04/12/2024   NA 142 04/12/2024   K 4.5 04/12/2024   CL 105 04/12/2024   CREATININE 1.54 (H) 04/12/2024   BUN 26 04/12/2024   CO2 18 (L) 04/12/2024   TSH 0.894 09/01/2023   HGBA1C 5.5 04/18/2024    Results for orders placed or performed in visit on 04/18/24  POCT glycosylated hemoglobin (Hb A1C)   Collection Time: 04/18/24  8:22 AM  Result Value Ref Range   Hemoglobin A1C     HbA1c POC (<> result, manual entry) 5.5 4.0 - 5.6 %   HbA1c, POC (prediabetic range)     HbA1c, POC (controlled diabetic range)    POCT Lipid Panel   Collection Time: 04/18/24  8:22 AM  Result Value Ref Range   TC 159    HDL 31  TRG 114    LDL 106    Non-HDL 128    TC/HDL    .  Assessment & Plan:   Assessment & Plan Prediabetes Recommend continue to work on eating healthy diet and exercise.  Orders:   POCT glycosylated hemoglobin (Hb A1C)   Mixed hyperlipidemia Trial on atorvastatin .  Recommend coenzyme q10. Recommend continue to work on eating healthy diet and exercise.  Orders:   POCT Lipid Panel   atorvastatin  (LIPITOR) 10 MG tablet; Take 1 tablet (10 mg total) by mouth daily.   Antiphospholipid syndrome Chronic condition managed with anticoagulation therapy. - Continue anticoagulation therapy with Eliquis .     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 - Continue current antihypertensive medications: chlorthalidone  25 mg once daily, metoprolol  50 mg twice daily, and clonidine half in the morning and one at night     CLL (chronic lymphocytic leukemia) (HCC) Recommend call to follow up with oncology.  Wbc stable     Paroxysmal atrial fibrillation (HCC) Managed with Eliquis  and metoprolol . Management per specialist.       Other non-recurrent acute nonsuppurative otitis media of left ear Improved. Complete antibiotic.       Body mass index is 24.37 kg/m.    Meds ordered this encounter  Medications   atorvastatin  (LIPITOR) 10 MG tablet    Sig: Take 1 tablet (10 mg total) by mouth daily.    Dispense:  90 tablet    Refill:  3    Orders Placed This Encounter  Procedures   POCT glycosylated hemoglobin (Hb A1C)   POCT Lipid Panel       Follow-up: Return in about 3 months (around 07/17/2024).  An After Visit Summary was printed and given to the patient.  Abigail Free, MD [facility] [phone]     [1]  Current Outpatient Medications on File Prior to Visit  Medication Sig Dispense Refill   cefdinir  (OMNICEF ) 300 MG capsule Take 1 capsule (300 mg total) by mouth 2 (two) times daily. 20 capsule 0   cetirizine (ZYRTEC) 10 MG tablet TAKE 1 TABLET BY MOUTH EVERY DAY 90 tablet 3   chlorthalidone  (HYGROTON ) 25 MG tablet TAKE 1 TABLET (25 MG TOTAL) BY MOUTH DAILY. 90 tablet 1   Cholecalciferol (VITAMIN D3 PO) Take 1 tablet by mouth daily.     cloNIDine (CATAPRES) 0.3 MG tablet TAKE 1 TABLET BY MOUTH TWICE A DAY 180 tablet 0   [facility] CHEWABLE C WITH ROSE HIPS 500 MG CHEW TAKE 1 TABLET BY MOUTH EVERY DAY 100 tablet 1   diclofenac  Sodium (VOLTAREN ) 1 % GEL Apply four times a day to hip. 1000 g 1    diphenoxylate -atropine  (LOMOTIL ) 2.5-0.025 MG tablet Take 1 tablet by mouth 4 (four) times daily as needed for diarrhea or loose stools. 60 tablet 2   ELIQUIS  5 MG TABS tablet TAKE 1 TABLET BY MOUTH TWICE A DAY 60 tablet 2   fluticasone  (FLONASE ) 50 MCG/ACT nasal spray SPRAY 2 SPRAYS INTO EACH NOSTRIL EVERY DAY 48 mL 5   folic acid  (FOLVITE ) 1 MG tablet TAKE 1 TABLET BY MOUTH EVERY DAY 90 tablet 1   gabapentin  (NEURONTIN ) 100 MG capsule Take 1 capsule (100 mg total) by mouth 3 (three) times daily. 90 capsule 0   levothyroxine  (SYNTHROID ) 75 MCG tablet Take 1 tablet (75 mcg total) by  mouth daily before breakfast. 90 tablet 3   lidocaine  (LIDODERM ) 5 % Place 3 patches onto the skin daily. Remove & Discard patch within 12 hours or as directed by MD 90 patch 2   metoprolol  tartrate (LOPRESSOR ) 50 MG tablet TAKE 1 TABLET BY MOUTH TWICE A DAY. PT NEEDS TO CALL TO RESCHEDULE FASTING APPT. 180 tablet 1   Multiple Vitamins-Minerals (HAIR SKIN AND NAILS FORMULA) TABS Take by mouth.     Multiple Vitamins-Minerals (ZINC  PO) Take 1 tablet by mouth daily.     nitrofurantoin , macrocrystal-monohydrate, (MACROBID ) 100 MG capsule Take 1 capsule (100 mg total) by mouth 2 (two) times daily. 14 capsule 0   omega-3 acid ethyl esters (LOVAZA ) 1 g capsule Take 2 capsules (2 g total) by mouth 2 (two) times daily. 360 capsule 1   potassium chloride  (MICRO-K ) 10 MEQ CR capsule TAKE 4 CAPSULES BY MOUTH EVERY DAY 360 capsule 1   Vitamin D , Ergocalciferol , (DRISDOL ) 1.25 MG (50000 UNIT) CAPS capsule TAKE 1 CAPSULE BY MOUTH ONE TIME PER WEEK 12 capsule 1   Zinc  Sulfate (ZINC -220 PO) Take by mouth.     No current facility-administered medications on file prior to visit.   "

## 2024-04-18 NOTE — Assessment & Plan Note (Signed)
 Previously well controlled Continue Synthroid at current dose

## 2024-04-18 NOTE — Assessment & Plan Note (Signed)
 Recommend call to follow up with oncology.  Wbc stable

## 2024-04-20 ENCOUNTER — Other Ambulatory Visit: Payer: Self-pay | Admitting: Family Medicine

## 2024-07-14 ENCOUNTER — Ambulatory Visit: Payer: Self-pay

## 2024-08-12 ENCOUNTER — Ambulatory Visit: Admitting: Family Medicine
